# Patient Record
Sex: Female | Born: 1955
Health system: Southern US, Community
[De-identification: ages and names within clinical notes are randomized; demographics above are authoritative.]

## PROBLEM LIST (undated history)

## (undated) ENCOUNTER — Ambulatory Visit: Payer: PPO

## (undated) DIAGNOSIS — F329 Major depressive disorder, single episode, unspecified: Secondary | ICD-10-CM

## (undated) DIAGNOSIS — F419 Anxiety disorder, unspecified: Secondary | ICD-10-CM

## (undated) DIAGNOSIS — C4491 Basal cell carcinoma of skin, unspecified: Secondary | ICD-10-CM

## (undated) DIAGNOSIS — C801 Malignant (primary) neoplasm, unspecified: Secondary | ICD-10-CM

## (undated) DIAGNOSIS — I10 Essential (primary) hypertension: Secondary | ICD-10-CM

## (undated) DIAGNOSIS — K219 Gastro-esophageal reflux disease without esophagitis: Secondary | ICD-10-CM

## (undated) DIAGNOSIS — F32A Depression, unspecified: Secondary | ICD-10-CM

## (undated) DIAGNOSIS — J45909 Unspecified asthma, uncomplicated: Secondary | ICD-10-CM

## (undated) DIAGNOSIS — M549 Dorsalgia, unspecified: Secondary | ICD-10-CM

## (undated) HISTORY — DX: Major depressive disorder, single episode, unspecified: F32.9

## (undated) HISTORY — PX: SHOULDER OPEN ROTATOR CUFF REPAIR: SHX2407

## (undated) HISTORY — PX: ABDOMINAL HYSTERECTOMY: SHX81

## (undated) HISTORY — DX: Depression, unspecified: F32.A

## (undated) HISTORY — PX: TUBAL LIGATION: SHX77

## (undated) HISTORY — DX: Unspecified asthma, uncomplicated: J45.909

## (undated) HISTORY — PX: ARTHROSCOPY WITH ANTERIOR CRUCIATE LIGAMENT (ACL) REPAIR WITH ANTERIOR TIBILIAS GRAFT: SHX6503

## (undated) HISTORY — PX: SHOULDER ARTHROSCOPY W/ ROTATOR CUFF REPAIR: SHX2400

## (undated) HISTORY — PX: FOOT SURGERY: SHX648

## (undated) HISTORY — DX: Basal cell carcinoma of skin, unspecified: C44.91

## (undated) HISTORY — DX: Essential (primary) hypertension: I10

## (undated) HISTORY — PX: BACK SURGERY: SHX140

## (undated) SURGERY — Surgical Case
Anesthesia: *Unknown

---

## 1999-05-24 ENCOUNTER — Encounter: Payer: Self-pay | Admitting: Internal Medicine

## 1999-05-24 ENCOUNTER — Ambulatory Visit (HOSPITAL_COMMUNITY): Admission: RE | Admit: 1999-05-24 | Discharge: 1999-05-24 | Payer: Self-pay | Admitting: Internal Medicine

## 2000-08-02 ENCOUNTER — Other Ambulatory Visit: Admission: RE | Admit: 2000-08-02 | Discharge: 2000-08-02 | Payer: Self-pay | Admitting: Internal Medicine

## 2000-12-03 ENCOUNTER — Emergency Department (HOSPITAL_COMMUNITY): Admission: EM | Admit: 2000-12-03 | Discharge: 2000-12-03 | Payer: Self-pay | Admitting: Emergency Medicine

## 2000-12-20 ENCOUNTER — Other Ambulatory Visit: Admission: RE | Admit: 2000-12-20 | Discharge: 2000-12-20 | Payer: Self-pay | Admitting: Internal Medicine

## 2005-09-05 ENCOUNTER — Ambulatory Visit: Payer: Self-pay | Admitting: Family Medicine

## 2005-10-17 ENCOUNTER — Ambulatory Visit: Payer: Self-pay | Admitting: Family Medicine

## 2005-12-19 ENCOUNTER — Ambulatory Visit: Payer: Self-pay | Admitting: Gastroenterology

## 2005-12-20 ENCOUNTER — Ambulatory Visit: Payer: Self-pay | Admitting: *Deleted

## 2005-12-29 ENCOUNTER — Ambulatory Visit: Payer: Self-pay | Admitting: Gastroenterology

## 2006-01-12 ENCOUNTER — Encounter (INDEPENDENT_AMBULATORY_CARE_PROVIDER_SITE_OTHER): Payer: Self-pay | Admitting: Specialist

## 2006-01-12 ENCOUNTER — Ambulatory Visit: Payer: Self-pay | Admitting: Gastroenterology

## 2006-02-01 ENCOUNTER — Emergency Department (HOSPITAL_COMMUNITY): Admission: EM | Admit: 2006-02-01 | Discharge: 2006-02-01 | Payer: Self-pay | Admitting: Emergency Medicine

## 2006-02-28 ENCOUNTER — Ambulatory Visit: Payer: Self-pay | Admitting: Gastroenterology

## 2006-04-14 ENCOUNTER — Emergency Department (HOSPITAL_COMMUNITY): Admission: EM | Admit: 2006-04-14 | Discharge: 2006-04-14 | Payer: Self-pay | Admitting: Emergency Medicine

## 2007-04-15 ENCOUNTER — Ambulatory Visit: Payer: Self-pay | Admitting: Physical Medicine & Rehabilitation

## 2007-04-17 ENCOUNTER — Encounter (INDEPENDENT_AMBULATORY_CARE_PROVIDER_SITE_OTHER): Payer: Self-pay | Admitting: Orthopedic Surgery

## 2007-04-18 ENCOUNTER — Inpatient Hospital Stay (HOSPITAL_COMMUNITY): Admission: RE | Admit: 2007-04-18 | Discharge: 2007-04-20 | Payer: Self-pay | Admitting: Orthopedic Surgery

## 2007-06-03 ENCOUNTER — Encounter: Admission: RE | Admit: 2007-06-03 | Discharge: 2007-06-21 | Payer: Self-pay | Admitting: Orthopedic Surgery

## 2010-10-04 NOTE — Op Note (Signed)
[name], [name]              ACCOUNT NO.:  [id number]   MEDICAL RECORD NO.:  [id number]          PATIENT TYPE:  AMB   LOCATION:  DAY                          FACILITY:  [facility]   PHYSICIAN:  Alvy Beal, MD    DATE OF BIRTH:  08-Jul-1955   DATE OF PROCEDURE:  04/17/2007  DATE OF DISCHARGE:                               OPERATIVE REPORT   PREOPERATIVE DIAGNOSIS:  Lumbar spinal stenosis L4-5, L5-S1 with L5  synovial cyst with mass effect causing radicular leg pain.   POSTOPERATIVE DIAGNOSIS:  Lumbar spinal stenosis L4-5, L5-S1 with L5  synovial cyst with mass effect causing radicular leg pain.   OPERATIVE PROCEDURE:  L4-5, L5-S1 lumbar decompression with L5-S1  instrumented fusion with spine Frontier facet screws.   SURGEON:  Alvy Beal, MD   FIRST ASSISTANT:  Jene Every, M.D.   COMPLICATIONS:  None.   CONDITION:  Stable.   HISTORY:  She is a very pleasant, 55 year old morbidly obese woman who  is having significant back and bilateral leg pain with the left side  being worse than right.  Attempts at preoperative pain management  consisting of physical therapy and injection therapy have failed, but  she did get relief with the stent injections.  Her MRI demonstrated a  large synovial cyst at L5-S1 and stenosis and moderate stenosis at L4-5.  After discussing treatment options, we elected to proceed with lumbar  decompression and fusion.  All appropriate risks, benefits and  alternatives of surgery were discussed with the patient.  Consent was  obtained.   OPERATIVE NOTE:  The patient was brought to the operating room, placed  supine on the operating table.  After successful induction of general  anesthesia with endotracheal intubation, SCDs and Foley were applied.  The patient was turned prone onto a Wilson frame.  Arms were placed  overhead and the back was prepped and draped in standard fashion.  A  generous midline incision was then made.  Sharp dissection  was carried  out through a very substantial adipose subcutaneous tissue down to the  deep fascia.  The fascia was sharply incised the paraspinal muscles were  stripped using a Cobb elevator to expose the L4-5, L5-S1 laminar spaces.  The deep retractor blades were positioned and the L5 lamina was  identified with the use of fluoroscopy.  Once we confirmed we had the  appropriate level, I then used a double-action Leksell rongeur to resect  the entire L5 spinous process and perform a generous laminectomy  bilaterally.  I also resected some of the L4 spinous process and  performed the laminotomy.  Using a fine curette, I developed a plane  underneath the  ligamentum flavum, dissecting the dura using a  combination of 3 and 4 mm Kerrison.  I resected a thickened ligamentum  flavum and then completed the laminectomy of L5.  I then proceeded  cranially and performed a laminotomy of L4.  Once I had the central  decompression complete, I then went down on the right-hand side to the  lateral recess.  The thecal sac was protected using a 3-mm  Kerrison  rongeur.  I resected the thickened ligamentum flavum in the L4-5, L5-S1  lateral recess and was able to palpate out to the medial or medial wall  of the pedicle.  On the left side, I started L4 and proceeded  inferiorly. I noted to be a very large synovial cyst causing significant  mass effect on the thecal sac as well as the traversing S1 nerve root.  I then developed a plane between the cyst and the facet and resected the  inferior L5 facet.  This allowed me to free up the synovial cyst and I  resected it after carefully dissecting off the thecal sac and nerve  root.  This was sent to pathology.   Once the cyst was decompressed, I then went out onto the neural foramen  at S1,  and freed that up as well.  With a generous bilateral  foraminotomy at L5 completed I was able to take a dural elevator and  pass it along the lateral recess out the neural  foramen and without any  significant compression.  Because the amount of decompression and facet  trauma that was cause while resecting the facet, I elected to instrument  fuse the level.  Using fluoroscopy, I drilled across the sac using a 3.5  drilled and into the S1 pedicle and I decorticated the facet complex and  packed it with regional bone that I harvested from the decompression.   The wound was then copiously irrigated with normal saline.  I then  closed the deep fascia with interrupted #1 Vicryl sutures and a running  0 Vicryl suture and 2-0 Vicryl sutures and staples.  A dry dressing was  applied.  The patient was extubated, transferred to the PACU without  incident.  At the end of the case, all needle, sponge counts were  correct.      Alvy Beal, MD  Electronically Signed     DDB/MEDQ  D:  04/17/2007  T:  04/18/2007  Job:  [id number]

## 2010-10-04 NOTE — Discharge Summary (Signed)
[name], [name]              ACCOUNT NO.:  [id number]   MEDICAL RECORD NO.:  [id number]          PATIENT TYPE:  INP   LOCATION:  1523                         FACILITY:  [facility]   PHYSICIAN:  Alvy Beal, MD    DATE OF BIRTH:  [birth year]   DATE OF ADMISSION:  04/18/2007  DATE OF DISCHARGE:  04/20/2007                               DISCHARGE SUMMARY   ADMISSION DIAGNOSIS:  Lumbar spinal stenosis L4-L5, L5-S1 with L5  synovial cyst with mass effect causing radicular leg pain.   DISCHARGE DIAGNOSIS:  Lumbar spinal stenosis L4-L5, L5-S1 with L5  synovial cyst with mass effect causing radicular leg pain.   SERVICE:  Alvy Beal, MD.   PROCEDURE:  L4-L5, L5-S1 lumbar decompression with L5-S1 instrument  effusion with spine frontier facet screws.   HISTORY:  Ms. [name] is a pleasant 55 year old morbidly obese woman who  was seen in our office and had a significant history of significant back  pain with bilateral leg pain with the left side being worse than the  right.  Attempts at preoperative pain management consisting of physical  therapy and injection therapy had failed.  Her MRI preoperatively  demonstrated a large synovial cyst at the L5-S1 level and stenosis at  the L4-L5 level.  After discussing treatment options, the patient  elected to proceed with lumbar decompression and fusion.  All  appropriate risks, benefits, and alternatives to surgery were discussed  with the patient and consent was obtained.   HOSPITAL COURSE:  The patient tolerated the operative procedure well.  She was transferred from the operating room to the PACU in stable  condition.  She was transferred from the PACU to the floor in stable  condition.  Postoperatively day 1, the patient was ambulating with a  brace.  The patient was discharged home in stable condition on April 20, 2007.   DISCHARGE INSTRUCTIONS:  1. The patient was instructed to follow up in the office in 2 weeks.  2. The  patient was given preprinted discharge instructions.   DISCHARGE MEDICATIONS:  1. Percocet 5/325.  2. Robaxin 500 mg.  3. She continued her current medications of Detrol LA, __________ ,      lisinopril, hydrochlorothiazide, Effexor, __________ .  4. She was informed to stop taking her Celebrex and the Vicodin      10/650s.      Crissie Reese, PA      Alvy Beal, MD  Electronically Signed    AC/MEDQ  D:  05/25/2007  T:  05/25/2007  Job:  [id number]

## 2010-10-07 NOTE — Assessment & Plan Note (Signed)
[name]                           GASTROENTEROLOGY OFFICE NOTE   NAME:[name], [name]                     MRN:          [id number]  DATE:12/19/2005                            DOB:          February 13, 1956    REFERRING PHYSICIAN:  Dr. Gordan Payment at [facility].   PROBLEM:  Lower abdominal pain.   HISTORY:  [name] is a pleasant 55 year old white female who had been seen by  Dr. Jarold Edwards remotely and was diagnosed with irritable bowel syndrome.  She  had undergone colonoscopy in 1997, which was a normal exam.  She had also  had a CT scan during that workup because of complaints of left-sided  abdominal pain and was noted to have some diverticulosis but no evidence of  diverticulitis.   Patient relates that over the past couple of months, she has had fairly  constant left lower quadrant discomfort.  This seems to be increased with  need for bowel movement and also increased somewhat post prandially.  She  complains of bloating and misery after meals.  She has also had long-term  mild constipation but says that this has been somewhat worse over the past  couple of months as well.  She has been using Cascara tablets three daily,  and usually with this will have a bowel movement every other day, although  not always a good bowel movement.  She has not noted any melena or  hematochezia, has had no fevers or chills.  Her weight is up over the past  couple of months, although she reports a decrease in appetite.   Patient also reports intermittent dysphagia with choking.  She says that  sometimes she will choke just with liquids and other times will have  difficulty with solids, feeling as if they are hanging in her esophagus.  She is not having any regurgitation.  Denies any heartburn or indigestion.   CURRENT MEDICATIONS:  1.  Lisinopril 10 p.o. daily.  2.  Effexor XR 150 daily.  3.  Detrol LA 4 mg daily.  4.  Prempro 0.625  daily.   ALLERGIES:  No known drug allergies.   PAST MEDICAL HISTORY:  1.  She is status post hysterectomy and bilateral tubal ligation.  Ovaries      are intact.  2.  Hypertension.  3.  Depression.  4.  Stress urinary incontinence.   FAMILY HISTORY:  A strong family history for colon cancer in three maternal  cousins, one of whom is deceased secondary to colon cancer. All were  diagnosed in their late 70s, early 64s.  Father with heart disease and  diabetes.  One first cousin with ovarian cancer and one first cousin with  breast cancer, all on the maternal side.   SOCIAL HISTORY:  Patient is married.  Has two children.  She is employed in  clerical work.  She is a nonsmoker.  Does drink alcohol socially, perhaps  six beers per week.   REVIEW OF SYSTEMS:  Pertinent for stress urinary incontinence, fatigue, low  back pain, and cough.  GI:  As above.   PHYSICAL EXAMINATION:  VITAL SIGNS:  Height is 5 foot 3.  Weight is 249.  Blood pressure 128/82, pulse 78.  GENERAL:  A well-developed white female in no acute distress.  HEENT:  Atraumatic and normocephalic.  EOMI.  PERRLA.  Sclerae are  anicteric.  PULMONARY:  Clear experienced the onset A&P.  CARDIOVASCULAR:  Regular rate and rhythm with S1 and S2.  ABDOMEN:  Soft, large.  She is tender in the left mid quadrant and left  lower quadrant.  There is no rebound or guarding.  There is fullness in the  left lower quadrant.  Question mass effect.  Bowel sounds are active.  RECTAL:  Heme negative and without mass.   IMPRESSION:  47.  A 55 year old white female with a two month history of left lower      quadrant and constipation, rule out diverticulitis.  Rule out occult      colon lesion.  2.  Positive family history of colon cancer, maternal side.  3.  Dysphagia, intermittent, rule out stricture.   PLAN:  1.  Schedule CT scan of the abdomen and pelvis with contrast.  2.  Start Flagyl 500 b.i.d. and Cipro 500 mg b.i.d. x10 days to  treat for      possible diverticulitis.  3.  Schedule colonoscopy in 2-3 weeks so that she will have time to complete      a course of antibiotics and CT scan can be reviewed.  Will also schedule      simultaneous EGD and possible dilation.                                   Amy Esterwood, PA-C                                Karina Rea. Jarold Motto, MD, Clementeen Graham, FACP   AE/MedQ  DD:  12/19/2005  DT:  12/19/2005  Job #:  [id number]   cc:   Dr. Gordan Payment

## 2011-02-28 LAB — BASIC METABOLIC PANEL
BUN: 13
BUN: 8
CO2: 27
Chloride: 103
Chloride: 105
Creatinine, Ser: 0.76
GFR calc Af Amer: >60
GFR calc Af Amer: >60
GFR calc non Af Amer: >60
Glucose, Bld: 153 — ABNORMAL HIGH
Glucose, Bld: 92
Potassium: 4
Potassium: 4.2
Sodium: 136
Sodium: 143

## 2011-02-28 LAB — CBC
Hemoglobin: 10.6 — ABNORMAL LOW
MCV: 95.7
RDW: 14.5

## 2011-02-28 LAB — HEMOGLOBIN AND HEMATOCRIT, BLOOD: HCT: 41.1

## 2012-05-29 DIAGNOSIS — K760 Fatty (change of) liver, not elsewhere classified: Secondary | ICD-10-CM | POA: Insufficient documentation

## 2014-07-21 DIAGNOSIS — Z79890 Hormone replacement therapy (postmenopausal): Secondary | ICD-10-CM | POA: Insufficient documentation

## 2015-04-01 DIAGNOSIS — M5090 Cervical disc disorder, unspecified, unspecified cervical region: Secondary | ICD-10-CM | POA: Insufficient documentation

## 2015-04-01 DIAGNOSIS — E785 Hyperlipidemia, unspecified: Secondary | ICD-10-CM | POA: Insufficient documentation

## 2015-04-01 DIAGNOSIS — F329 Major depressive disorder, single episode, unspecified: Secondary | ICD-10-CM | POA: Insufficient documentation

## 2015-04-01 DIAGNOSIS — M519 Unspecified thoracic, thoracolumbar and lumbosacral intervertebral disc disorder: Secondary | ICD-10-CM | POA: Insufficient documentation

## 2015-04-01 DIAGNOSIS — I10 Essential (primary) hypertension: Secondary | ICD-10-CM | POA: Insufficient documentation

## 2015-04-01 DIAGNOSIS — F32A Depression, unspecified: Secondary | ICD-10-CM | POA: Insufficient documentation

## 2015-04-01 DIAGNOSIS — H269 Unspecified cataract: Secondary | ICD-10-CM | POA: Insufficient documentation

## 2017-03-19 DIAGNOSIS — M7522 Bicipital tendinitis, left shoulder: Secondary | ICD-10-CM | POA: Insufficient documentation

## 2017-03-19 DIAGNOSIS — M25512 Pain in left shoulder: Secondary | ICD-10-CM | POA: Insufficient documentation

## 2017-03-19 DIAGNOSIS — E663 Overweight: Secondary | ICD-10-CM | POA: Insufficient documentation

## 2017-08-15 ENCOUNTER — Emergency Department (INDEPENDENT_AMBULATORY_CARE_PROVIDER_SITE_OTHER): Payer: BLUE CROSS/BLUE SHIELD

## 2017-08-15 ENCOUNTER — Other Ambulatory Visit: Payer: Self-pay

## 2017-08-15 ENCOUNTER — Emergency Department
Admission: EM | Admit: 2017-08-15 | Discharge: 2017-08-15 | Disposition: A | Discharge status: Home / Self Care | Payer: BLUE CROSS/BLUE SHIELD | Source: Home / Self Care | Attending: Family Medicine | Admitting: Family Medicine

## 2017-08-15 ENCOUNTER — Encounter: Payer: Self-pay | Admitting: *Deleted

## 2017-08-15 DIAGNOSIS — R05 Cough: Secondary | ICD-10-CM

## 2017-08-15 DIAGNOSIS — R062 Wheezing: Secondary | ICD-10-CM | POA: Diagnosis not present

## 2017-08-15 DIAGNOSIS — J9801 Acute bronchospasm: Secondary | ICD-10-CM

## 2017-08-15 DIAGNOSIS — R0600 Dyspnea, unspecified: Secondary | ICD-10-CM

## 2017-08-15 DIAGNOSIS — B9789 Other viral agents as the cause of diseases classified elsewhere: Secondary | ICD-10-CM | POA: Diagnosis not present

## 2017-08-15 DIAGNOSIS — J069 Acute upper respiratory infection, unspecified: Secondary | ICD-10-CM | POA: Diagnosis not present

## 2017-08-15 HISTORY — DX: Dorsalgia, unspecified: M54.9

## 2017-08-15 HISTORY — DX: Anxiety disorder, unspecified: F41.9

## 2017-08-15 HISTORY — DX: Gastro-esophageal reflux disease without esophagitis: K21.9

## 2017-08-15 MED ORDER — IPRATROPIUM-ALBUTEROL 0.5-2.5 (3) MG/3ML IN SOLN
3.0000 mL | Freq: Once | RESPIRATORY_TRACT | Status: AC
  Administered 2017-08-15: 3 mL via RESPIRATORY_TRACT

## 2017-08-15 MED ORDER — PREDNISONE 20 MG PO TABS: ORAL_TABLET | ORAL | 0 refills | Status: DC

## 2017-08-15 MED ORDER — ALBUTEROL SULFATE HFA 108 (90 BASE) MCG/ACT IN AERS: 2.0000 | INHALATION_SPRAY | Freq: Four times a day (QID) | RESPIRATORY_TRACT | 1 refills | Status: DC | PRN

## 2017-08-15 MED ORDER — AZITHROMYCIN 250 MG PO TABS: ORAL_TABLET | ORAL | 0 refills | Status: DC

## 2017-08-15 MED ORDER — BENZONATATE 200 MG PO CAPS: ORAL_CAPSULE | ORAL | 0 refills | Status: DC

## 2017-08-15 NOTE — ED Provider Notes (Signed)
[name]    CSN: [id number] Arrival date & time: 08/15/17  1134     History   Chief Complaint Chief Complaint  Patient presents with  . Cough    HPI [name] is a 62 y.o. female.   Patient suddenly developed a productive cough two days ago with mild sore throat and minimal sinus congestion.  She has had night sweats during the past three days.  She also recalls that about two weeks ago she had night sweats that last a week.  She notes that she often coughs until she gags, and has had increased wheezing.  She does not recall her last Tdap.  She points out that she had a similar illness 5 months ago, and another two months ago, both lasting a week.  During her last episode she developed wheezing that improved with an albuterol inhaler.   She complains of approximately 6 month history of nocturnal cough and wheezing that awakens her at night.  She notes that she has good exercise tolerance and is able exercise on a treadmill without shortness of breath or wheezing.  She also recalls that when she lost some weight, she was able to sleep without difficulty.  No history of smoking.  She is adopted and notes that both her mother and father died of lung cancer (both smokers).  She recalls that among her birth relatives, there are numerous people with asthma.  The history is provided by the patient.    Past Medical History:  Diagnosis Date  . Anxiety   . Back pain   . GERD (gastroesophageal reflux disease)     There are no active problems to display for this patient.   Past Surgical History:  Procedure Laterality Date  . ABDOMINAL HYSTERECTOMY    . BACK SURGERY      OB History   None      Home Medications    Prior to Admission medications   Medication Sig Start Date End Date Taking? Authorizing Provider  buPROPion (WELLBUTRIN XL) 300 MG 24 hr tablet  08/19/14  Yes [provider]  celecoxib (CELEBREX) 200 MG capsule  06/26/14  Yes [provider]  pregabalin (LYRICA) 50 MG capsule  03/26/17  Yes [provider]  albuterol (PROVENTIL HFA;VENTOLIN HFA) 108 (90 Base) MCG/ACT inhaler Inhale 2 puffs into the lungs every 6 (six) hours as needed for wheezing or shortness of breath. 08/15/17   Kandra Nicolas, MD  azithromycin (ZITHROMAX Z-PAK) 250 MG tablet Take 2 tabs today; then begin one tab once daily for 4 more days. 08/15/17   Kandra Nicolas, MD  benzonatate (TESSALON) 200 MG capsule Take one cap by mouth at bedtime as needed for cough.  May repeat in 4 to 6 hours 08/15/17   Kandra Nicolas, MD  omeprazole (PRILOSEC) 40 MG capsule  08/04/17   [provider]  predniSONE (DELTASONE) 20 MG tablet Take one tab by mouth twice daily for 4 days, then one daily for 3 days. Take with food. 08/15/17   Kandra Nicolas, MD    Family History Family History  Problem Relation Age of Onset  . Cancer Mother        lung  . Cancer Father        lung  . Hypertension Father   . Diabetes Father   . Non-Hodgkin's lymphoma Son     Social History Social History   Tobacco Use  . Smoking status: Passive Smoke Exposure -  Never Smoker  . Smokeless tobacco: Never Used  Substance Use Topics  . Alcohol use: Yes  . Drug use: Never     Allergies   Patient has no known allergies.   Review of Systems Review of Systems + sore throat + cough No pleuritic pain + wheezing ? nasal congestion + post-nasal drainage No sinus pain/pressure No itchy/red eyes No earache No hemoptysis + SOB at night ? fever, + chills/sweats No nausea No vomiting No abdominal pain No diarrhea No urinary symptoms No skin rash + fatigue No myalgias No headache Used OTC meds without relief   Physical Exam Triage Vital Signs ED Triage Vitals [08/15/17 1207]  Enc Vitals Group     BP (!) 144/98     Pulse Rate 87     Resp 16     Temp 97.7 F (36.5 C)     Temp Source Oral     SpO2 97 %     Weight 241 lb (109.3 kg)     Height  5\' 4"  (1.626 m)     Head Circumference      Peak Flow      Pain Score 0     Pain Loc      Pain Edu?      Excl. in [place]?    No data found.  Updated Vital Signs BP (!) 144/98 (BP Location: Right Arm)   Pulse 87   Temp 97.7 F (36.5 C) (Oral)   Resp 16   Ht 5\' 4"  (1.626 m)   Wt 241 lb (109.3 kg)   SpO2 97%   BMI 41.37 kg/m   Visual Acuity Right Eye Distance:   Left Eye Distance:   Bilateral Distance:    Right Eye Near:   Left Eye Near:    Bilateral Near:     Physical Exam Nursing notes and Vital Signs reviewed. Appearance:  Patient appears stated age, and in no acute distress Eyes:  Pupils are equal, round, and reactive to light and accomodation.  Extraocular movement is intact.  Conjunctivae are not inflamed  Ears:  Canals normal.  Tympanic membranes normal.  Nose:  Congested turbinates.  No sinus tenderness.    Pharynx:  Normal Neck:  Supple.  Enlarged posterior/lateral nodes are palpated bilaterally, tender to palpation on the left.   Lungs:  Bilateral faint expiratory wheezes.  Breath sounds are equal.  Moving air well.  Post DuoNeb nebulizer treatment, patient reports symptomatic improvement. Heart:  Regular rate and rhythm without murmurs, rubs, or gallops.  Abdomen:  Nontender without masses or hepatosplenomegaly.  Bowel sounds are present.  No CVA or flank tenderness.  Extremities:  Trace lower leg edema.  Skin:  No rash present.    UC Treatments / Results  Labs (all labs ordered are listed, but only abnormal results are displayed) Labs Reviewed - No data to display  EKG None Radiology Dg Chest 2 View  Result Date: 08/15/2017 CLINICAL DATA:  Recurrent cough for the past 3 weeks.  Wheezing. EXAM: CHEST - 2 VIEW COMPARISON:  Chest x-ray dated April 16, 2007. FINDINGS: The heart size and mediastinal contours are within normal limits. Both lungs are clear. The visualized skeletal structures are unremarkable. IMPRESSION: No active cardiopulmonary disease.  Electronically Signed   By: Titus Dubin M.D.   On: 08/15/2017 12:51    Procedures Procedures (including critical care time)  Medications Ordered in UC Medications  ipratropium-albuterol (DUONEB) 0.5-2.5 (3) MG/3ML nebulizer solution 3 mL (3 mLs Nebulization Given 08/15/17 1315)  Initial Impression / Assessment and Plan / UC Course  I have reviewed the triage vital signs and the nursing notes.  Pertinent labs & imaging results that were available during my care of the patient were reviewed by me and considered in my medical decision making (see chart for details).    Negative chest X-ray reassuring.   Because patient is not current on her Tdap, will begin Z-pak for atypical coverage. Begin prednisone burst/taper.  Refill Rx for albuterol MDO. Prescription written for Benzonatate [facility]) to take at bedtime for night-time cough.  Take plain guaifenesin (1200mg  extended release tabs such as Mucinex) twice daily, with plenty of water, for cough and congestion.  May add Pseudoephedrine (30mg , one or two every 4 to 6 hours) if needed for sinus congestion.  Get adequate rest.   Try warm salt water gargles for sore throat.  Stop all antihistamines for now, and other non-prescription cough/cold preparations. May take Delsym Cough Suppressant with Tessalon at bedtime for nighttime cough.  Recommend a Tdap (tetanus shot) when well.   Patient does not have evidence of CHF causing her nocturnal dyspnea; she is able to exercise daytime without difficulty.  Suspect OSA:  She recalls that she slept better when she lost weight.  Recommend screening for OSA.    Final Clinical Impressions(s) / UC Diagnoses   Final diagnoses:  Viral URI with cough  Bronchospasm  Nocturnal dyspnea    ED Discharge Orders        Ordered    predniSONE (DELTASONE) 20 MG tablet     08/15/17 1332    azithromycin (ZITHROMAX Z-PAK) 250 MG tablet     08/15/17 1332    benzonatate (TESSALON) 200 MG capsule      08/15/17 1332    albuterol (PROVENTIL HFA;VENTOLIN HFA) 108 (90 Base) MCG/ACT inhaler  Every 6 hours PRN     08/15/17 1332           Kandra Nicolas, MD 08/15/17 1343

## 2017-08-15 NOTE — Discharge Instructions (Signed)
Take plain guaifenesin (1200mg  extended release tabs such as Mucinex) twice daily, with plenty of water, for cough and congestion.  May add Pseudoephedrine (30mg , one or two every 4 to 6 hours) if needed for sinus congestion.  Get adequate rest.   Try warm salt water gargles for sore throat.  Stop all antihistamines for now, and other non-prescription cough/cold preparations. May take Delsym Cough Suppressant with Tessalon at bedtime for nighttime cough.  Recommend a Tdap (tetanus shot) when well.

## 2017-08-15 NOTE — ED Triage Notes (Signed)
Pt c/o productive cough and SOB x 2 days. She had similar episodes in 9/18' and 1/19' was given inhaler and cough syrup for bronchitis from [facility]. Reports a family hx of COPD; she has never been a smoker, but reports a lot of second hand smoke as a child.

## 2017-11-20 DIAGNOSIS — R6889 Other general symptoms and signs: Secondary | ICD-10-CM | POA: Insufficient documentation

## 2018-02-01 ENCOUNTER — Ambulatory Visit: Payer: BLUE CROSS/BLUE SHIELD | Admitting: Osteopathic Medicine

## 2018-02-01 ENCOUNTER — Telehealth: Payer: Self-pay | Admitting: Family Medicine

## 2018-02-01 ENCOUNTER — Ambulatory Visit (INDEPENDENT_AMBULATORY_CARE_PROVIDER_SITE_OTHER): Payer: BLUE CROSS/BLUE SHIELD

## 2018-02-01 ENCOUNTER — Encounter (HOSPITAL_BASED_OUTPATIENT_CLINIC_OR_DEPARTMENT_OTHER): Payer: Self-pay

## 2018-02-01 ENCOUNTER — Ambulatory Visit (HOSPITAL_BASED_OUTPATIENT_CLINIC_OR_DEPARTMENT_OTHER)
Admission: RE | Admit: 2018-02-01 | Discharge: 2018-02-01 | Disposition: A | Discharge status: Home / Self Care | Payer: BLUE CROSS/BLUE SHIELD | Source: Ambulatory Visit | Attending: Osteopathic Medicine | Admitting: Osteopathic Medicine

## 2018-02-01 ENCOUNTER — Encounter: Payer: Self-pay | Admitting: Osteopathic Medicine

## 2018-02-01 VITALS — BP 135/86 | HR 90 | Temp 97.8°F | Ht 64.0 in | Wt 248.0 lb

## 2018-02-01 DIAGNOSIS — J9811 Atelectasis: Secondary | ICD-10-CM | POA: Diagnosis not present

## 2018-02-01 DIAGNOSIS — M5090 Cervical disc disorder, unspecified, unspecified cervical region: Secondary | ICD-10-CM | POA: Insufficient documentation

## 2018-02-01 DIAGNOSIS — R9389 Abnormal findings on diagnostic imaging of other specified body structures: Secondary | ICD-10-CM

## 2018-02-01 DIAGNOSIS — R0609 Other forms of dyspnea: Secondary | ICD-10-CM | POA: Diagnosis not present

## 2018-02-01 DIAGNOSIS — R0602 Shortness of breath: Secondary | ICD-10-CM

## 2018-02-01 DIAGNOSIS — Z13220 Encounter for screening for lipoid disorders: Secondary | ICD-10-CM

## 2018-02-01 DIAGNOSIS — R0600 Dyspnea, unspecified: Secondary | ICD-10-CM

## 2018-02-01 DIAGNOSIS — Z6838 Body mass index (BMI) 38.0-38.9, adult: Secondary | ICD-10-CM | POA: Insufficient documentation

## 2018-02-01 DIAGNOSIS — Z6841 Body Mass Index (BMI) 40.0 and over, adult: Secondary | ICD-10-CM

## 2018-02-01 DIAGNOSIS — F419 Anxiety disorder, unspecified: Secondary | ICD-10-CM | POA: Insufficient documentation

## 2018-02-01 LAB — COMPLETE METABOLIC PANEL WITH GFR
AG Ratio: 1.5 (calc) (ref 1.0–2.5)
ALBUMIN MSPROF: 4.1 g/dL (ref 3.6–5.1)
ALT: 18 U/L (ref 6–29)
AST: 25 U/L (ref 10–35)
Alkaline phosphatase (APISO): 63 U/L (ref 33–130)
BILIRUBIN TOTAL: 0.6 mg/dL (ref 0.2–1.2)
BUN / CREAT RATIO: 13 (calc) (ref 6–22)
BUN: 15 mg/dL (ref 7–25)
CHLORIDE: 105 mmol/L (ref 98–110)
CO2: 27 mmol/L (ref 20–32)
CREATININE: 1.16 mg/dL — AB (ref 0.50–0.99)
Calcium: 9.3 mg/dL (ref 8.6–10.4)
GFR, EST AFRICAN AMERICAN: 58 mL/min/{1.73_m2} — AB (ref >=60)
GFR, EST NON AFRICAN AMERICAN: 50 mL/min/{1.73_m2} — AB (ref >=60)
GLUCOSE: 95 mg/dL (ref 65–99)
Globulin: 2.8 g/dL (calc) (ref 1.9–3.7)
Potassium: 4.6 mmol/L (ref 3.5–5.3)
Sodium: 140 mmol/L (ref 135–146)
TOTAL PROTEIN: 6.9 g/dL (ref 6.1–8.1)

## 2018-02-01 LAB — CBC
HCT: 44 % (ref 35.0–45.0)
HEMOGLOBIN: 14.7 g/dL (ref 11.7–15.5)
MCH: 32.2 pg (ref 27.0–33.0)
MCHC: 33.4 g/dL (ref 32.0–36.0)
MCV: 96.3 fL (ref 80.0–100.0)
MPV: 9.9 fL (ref 7.5–12.5)
Platelets: 311 10*3/uL (ref 140–400)
RBC: 4.57 10*6/uL (ref 3.80–5.10)
RDW: 12 % (ref 11.0–15.0)
WBC: 5.2 10*3/uL (ref 3.8–10.8)

## 2018-02-01 LAB — BRAIN NATRIURETIC PEPTIDE: BRAIN NATRIURETIC PEPTIDE: 26 pg/mL (ref <100)

## 2018-02-01 LAB — LIPID PANEL
CHOL/HDL RATIO: 2.5 (calc) (ref <5.0)
Cholesterol: 215 mg/dL — ABNORMAL HIGH (ref <200)
HDL: 87 mg/dL (ref >50)
LDL CHOLESTEROL (CALC): 111 mg/dL — AB
NON-HDL CHOLESTEROL (CALC): 128 mg/dL (ref <130)
TRIGLYCERIDES: 78 mg/dL (ref <150)

## 2018-02-01 LAB — TSH: TSH: 2.21 mIU/L (ref 0.40–4.50)

## 2018-02-01 MED ORDER — IOPAMIDOL (ISOVUE-300) INJECTION 61%
100.0000 mL | Freq: Once | INTRAVENOUS | Status: AC | PRN
  Administered 2018-02-01: 80 mL via INTRAVENOUS

## 2018-02-01 NOTE — Progress Notes (Signed)
HPI: [name] is a 62 y.o. female who  has a past medical history of Anxiety, Back pain, and GERD (gastroesophageal reflux disease).  she presents to [facility] today, 02/01/18,  for chief complaint of: New to establish Breathing issues  Pleasant new patient here to establish care. Recently moved here form [place], [place].   Recently started back at the gym, feels like she can't get a good deep breath in, sometime in mid-sentence. Reports SOB. Reports wheezing at night, per husband. Son has also noticed this. Never smoker. Siblings have COPD, and her mom and dad had lung cancer but were smokers.   Shoulder: following with ortho.   Postmenopausal: on estradiol.    Past medical, surgical, social and family history reviewed:  Patient Active Problem List   Diagnosis Date Noted  . Anxiety 02/01/2018  . Cervical neck pain with evidence of disc disease 02/01/2018  . BMI 40.0-44.9, adult ([place]) 02/01/2018  . Cataract 04/01/2015  . Depression 04/01/2015  . Hyperlipidemia 04/01/2015  . Hypertension 04/01/2015  . Disorder of intervertebral disc of cervical spine 04/01/2015  . Lumbar disc disease 04/01/2015  . Hormone replacement therapy (postmenopausal) 07/21/2014  . Fatty (change of) liver, not elsewhere classified 05/29/2012   Past Surgical History:  Procedure Laterality Date  . ABDOMINAL HYSTERECTOMY    . BACK SURGERY    . SHOULDER ARTHROSCOPY W/ ROTATOR CUFF REPAIR     Social History   Tobacco Use  . Smoking status: Passive Smoke Exposure - Never Smoker  . Smokeless tobacco: Never Used  Substance Use Topics  . Alcohol use: Yes    Family History  Problem Relation Age of Onset  . Cancer Mother        lung  . Cancer Father        lung  . Hypertension Father   . Diabetes Father   . Non-Hodgkin's lymphoma Son      Current medication list and allergy/intolerance information reviewed:    Current Outpatient Medications  Medication  Sig Dispense Refill  . albuterol (PROVENTIL HFA;VENTOLIN HFA) 108 (90 Base) MCG/ACT inhaler Inhale 2 puffs into the lungs every 6 (six) hours as needed for wheezing or shortness of breath. 1 Inhaler 1  . buPROPion (WELLBUTRIN XL) 300 MG 24 hr tablet Take by mouth daily.     . celecoxib (CELEBREX) 200 MG capsule Take 200 mg by mouth 2 (two) times daily.     . DULoxetine (CYMBALTA) 60 MG capsule Take 60 mg by mouth daily.    Marland Kitchen estradiol (ESTRACE) 1 MG tablet Take 1 mg by mouth daily.    Marland Kitchen omeprazole (PRILOSEC) 40 MG capsule Take 40 mg by mouth daily.     . pregabalin (LYRICA) 50 MG capsule Take 50 mg by mouth 3 (three) times daily.      No current facility-administered medications for this visit.     No Known Allergies    Review of Systems:  Constitutional:  No  fever, no chills, No recent illness, No unintentional weight changes. No significant fatigue.   HEENT: No  headache, no vision change, no hearing change, No sore throat, No  sinus pressure  Cardiac: No  chest pain, No  pressure, No palpitations, No  Orthopnea  Respiratory:  +shortness of breath. No  Cough  Gastrointestinal: No  abdominal pain, No  nausea, No  vomiting,  No  blood in stool, No  diarrhea, No  constipation   Musculoskeletal: No new myalgia/arthralgia  Skin: No  Rash, No other wounds/concerning lesions  Genitourinary: No  incontinence, No  abnormal genital bleeding, No abnormal genital discharge  Hem/Onc: No  easy bruising/bleeding, No  abnormal lymph node  Endocrine: No cold intolerance,  No heat intolerance. No polyuria/polydipsia/polyphagia   Neurologic: No  weakness, No  dizziness, No  slurred speech/focal weakness/facial droop  Psychiatric: No  concerns with depression, No  concerns with anxiety, No sleep problems, No mood problems  Exam:  BP 135/86   Pulse 90   Temp 97.8 F (36.6 C) (Oral)   Ht 5\' 4"  (1.626 m)   Wt 248 lb (112.5 kg)   SpO2 93%   BMI 42.57 kg/m   Constitutional: VS see  above. General Appearance: alert, well-developed, well-nourished, NAD  Eyes: Normal lids and conjunctive, non-icteric sclera  Ears, Nose, Mouth, Throat: MMM, Normal external inspection ears/nares/mouth/lips/gums.   Neck: No masses, trachea midline. No thyroid enlargement. No tenderness/mass appreciated. No lymphadenopathy  Respiratory: Normal respiratory effort. no wheeze, no rhonchi, no rales  Cardiovascular: S1/S2 normal, no murmur, no rub/gallop auscultated. RRR. No lower extremity edema.  Musculoskeletal: Gait normal. No clubbing/cyanosis of digits.   Neurological: Normal balance/coordination. No tremor.   Skin: warm, dry, intact. No rash/ulcer.  Psychiatric: Normal judgment/insight. Normal mood and affect. Oriented x3.    Chest x-ray personally reviewed, some elevated hemidiaphragm on the right and certainly double right lower lobe consolidation/pneumonia or atelectasis or pulmonary edema, normal fluid levels of effusion or not really noted.  Await radiology over read, and will assess further with CT scan.   No results found for this or any previous visit (from the past 72 hour(s)).  No results found.      ASSESSMENT/PLAN: The primary encounter diagnosis was Shortness of breath. Diagnoses of BMI 40.0-44.9, adult ([place]), Abnormal chest x-ray, Dyspnea on exertion, and Lipid screening were also pertinent to this visit.  Orders Placed This Encounter  Procedures  . DG Chest 2 View  . CT CHEST W WO CONTRAST  . CBC  . COMPLETE METABOLIC PANEL WITH GFR  . TSH  . Lipid panel  . B Nat Peptide     Await radiology over read for chest x-ray/CT scan.  Not sure what to make of the lung findings on the right side, may end up needing thoracentesis versus diuretics versus antibiotics, if any mass would certainly consider bronchoscopy, may pursue a pulmonology referral either way.  Patient states the inhalers tend to help, there may be a certain asthma component.    Uncertain  respiratory inhalant exposure occupational history.  She really does not have any fever or significant cough or fatigue/systemic symptoms that would lead me to think of a pneumonia.  She has no personal smoking history, so lung cancer/COPD seems less likely.  She has no chest pain or lower extremity swelling or orthopnea to concern for CHF, though this is certainly in the differential.      Patient Instructions  Plan:  Given abnormal chest x-ray, I am not positive whether this is a consolidation within the lung tissue itself, fluid on the lung that can be drained, infection, or effects of mass or cancer.  Given that you do not have a history of smoking, cancer seems unlikely, but this is of course something that we are going to rule out  I think we need to get a CT scan to further evaluate. If fluid needs to be drained from the lung, we will analyze that fluid to determine what might be causing it.  If  the CT indicates more a mass or infection, we will pursue that accordingly with a specialist referral, antibiotics, whenever we need to do.  We will also get some blood work which may help determine what is going on.  If you develop severe shortness of breath, especially if there is also chest pain, fever, passing out, lightheadedness, or other concerns please seek emergency medical attention.  It's okay to continue to use your inhaler.  Depending on CT results, I may call in other prescriptions, so keep your phone on you today!     Visit summary with medication list and pertinent instructions was printed for patient to review. All questions at time of visit were answered - patient instructed to contact office with any additional concerns. ER/RTC precautions were reviewed with the patient.   Follow-up plan: Return for recheck next week, review results and next steps .    Please note: voice recognition software was used to produce this document, and typos may escape review. Please contact  Dr. Sheppard Coil for any needed clarifications.

## 2018-02-01 NOTE — Telephone Encounter (Signed)
Pt seen by Dr Sheppard Coil in office today.  OV read in full.  Not acutely SOB per note ( appears pt has had increasing with activity over time) and nothing noted about acutely decompensated state during OV.  CXR in office done today earlier- reviewed by Dr Sheppard Coil herself- showed R HD elevated on R with consolidation.  This is corroborated on an apparent stat CT scan as well which showed "right lung demonstrates some mild right middle lobe and lower lobe atelectasis related to the significant elevation of the right hemidiaphragm".  "No focal ABN noted, no pleural effusion or cardiac enlargement or effusion noted".     Pt was told by Dr Sheppard Coil during [facility] "If you develop severe shortness of breath, especially if there is also chest pain, fever, passing out, lightheadedness, or other concerns please seek emergency medical attention".   Will send message to Dr A for her review and determination of further OP w/up in future.  Diego Cory Reginald Weida, DO 6:01pm 02/01/18

## 2018-02-01 NOTE — Telephone Encounter (Signed)
Thanks! I got called about this and spoke to the patient!

## 2018-02-01 NOTE — Patient Instructions (Addendum)
Plan:  Given abnormal chest x-ray, I am not positive whether this is a consolidation within the lung tissue itself, fluid on the lung that can be drained, infection, or effects of mass or cancer.  Given that you do not have a history of smoking, cancer seems unlikely, but this is of course something that we are going to rule out  I think we need to get a CT scan to further evaluate. If fluid needs to be drained from the lung, we will analyze that fluid to determine what might be causing it.  If the CT indicates more a mass or infection, we will pursue that accordingly with a specialist referral, antibiotics, whenever we need to do.  We will also get some blood work which may help determine what is going on.  If you develop severe shortness of breath, especially if there is also chest pain, fever, passing out, lightheadedness, or other concerns please seek emergency medical attention.  It's okay to continue to use your inhaler.  Depending on CT results, I may call in other prescriptions, so keep your phone on you today!

## 2018-02-05 ENCOUNTER — Ambulatory Visit (INDEPENDENT_AMBULATORY_CARE_PROVIDER_SITE_OTHER): Payer: BLUE CROSS/BLUE SHIELD

## 2018-02-05 ENCOUNTER — Ambulatory Visit: Payer: BLUE CROSS/BLUE SHIELD | Admitting: Osteopathic Medicine

## 2018-02-05 ENCOUNTER — Encounter: Payer: Self-pay | Admitting: Osteopathic Medicine

## 2018-02-05 VITALS — BP 142/98 | HR 89 | Temp 98.1°F | Wt 248.6 lb

## 2018-02-05 DIAGNOSIS — J9811 Atelectasis: Secondary | ICD-10-CM | POA: Insufficient documentation

## 2018-02-05 DIAGNOSIS — J986 Disorders of diaphragm: Secondary | ICD-10-CM

## 2018-02-05 NOTE — Progress Notes (Signed)
HPI: [name] is a 62 y.o. female who  has a past medical history of Anxiety, Back pain, Depression, GERD (gastroesophageal reflux disease), and Hypertension.  she presents to [facility] today, 02/05/18,  for chief complaint of:  Follow-up shortness of breath  Patient is here today to follow-up on shortness of breath after last week diagnosis with significant atelectasis and elevated hemidiaphragm on the right.  On further record review, looks like the diaphragm has been elevated going as far back as 2007.  Patient has been less active since retiring and has noticed some increasing shortness of breath worsening since her recent fall, at which point she presented to the clinic and we obtained the x-ray and the CT as noted below.  She has been doing incentive spirometry over the weekend, has not noticed a whole lot of difference as far as breathing is concerned.  She notices much more shortness of breath when lying supine.  BNP was normal, no other significant concerns on blood work.     Past medical history, surgical history, and family history reviewed.  Current medication list and allergy/intolerance information reviewed.   (See remainder of HPI, ROS, Phys Exam below)  Dg Chest 2 View  Result Date: 02/01/2018 CLINICAL DATA:  Shortness of breath. EXAM: CHEST - 2 VIEW COMPARISON:  Radiographs of August 15, 2017. FINDINGS: The heart size and mediastinal contours are within normal limits. No pneumothorax or pleural effusion is noted. Left lung is clear. Stable elevated right hemidiaphragm is noted with mild right basilar subsegmental atelectasis. The visualized skeletal structures are unremarkable. IMPRESSION: Stable elevated right hemidiaphragm with mild right basilar subsegmental atelectasis. Electronically Signed   By: Marijo Conception, M.D.   On: 02/01/2018 15:08   Ct Chest W Contrast  Result Date: 02/01/2018 CLINICAL DATA:  Shortness of breath, abnormal  chest x-ray EXAM: CT CHEST WITH CONTRAST TECHNIQUE: Multidetector CT imaging of the chest was performed during intravenous contrast administration. CONTRAST:  73mL ISOVUE-300 IOPAMIDOL (ISOVUE-300) INJECTION 61% COMPARISON:  Chest x-ray from earlier in the same day. FINDINGS: Cardiovascular: Thoracic aorta is within normal limits. No cardiac enlargement is seen. Pulmonary artery shows no large central embolus although timing was not performed for embolus evaluation. Mediastinum/Nodes: Thoracic inlet is within normal limits. No hilar or mediastinal adenopathy is noted. The esophagus is within normal limits. Lungs/Pleura: The left lung is well aerated without evidence of focal infiltrate or sizable effusion. No parenchymal nodules are noted. The right lung demonstrates some mild right middle lobe and lower lobe atelectasis related to the significant elevation of the right hemidiaphragm. These changes are new from prior chest x-rays from 08/15/2017 but correspond with the findings of today's x-ray. No other focal abnormality is noted. No sizable effusion is seen. Upper Abdomen: Significant elevation of the right hemidiaphragm is identified. Both liver and colon are noted beneath the diaphragm. Musculoskeletal: Degenerative changes of the thoracic spine are noted. No acute rib abnormality is seen. IMPRESSION: Right middle and lower lobe atelectasis likely related to significant right hemidiaphragm elevation. These changes are new from prior plain film dated 08/15/2017 but correspond with those seen on recent chest x-ray. No effusion is noted. No other focal abnormality is seen. These results will be called to the ordering clinician or representative by the Radiologist Assistant, and communication documented in the PACS or zVision Dashboard. Electronically Signed   By: Inez Catalina M.D.   On: 02/01/2018 16:32    Recent Results (from the past 2160  hour(s))  CBC     Status: None   Collection Time: 02/01/18 12:05 PM   Result Value Ref Range   WBC 5.2 3.8 - 10.8 Thousand/uL   RBC 4.57 3.80 - 5.10 Million/uL   Hemoglobin 14.7 11.7 - 15.5 g/dL   HCT 44.0 35.0 - 45.0 %   MCV 96.3 80.0 - 100.0 fL   MCH 32.2 27.0 - 33.0 pg   MCHC 33.4 32.0 - 36.0 g/dL   RDW 12.0 11.0 - 15.0 %   Platelets 311 140 - 400 Thousand/uL   MPV 9.9 7.5 - 12.5 fL  COMPLETE METABOLIC PANEL WITH GFR     Status: Abnormal   Collection Time: 02/01/18 12:05 PM  Result Value Ref Range   Glucose, Bld 95 65 - 99 mg/dL    Comment: .            Fasting reference interval .    BUN 15 7 - 25 mg/dL   Creat 1.16 (H) 0.50 - 0.99 mg/dL    Comment: For patients >69 years of age, the reference limit for Creatinine is approximately 13% higher for people identified as African-American. .    GFR, Est Non African American 50 (L) > OR = 60 mL/min/1.53m2   GFR, Est African American 58 (L) > OR = 60 mL/min/1.69m2   BUN/Creatinine Ratio 13 6 - 22 (calc)   Sodium 140 135 - 146 mmol/L   Potassium 4.6 3.5 - 5.3 mmol/L   Chloride 105 98 - 110 mmol/L   CO2 27 20 - 32 mmol/L   Calcium 9.3 8.6 - 10.4 mg/dL   Total Protein 6.9 6.1 - 8.1 g/dL   Albumin 4.1 3.6 - 5.1 g/dL   Globulin 2.8 1.9 - 3.7 g/dL (calc)   AG Ratio 1.5 1.0 - 2.5 (calc)   Total Bilirubin 0.6 0.2 - 1.2 mg/dL   Alkaline phosphatase (APISO) 63 33 - 130 U/L   AST 25 10 - 35 U/L   ALT 18 6 - 29 U/L  TSH     Status: None   Collection Time: 02/01/18 12:05 PM  Result Value Ref Range   TSH 2.21 0.40 - 4.50 mIU/L  Lipid panel     Status: Abnormal   Collection Time: 02/01/18 12:05 PM  Result Value Ref Range   Cholesterol 215 (H) <200 mg/dL   HDL 87 >50 mg/dL   Triglycerides 78 <150 mg/dL   LDL Cholesterol (Calc) 111 (H) mg/dL (calc)    Comment: Reference range: <100 . Desirable range <100 mg/dL for primary prevention;   <70 mg/dL for patients with CHD or diabetic patients  with > or = 2 CHD risk factors. Marland Kitchen LDL-C is now calculated using the Martin-Hopkins  calculation, which is a  validated novel method providing  better accuracy than the Friedewald equation in the  estimation of LDL-C.  Cresenciano Genre et al. Annamaria Helling. 8250;539(76): 2061-2068  (http://education.[employer].com/faq/FAQ164)    Total CHOL/HDL Ratio 2.5 <5.0 (calc)   Non-HDL Cholesterol (Calc) 128 <130 mg/dL (calc)    Comment: For patients with diabetes plus 1 major ASCVD risk  factor, treating to a non-HDL-C goal of <100 mg/dL  (LDL-C of <70 mg/dL) is considered a therapeutic  option.   B Nat Peptide     Status: None   Collection Time: 02/01/18 12:05 PM  Result Value Ref Range   Brain Natriuretic Peptide 26 <100 pg/mL    Comment: . BNP levels increase with age in the general population with the highest values seen  in individuals greater than 40 years of age. Reference: J. Am. [place] Ar. Cardiol. 2002; 09:983-382. .            ASSESSMENT/PLAN: The primary encounter diagnosis was Elevated hemidiaphragm. A diagnosis of Atelectasis was also pertinent to this visit.   Patient is concerned about the elevated hemidiaphragm, I do not have a good reason for why she may have this issue, but appears to be chronic since at least 2007, based on my review of the XR's.  Likely recent activity decrease and injury have resulted in decreased lung capacity and development of atelectasis, but I would like her to see a pulmonologist to make sure we are not missing anything, to see if there may be any concern for diaphragmatic paralysis, other.  Thankfully, CT is not showing any obstructing effect of tumor or lymphadenopathy   Patient advised to continue incentive spirometry and await consultation with pulmonology, ER precautions were reviewed in detail    Follow-up plan: Return for recheck depending on pulmonology recommendation  .                                   ############################################ ############################################ ############################################ ############################################    Outpatient Encounter Medications as of 02/05/2018  Medication Sig  . albuterol (PROVENTIL HFA;VENTOLIN HFA) 108 (90 Base) MCG/ACT inhaler Inhale 2 puffs into the lungs every 6 (six) hours as needed for wheezing or shortness of breath.  Marland Kitchen buPROPion (WELLBUTRIN XL) 300 MG 24 hr tablet Take by mouth daily.   . celecoxib (CELEBREX) 200 MG capsule Take 200 mg by mouth 2 (two) times daily.   . DULoxetine (CYMBALTA) 60 MG capsule Take 60 mg by mouth daily.  Marland Kitchen estradiol (ESTRACE) 1 MG tablet Take 1 mg by mouth daily.  Marland Kitchen omeprazole (PRILOSEC) 40 MG capsule Take 40 mg by mouth daily.   . pregabalin (LYRICA) 50 MG capsule Take 50 mg by mouth 3 (three) times daily.    No facility-administered encounter medications on file as of 02/05/2018.    No Known Allergies    Review of Systems:  Constitutional: No recent illness  HEENT: No  headache, no vision change  Cardiac: No  chest pain, No  pressure, No palpitations  Respiratory:  +shortness of breath. No  Cough  Musculoskeletal: No new myalgia/arthralgia  Skin: No  Rash  Neurologic: No  weakness, No  Dizziness  Psychiatric: No  concerns with depression, No  concerns with anxiety  Exam:  BP (!) 142/98 (BP Location: Left Arm, Patient Position: Sitting, Cuff Size: Normal)   Pulse 89   Temp 98.1 F (36.7 C) (Oral)   Wt 248 lb 9.6 oz (112.8 kg)   BMI 42.67 kg/m   Constitutional: VS see above. General Appearance: alert, well-developed, well-nourished, NAD  Eyes: Normal lids and conjunctive, non-icteric sclera  Ears, Nose, Mouth, Throat: MMM, Normal external inspection ears/nares/mouth/lips/gums.  Neck: No masses, trachea midline.   Respiratory: Normal respiratory effort. no wheeze, no  rhonchi, very faint rales at the extreme base of the right lung  Cardiovascular: S1/S2 normal, no murmur, no rub/gallop auscultated. RRR.   Musculoskeletal: Gait normal. Symmetric and independent movement of all extremities  Abdominal: Mild tenderness to palpation right upper quadrant my bowel sounds within normal limits x4  Neurological: Normal balance/coordination. No tremor.  Skin: warm, dry, intact.   Psychiatric: Normal judgment/insight. Normal mood and affect. Oriented x3.   Visit summary with medication list and pertinent instructions  was printed for patient to review, advised to alert [place] if any changes needed. All questions at time of visit were answered - patient instructed to contact office with any additional concerns. ER/RTC precautions were reviewed with the patient and understanding verbalized.   Follow-up plan: Return for recheck depending on pulmonology recommendation .  Note: Total time spent 25 minutes, greater than 50% of the visit was spent face-to-face counseling and coordinating care for the following: The primary encounter diagnosis was Elevated hemidiaphragm. A diagnosis of Atelectasis was also pertinent to this visit.Marland Kitchen  Please note: voice recognition software was used to produce this document, and typos may escape review. Please contact Dr. Sheppard Coil for any needed clarifications.

## 2018-02-11 ENCOUNTER — Encounter: Payer: Self-pay | Admitting: Pulmonary Disease

## 2018-02-11 ENCOUNTER — Ambulatory Visit (INDEPENDENT_AMBULATORY_CARE_PROVIDER_SITE_OTHER): Payer: BLUE CROSS/BLUE SHIELD | Admitting: Pulmonary Disease

## 2018-02-11 VITALS — BP 124/84 | HR 109 | Ht 64.0 in | Wt 247.0 lb

## 2018-02-11 DIAGNOSIS — M5090 Cervical disc disorder, unspecified, unspecified cervical region: Secondary | ICD-10-CM | POA: Diagnosis not present

## 2018-02-11 DIAGNOSIS — J9811 Atelectasis: Secondary | ICD-10-CM

## 2018-02-11 DIAGNOSIS — J986 Disorders of diaphragm: Secondary | ICD-10-CM

## 2018-02-11 DIAGNOSIS — R0602 Shortness of breath: Secondary | ICD-10-CM | POA: Diagnosis not present

## 2018-02-11 NOTE — Patient Instructions (Signed)
Fatigue: I am concerned he may be retaining carbon dioxide so we will check a blood test called a blood gas  Elevated right hemidiaphragm on chest imaging: We are going to order a test called a diaphragm fluoroscopy to see if the diaphragm is moving  Shortness of breath: We will check lung function test to see if there is evidence of something other than right hemidiaphragm elevation  If it turns out that your diaphragm is not moving then we will likely need to obtain an MRI of your neck and refer you to neurology for further evaluation  Weight loss is very important here.   The following behaviors have been associated with weight loss: Weigh yourself daily Write down everything you eat Drink a glass of water prior to eating a meal Only eat when you are hungry Buy food from the periphery of the grocery store, not the middle  We will see you back in 1 to 2 weeks with a nurse practitioner to go over the results of the blood gas and the lung function test and consider referral to neurology

## 2018-02-11 NOTE — Progress Notes (Signed)
Synopsis: Referred in September 2019 for elevated hemidiaphragm  Subjective:   PATIENT ID: [name] GENDER: female DOB: 1955-07-17, MRN: [id number]   HPI  Chief Complaint  Patient presents with  . pulm consult    Pt referred by Dr. Sheppard Coil MD. Pt had CXR showed nodule, then had blood work, and CR Scan. Pt has increase SOB with exertion, wheezing, wakes up choking in middle of night, gasps for air, and has productive cough sometimes throw up.     This is a pleasant 62 year old female who comes to my clinic today for evaluation of an elevated right hemidiaphragm.  She says that she had a normal childhood without respiratory illnesses though she knows that she was born around 5 pounds.  She does not believe that she was born prematurely.  She never had any respiratory problems as a child though both of her parents suffered from lung cancer.  She says that approximately 1 year ago she retired and around that time she developed a case of bronchitis.  She went to go see primary care and she was told that this is likely a viral problem.  She says that this recurred 2 more times afterwards.  Most recently it occurred again in March 2019.  At that point she had a chest x-ray and was told that it was normal.  Later in 2019 she had shoulder surgery on the right lung.  She says that prior to starting surgery the anesthesiologist asked her if she had COPD or asthma because her O2 saturation was low and apparently it took some time for the O2 saturation to recover after surgery.  Since surgery she says that her breathing has been worsening.  She describes shortness of breath with nearly any activity including talking.  She is also much more short of breath when she lay flat.  She has a cough from time to time.  She says that since retiring she has gained about 30 pounds because she stopped exercising.  She notes a feeling of early satiety for the last several months.  She denies any numbness or  tingling in her extremities.  She tells me that she has been told in the past that she has problems with her cervical spine and she did undergo injection several years ago.  She was told at one point that she needed to have surgery but she declined.  Past Medical History:  Diagnosis Date  . Anxiety   . Back pain   . Depression   . GERD (gastroesophageal reflux disease)   . Hypertension      Family History  Problem Relation Age of Onset  . Cancer Mother        lung  . Cancer Father        lung  . Hypertension Father   . Diabetes Father   . Non-Hodgkin's lymphoma Son      Social History   Socioeconomic History  . Marital status: Married    Spouse name: Not on file  . Number of children: 2  . Years of education: Not on file  . Highest education level: Not on file  Occupational History  . Occupation: retired  Scientific laboratory technician  . Financial resource strain: Not on file  . Food insecurity:    Worry: Not on file    Inability: Not on file  . Transportation needs:    Medical: Not on file    Non-medical: Not on file  Tobacco Use  . Smoking status: Passive  Smoke Exposure - Never Smoker  . Smokeless tobacco: Never Used  Substance and Sexual Activity  . Alcohol use: Yes  . Drug use: Never  . Sexual activity: Yes    Birth control/protection: Post-menopausal  Lifestyle  . Physical activity:    Days per week: Not on file    Minutes per session: Not on file  . Stress: Not on file  Relationships  . Social connections:    Talks on phone: Not on file    Gets together: Not on file    Attends religious service: Not on file    Active member of club or organization: Not on file    Attends meetings of clubs or organizations: Not on file    Relationship status: Not on file  . Intimate partner violence:    Fear of current or ex partner: Not on file    Emotionally abused: Not on file    Physically abused: Not on file    Forced sexual activity: Not on file  Other Topics Concern  .  Not on file  Social History Narrative  . Not on file     No Known Allergies   Outpatient Medications Prior to Visit  Medication Sig Dispense Refill  . albuterol (PROVENTIL HFA;VENTOLIN HFA) 108 (90 Base) MCG/ACT inhaler Inhale 2 puffs into the lungs every 6 (six) hours as needed for wheezing or shortness of breath. 1 Inhaler 1  . buPROPion (WELLBUTRIN XL) 300 MG 24 hr tablet Take by mouth daily.     . celecoxib (CELEBREX) 200 MG capsule Take 200 mg by mouth 2 (two) times daily.     . DULoxetine (CYMBALTA) 60 MG capsule Take 60 mg by mouth daily.    Marland Kitchen estradiol (ESTRACE) 1 MG tablet Take 1 mg by mouth daily.    Marland Kitchen omeprazole (PRILOSEC) 40 MG capsule Take 40 mg by mouth daily.     . pregabalin (LYRICA) 50 MG capsule Take 50 mg by mouth 3 (three) times daily.      No facility-administered medications prior to visit.     Review of Systems  Constitutional: Negative for chills, fever, malaise/fatigue and weight loss.  HENT: Negative for congestion, nosebleeds, sinus pain and sore throat.   Eyes: Negative for photophobia, pain and discharge.  Respiratory: Positive for cough, shortness of breath and wheezing. Negative for hemoptysis and sputum production.   Cardiovascular: Positive for leg swelling. Negative for chest pain, palpitations and orthopnea.  Gastrointestinal: Negative for abdominal pain, constipation, diarrhea, nausea and vomiting.  Genitourinary: Negative for dysuria, frequency, hematuria and urgency.  Musculoskeletal: Negative for back pain, joint pain, myalgias and neck pain.  Skin: Negative for itching and rash.  Neurological: Positive for headaches. Negative for tingling, tremors, sensory change, speech change, focal weakness, seizures and weakness.  Endo/Heme/Allergies: Bruises/bleeds easily.  Psychiatric/Behavioral: Negative for memory loss, substance abuse and suicidal ideas. The patient is nervous/anxious.       Objective:  Physical Exam   Vitals:   02/11/18 1610    BP: 124/84  Pulse: (!) 109  SpO2: 95%  Weight: 247 lb (112 kg)  Height: 5\' 4"  (1.626 m)    RA  Gen: well appearing, no acute distress HENT: NCAT, OP clear, neck supple without masses Eyes: PERRL, EOMi Lymph: no palpable lymphadenopathy Lungs: diminished R base, otherwise CTA B CV: RRR, no mgr, no JVD GI: BS+, soft, nontender, no hsm Derm: no rash or skin breakdown MSK: normal bulk and tone Neuro: A&Ox4, CN II-XII intact, strength 5/5 in  all 4 extremities Psyche: normal mood and affect   CBC    Component Value Date/Time   WBC 5.2 02/01/2018 1205   RBC 4.57 02/01/2018 1205   HGB 14.7 02/01/2018 1205   HCT 44.0 02/01/2018 1205   PLT 311 02/01/2018 1205   MCV 96.3 02/01/2018 1205   MCH 32.2 02/01/2018 1205   MCHC 33.4 02/01/2018 1205   RDW 12.0 02/01/2018 1205     Chest imaging: 07/2017 CXR images reviewed: normal pulmonary parenchyma, no clear hemidiaphragm elevation 02/01/2018 CT chest images independently reviewed: elevated R hemidiaphragm, normal pulmonary parenchyma  PFT:  Labs:  Path:  Echo:  Heart Catheterization:  Notes from her visit with her PCP last week reviewed, she was referred to [place] for an elevated hemidiaphragm     Assessment & Plan:   Elevated hemidiaphragm - Plan: Pulmonary function test, DG Sniff Test, Blood Gas, Capillary  SOB (shortness of breath) - Plan: Pulmonary function test, DG Sniff Test, Blood Gas, Capillary  Atelectasis  Cervical neck pain with evidence of disc disease  Discussion: [name] presents with shortness of breath in the setting of what appears to be right lower lobe atelectasis and right hemidiaphragm paralysis.  She has always had an abnormal appearing right hemidiaphragm per my review of several chest x-rays over the last several years, I question whether or not she could have a eventration of the diaphragm.  However, the chest imaging from this month is strikingly different in the past and it does appear that she  now has much more clear evidence of right hemidiaphragm elevation and possible paralysis.  Possible etiologies could be viral, or perhaps some sort of primary neurologic problem or cervical nerve compression.  I favor the latter given her known history of cervical spine disease.  I doubt the shoulder surgery has anything to do with this as she was noted to be mildly hypoxemic prior to surgery and the phrenic nerve does not course anywhere near her surgical scars.  I believe that the 30 pound weight gain has contributed significantly to her dyspnea.  I explained to her today that she is at increased risk for pneumonia and worsening respiratory failure.  She may actually benefit from noninvasive mechanical ventilation at night if she turns out to retain carbon dioxide.  Plan: Fatigue: I am concerned he may be retaining carbon dioxide so we will check a blood test called a blood gas  Elevated right hemidiaphragm on chest imaging: We are going to order a test called a diaphragm fluoroscopy to see if the diaphragm is moving  Shortness of breath: We will check lung function test to see if there is evidence of something other than right hemidiaphragm elevation  If it turns out that your diaphragm is not moving then we will likely need to obtain an MRI of your neck and refer you to neurology for further evaluation  Weight loss is very important here.   The following behaviors have been associated with weight loss: Weigh yourself daily Write down everything you eat Drink a glass of water prior to eating a meal Only eat when you are hungry Buy food from the periphery of the grocery store, not the middle  We will see you back in 1 to 2 weeks with a nurse practitioner to go over the results of the blood gas and the lung function test and consider referral to neurology  > 50% of this 60 minute visit spent face to face    Current Outpatient Medications:  .  albuterol (PROVENTIL HFA;VENTOLIN HFA)  108 (90 Base) MCG/ACT inhaler, Inhale 2 puffs into the lungs every 6 (six) hours as needed for wheezing or shortness of breath., Disp: 1 Inhaler, Rfl: 1 .  buPROPion (WELLBUTRIN XL) 300 MG 24 hr tablet, Take by mouth daily. , Disp: , Rfl:  .  celecoxib (CELEBREX) 200 MG capsule, Take 200 mg by mouth 2 (two) times daily. , Disp: , Rfl:  .  DULoxetine (CYMBALTA) 60 MG capsule, Take 60 mg by mouth daily., Disp: , Rfl:  .  estradiol (ESTRACE) 1 MG tablet, Take 1 mg by mouth daily., Disp: , Rfl:  .  omeprazole (PRILOSEC) 40 MG capsule, Take 40 mg by mouth daily. , Disp: , Rfl:  .  pregabalin (LYRICA) 50 MG capsule, Take 50 mg by mouth 3 (three) times daily. , Disp: , Rfl:

## 2018-02-12 ENCOUNTER — Other Ambulatory Visit: Payer: Self-pay | Admitting: Pulmonary Disease

## 2018-02-12 DIAGNOSIS — R0602 Shortness of breath: Secondary | ICD-10-CM

## 2018-02-12 DIAGNOSIS — J986 Disorders of diaphragm: Secondary | ICD-10-CM

## 2018-02-13 ENCOUNTER — Other Ambulatory Visit: Payer: Self-pay | Admitting: Pulmonary Disease

## 2018-02-13 ENCOUNTER — Telehealth: Payer: Self-pay | Admitting: Pulmonary Disease

## 2018-02-13 ENCOUNTER — Other Ambulatory Visit: Payer: BLUE CROSS/BLUE SHIELD

## 2018-02-13 ENCOUNTER — Ambulatory Visit (INDEPENDENT_AMBULATORY_CARE_PROVIDER_SITE_OTHER): Payer: BLUE CROSS/BLUE SHIELD | Admitting: Pulmonary Disease

## 2018-02-13 DIAGNOSIS — J9811 Atelectasis: Secondary | ICD-10-CM

## 2018-02-13 DIAGNOSIS — R0602 Shortness of breath: Secondary | ICD-10-CM

## 2018-02-13 DIAGNOSIS — J986 Disorders of diaphragm: Secondary | ICD-10-CM

## 2018-02-13 LAB — PULMONARY FUNCTION TEST
DL/VA % PRED: 133 %
DL/VA: 6.25 ml/min/mmHg/L
DLCO COR: 19.65 ml/min/mmHg
DLCO UNC % PRED: 88 %
DLCO UNC: 20.39 ml/min/mmHg
DLCO cor % pred: 85 %
FEF 25-75 PRE: 1.67 L/s
FEF 25-75 Post: 4.02 L/sec
FEF2575-%Change-Post: 140 %
FEF2575-%Pred-Post: 185 %
FEF2575-%Pred-Pre: 76 %
FEV1-%CHANGE-POST: 13 %
FEV1-%PRED-POST: 74 %
FEV1-%PRED-PRE: 65 %
FEV1-POST: 1.76 L
FEV1-PRE: 1.56 L
FEV1FVC-%Change-Post: 5 %
FEV1FVC-%Pred-Pre: 107 %
FEV6-%CHANGE-POST: 7 %
FEV6-%PRED-POST: 67 %
FEV6-%Pred-Pre: 63 %
FEV6-POST: 2.03 L
FEV6-PRE: 1.89 L
FEV6FVC-%PRED-POST: 104 %
FEV6FVC-%PRED-PRE: 104 %
FVC-%Change-Post: 7 %
FVC-%Pred-Post: 65 %
FVC-%Pred-Pre: 60 %
FVC-Post: 2.03 L
FVC-Pre: 1.89 L
POST FEV6/FVC RATIO: 100 %
PRE FEV6/FVC RATIO: 100 %
Post FEV1/FVC ratio: 87 %
Pre FEV1/FVC ratio: 82 %
RV % PRED: 75 %
RV: 1.5 L
TLC % PRED: 72 %
TLC: 3.54 L

## 2018-02-13 NOTE — Progress Notes (Signed)
PFT done today. 

## 2018-02-13 NOTE — Telephone Encounter (Signed)
Reviewed pt's AVS instructions and OV note. There is no mention of any other lab work that needs to be done other than an ABG. I spoke with Lattie Haw in the lab and made her aware of this. Nothing further was needed.

## 2018-02-15 ENCOUNTER — Ambulatory Visit (HOSPITAL_COMMUNITY)
Admission: RE | Admit: 2018-02-15 | Discharge: 2018-02-15 | Disposition: A | Discharge status: Home / Self Care | Payer: BLUE CROSS/BLUE SHIELD | Source: Ambulatory Visit | Attending: Pulmonary Disease | Admitting: Pulmonary Disease

## 2018-02-15 DIAGNOSIS — R0602 Shortness of breath: Secondary | ICD-10-CM | POA: Diagnosis not present

## 2018-02-15 DIAGNOSIS — J986 Disorders of diaphragm: Secondary | ICD-10-CM | POA: Diagnosis not present

## 2018-02-18 ENCOUNTER — Ambulatory Visit (HOSPITAL_COMMUNITY)
Admission: RE | Admit: 2018-02-18 | Discharge: 2018-02-18 | Disposition: A | Discharge status: Home / Self Care | Payer: BLUE CROSS/BLUE SHIELD | Source: Ambulatory Visit | Attending: Pulmonary Disease | Admitting: Pulmonary Disease

## 2018-02-18 DIAGNOSIS — J9811 Atelectasis: Secondary | ICD-10-CM | POA: Insufficient documentation

## 2018-02-18 LAB — BLOOD GAS, ARTERIAL
ACID-BASE EXCESS: 0.3 mmol/L (ref 0.0–2.0)
Acid-base deficit: 0.4 mmol/L (ref 0.0–2.0)
Bicarbonate: 23.1 mmol/L (ref 20.0–28.0)
DRAWN BY: 270211
FIO2: 0.21
O2 Saturation: 97.1 %
PATIENT TEMPERATURE: 98.6
PCO2 ART: 33.7 mmHg (ref 32.0–48.0)
PH ART: 7.451 — AB (ref 7.350–7.450)
pO2, Arterial: 87.9 mmHg (ref 83.0–108.0)

## 2018-02-18 NOTE — Progress Notes (Signed)
RT Note: ABG results not crossing over into epic. IT notified. Results will be in epic when problem resolved. ABG Results on RA PH 7.451 CO2 33.7 PO2 87.9 cthb 15.4 so2 97.1 fco2hb 95.6 FCOHb 0.8 Fmethb  0.7 HCO3 23.1 ABE  0.3 Base -0.4 Tco2(b) 19.7 Tco2(p) 24.1 SO2 97.1

## 2018-02-21 ENCOUNTER — Encounter: Payer: Self-pay | Admitting: Adult Health

## 2018-02-21 ENCOUNTER — Ambulatory Visit (INDEPENDENT_AMBULATORY_CARE_PROVIDER_SITE_OTHER): Payer: BLUE CROSS/BLUE SHIELD | Admitting: Adult Health

## 2018-02-21 DIAGNOSIS — J4520 Mild intermittent asthma, uncomplicated: Secondary | ICD-10-CM

## 2018-02-21 DIAGNOSIS — J986 Disorders of diaphragm: Secondary | ICD-10-CM | POA: Diagnosis not present

## 2018-02-21 DIAGNOSIS — M5090 Cervical disc disorder, unspecified, unspecified cervical region: Secondary | ICD-10-CM | POA: Diagnosis not present

## 2018-02-21 DIAGNOSIS — J45909 Unspecified asthma, uncomplicated: Secondary | ICD-10-CM

## 2018-02-21 DIAGNOSIS — Z6841 Body Mass Index (BMI) 40.0 and over, adult: Secondary | ICD-10-CM

## 2018-02-21 HISTORY — DX: Unspecified asthma, uncomplicated: J45.909

## 2018-02-21 MED ORDER — BUDESONIDE-FORMOTEROL FUMARATE 80-4.5 MCG/ACT IN AERO: 2.0000 | INHALATION_SPRAY | Freq: Two times a day (BID) | RESPIRATORY_TRACT | 0 refills | Status: DC

## 2018-02-21 NOTE — Progress Notes (Signed)
Reviewed, agree 

## 2018-02-21 NOTE — Assessment & Plan Note (Signed)
Sniff test positive on right

## 2018-02-21 NOTE — Addendum Note (Signed)
Addended by: Valerie Salts on: 02/21/2018 01:45 PM   Modules accepted: Orders

## 2018-02-21 NOTE — Assessment & Plan Note (Signed)
Cervical disc and joint disease.  Check MRI C-spine.  Patient had a recent traumatic fall.  Pending results consider referral to neurosurgery.[name]

## 2018-02-21 NOTE — Progress Notes (Signed)
@Patient  ID: [name], female    DOB: Feb 05, 1956, 62 y.o.   MRN: [id number]  Chief Complaint  Patient presents with  . Follow-up    sob     Referring provider: Emeterio Reeve, DO  HPI: 62 year old female never smoker seen for pulmonary consult February 11, 2018 for evaluation of elevated right hemidiaphragm and shortness of breath.  Medical history significant for premature birth  .02/21/2018 Follow up : Elevated hemidiaphragm and shortness of breath Patient presents for a one-week follow-up.  Patient was seen last visit for a pulmonary consult.  Over the last year patient had had reoccurring bronchitis around 3 times.  Last began in March 2019.  She was seen by her primary care physician and told chest x-ray was normal.  She had right shoulder surgery in June 2019.  Since this surgery she has been having increased shortness of breath with activity and especially if she tries to lie flat.  She does have some intermittent cough.  She also has gained about 30 pounds since she stopped exercising., Had a fall backwards in late August and since then she got really sob with activity and At bedtime  .  Unfortunately injuried her shoulder and will need to redo shoulder surgery .   Chest x-ray February 01, 2018 showed an elevated right hemidiaphragm and right basilar atelectasis.  Subsequent CT chest showed right middle and lower lobe atelectasis likely related to significant right hemidiaphragm elevation.  This was a new change compared to March 2019 comparison chest x-ray.  Patient was sent for a sniff test that showed paralysis of the right hemidiaphragm.  ABG was normal with pH of 7.4, PCO2 33 PO2 87. PFTs showed moderate restriction with reversibility.  FEV1 74%, ratio 87, FVC 65%, positive bronchodilator response DLCO 88%. She she does have dry cough on occasion. Lately notices some wheezing .   She has chronic neck pain , dx w/ DJD/DDD . Has seen ortho in past with injections.    No snoring , daytime sleepiness.   No Known Allergies   There is no immunization history on file for this patient.  Past Medical History:  Diagnosis Date  . Anxiety   . Asthma 02/21/2018  . Back pain   . Depression   . GERD (gastroesophageal reflux disease)   . Hypertension     Tobacco History: Social History   Tobacco Use  Smoking Status Passive Smoke Exposure - Never Smoker  Smokeless Tobacco Never Used   Counseling given: Not Answered   Outpatient Medications Prior to Visit  Medication Sig Dispense Refill  . albuterol (PROVENTIL HFA;VENTOLIN HFA) 108 (90 Base) MCG/ACT inhaler Inhale 2 puffs into the lungs every 6 (six) hours as needed for wheezing or shortness of breath. 1 Inhaler 1  . buPROPion (WELLBUTRIN XL) 300 MG 24 hr tablet Take by mouth daily.     . celecoxib (CELEBREX) 200 MG capsule Take 200 mg by mouth 2 (two) times daily.     . DULoxetine (CYMBALTA) 60 MG capsule Take 60 mg by mouth daily.    Marland Kitchen estradiol (ESTRACE) 1 MG tablet Take 1 mg by mouth daily.    Marland Kitchen omeprazole (PRILOSEC) 40 MG capsule Take 40 mg by mouth daily.     . pregabalin (LYRICA) 50 MG capsule Take 50 mg by mouth 3 (three) times daily.      No facility-administered medications prior to visit.      Review of Systems  Constitutional:   No  weight loss,  night sweats,  Fevers, chills, fatigue, or  lassitude.  HEENT:   No headaches,  Difficulty swallowing,  Tooth/dental problems, or  Sore throat,                No sneezing, itching, ear ache, nasal congestion, post nasal drip,   CV:  No chest pain,  Orthopnea, PND, swelling in lower extremities, anasarca, dizziness, palpitations, syncope.   GI  No heartburn, indigestion, abdominal pain, nausea, vomiting, diarrhea, change in bowel habits, loss of appetite, bloody stools.   Resp: No shortness of breath with exertion or at rest.  No excess mucus, no productive cough,  No non-productive cough,  No coughing up of blood.  No change in color of  mucus.  No wheezing.  No chest wall deformity  Skin: no rash or lesions.  GU: no dysuria, change in color of urine, no urgency or frequency.  No flank pain, no hematuria   MS:  No joint pain or swelling.  No decreased range of motion.  No back pain.    Physical Exam  BP (!) 149/79 (BP Location: Right Arm, Patient Position: Sitting, Cuff Size: Normal)   Pulse 90   Ht 5\' 4"  (1.626 m)   Wt 249 lb (112.9 kg)   SpO2 96%   BMI 42.74 kg/m   GEN: A/Ox3; pleasant , NAD, obese    HEENT:  [place]/AT,  EACs-clear, TMs-wnl, NOSE-clear, THROAT-clear, no lesions, no postnasal drip or exudate noted. Class 1 MP airway   NECK:  Supple w/ fair ROM; no JVD; normal carotid impulses w/o bruits; no thyromegaly or nodules palpated; no lymphadenopathy.    RESP  Clear  P & A; w/o, wheezes/ rales/ or rhonchi. no accessory muscle use, no dullness to percussion  CARD:  RRR, no m/r/g, no peripheral edema, pulses intact, no cyanosis or clubbing.  GI:   Soft & nt; nml bowel sounds; no organomegaly or masses detected.   Musco: Warm bil, no deformities or joint swelling noted.   Neuro: alert, no focal deficits noted.    Skin: Warm, no lesions or rashes    Lab Results:  CBC    Component Value Date/Time   WBC 5.2 02/01/2018 1205   RBC 4.57 02/01/2018 1205   HGB 14.7 02/01/2018 1205   HCT 44.0 02/01/2018 1205   PLT 311 02/01/2018 1205   MCV 96.3 02/01/2018 1205   MCH 32.2 02/01/2018 1205   MCHC 33.4 02/01/2018 1205   RDW 12.0 02/01/2018 1205    BMET    Component Value Date/Time   NA 140 02/01/2018 1205   K 4.6 02/01/2018 1205   CL 105 02/01/2018 1205   CO2 27 02/01/2018 1205   GLUCOSE 95 02/01/2018 1205   BUN 15 02/01/2018 1205   CREATININE 1.16 (H) 02/01/2018 1205   CALCIUM 9.3 02/01/2018 1205   GFRNONAA 50 (L) 02/01/2018 1205   GFRAA 58 (L) 02/01/2018 1205    BNP    Component Value Date/Time   BNP 26 02/01/2018 1205    ProBNP No results found for: PROBNP  Imaging: Dg Chest 2  View  Result Date: 02/05/2018 CLINICAL DATA:  Atelectasis. EXAM: CHEST - 2 VIEW COMPARISON:  02/01/2018 FINDINGS: Elevation of the right hemidiaphragm with right base atelectasis, stable. Left lung clear. Heart is normal size. No effusions. No acute bony abnormality. IMPRESSION: Elevated right hemidiaphragm with right base atelectasis. No change. Electronically Signed   By: Rolm Baptise M.D.   On: 02/05/2018 10:13   Dg Chest 2 View  Result Date: 02/01/2018 CLINICAL DATA:  Shortness of breath. EXAM: CHEST - 2 VIEW COMPARISON:  Radiographs of August 15, 2017. FINDINGS: The heart size and mediastinal contours are within normal limits. No pneumothorax or pleural effusion is noted. Left lung is clear. Stable elevated right hemidiaphragm is noted with mild right basilar subsegmental atelectasis. The visualized skeletal structures are unremarkable. IMPRESSION: Stable elevated right hemidiaphragm with mild right basilar subsegmental atelectasis. Electronically Signed   By: Marijo Conception, M.D.   On: 02/01/2018 15:08   Ct Chest W Contrast  Result Date: 02/01/2018 CLINICAL DATA:  Shortness of breath, abnormal chest x-ray EXAM: CT CHEST WITH CONTRAST TECHNIQUE: Multidetector CT imaging of the chest was performed during intravenous contrast administration. CONTRAST:  83mL ISOVUE-300 IOPAMIDOL (ISOVUE-300) INJECTION 61% COMPARISON:  Chest x-ray from earlier in the same day. FINDINGS: Cardiovascular: Thoracic aorta is within normal limits. No cardiac enlargement is seen. Pulmonary artery shows no large central embolus although timing was not performed for embolus evaluation. Mediastinum/Nodes: Thoracic inlet is within normal limits. No hilar or mediastinal adenopathy is noted. The esophagus is within normal limits. Lungs/Pleura: The left lung is well aerated without evidence of focal infiltrate or sizable effusion. No parenchymal nodules are noted. The right lung demonstrates some mild right middle lobe and lower lobe  atelectasis related to the significant elevation of the right hemidiaphragm. These changes are new from prior chest x-rays from 08/15/2017 but correspond with the findings of today's x-ray. No other focal abnormality is noted. No sizable effusion is seen. Upper Abdomen: Significant elevation of the right hemidiaphragm is identified. Both liver and colon are noted beneath the diaphragm. Musculoskeletal: Degenerative changes of the thoracic spine are noted. No acute rib abnormality is seen. IMPRESSION: Right middle and lower lobe atelectasis likely related to significant right hemidiaphragm elevation. These changes are new from prior plain film dated 08/15/2017 but correspond with those seen on recent chest x-ray. No effusion is noted. No other focal abnormality is seen. These results will be called to the ordering clinician or representative by the Radiologist Assistant, and communication documented in the PACS or zVision Dashboard. Electronically Signed   By: Inez Catalina M.D.   On: 02/01/2018 16:32   Dg Sniff Test  Result Date: 02/15/2018 CLINICAL DATA:  Elevated right hemidiaphragm EXAM: CHEST FLUOROSCOPY TECHNIQUE: Real-time fluoroscopic evaluation of the chest was performed. FLUOROSCOPY TIME:  Fluoroscopy Time:  42 seconds Radiation Exposure Index (if provided by the fluoroscopic device): Number of Acquired Spot Images: 0 COMPARISON:  Chest CT 02/01/2018.  Chest x-ray 02/05/2018. FINDINGS: There is elevation of the right hemidiaphragm. The right hemidiaphragm does not move with normal breathing. Normal movement of the left hemidiaphragm. Paradoxical movement of the right hemidiaphragm with sniffing. IMPRESSION: Paralysis of the elevated right hemidiaphragm. Electronically Signed   By: Rolm Baptise M.D.   On: 02/15/2018 10:13     Assessment & Plan:   Elevated hemidiaphragm Sniff test confirms paralyzed right hemidiaphragm.  Questionable etiology.  Over the last year she has had viral illness traumatic  fall and surgery unclear if any of these were contributing factors. PFT shows restrictive changes as expected. Patient does have underlying cervical disc and joint disease.  Will check MRI of the cervical spine.  Refer to neurology may need referral to neurosurgeon pending MRI results. Patient is advised to advance activity as tolerated work on healthy weight.  Cervical neck pain with evidence of disc disease Cervical disc and joint disease.  Check MRI C-spine.  Patient had a  recent traumatic fall.  Pending results consider referral to neurosurgery.Marland Kitchen  BMI 40.0-44.9, adult (HCC) Weight loss discussed.  Asthma Possible mild intermittent asthma.  PFTs do show reversibility.  Patient has some intermittent cough and wheezing.  There is no significant airflow obstruction.  We will try low-dose ICS/laba .  If no significant improvement on return would just use albuterol inhaler as needed.  Control for triggers such as sinus drainage and GERD.  Plan  Trial of Symbicort 80 2 puffs twice daily.  Rinse after use. Ventolin inhaler as needed   Paralyzed hemidiaphragm Sniff test positive on right      Rexene Edison, NP 02/21/2018

## 2018-02-21 NOTE — Assessment & Plan Note (Signed)
Possible mild intermittent asthma.  PFTs do show reversibility.  Patient has some intermittent cough and wheezing.  There is no significant airflow obstruction.  We will try low-dose ICS/laba .  If no significant improvement on return would just use albuterol inhaler as needed.  Control for triggers such as sinus drainage and GERD.  Plan  Trial of Symbicort 80 2 puffs twice daily.  Rinse after use. Ventolin inhaler as needed

## 2018-02-21 NOTE — Assessment & Plan Note (Signed)
Weight loss discussed  

## 2018-02-21 NOTE — Patient Instructions (Addendum)
Begin Symbicort 80 2 puffs Twice daily  , rinse after use .  Ventolin 2 puffs every 4hr as needed wheezing /shortness of breath .  MIRI C spine .  Refer to Neurology .  Activity as tolerated.  Follow up with Dr. Lake Bells or Makyna Niehoff NP in 4 weeks and As needed   Please contact office for sooner follow up if symptoms do not improve or worsen or seek emergency care

## 2018-02-21 NOTE — Assessment & Plan Note (Signed)
Sniff test confirms paralyzed right hemidiaphragm.  Questionable etiology.  Over the last year she has had viral illness traumatic fall and surgery unclear if any of these were contributing factors. PFT shows restrictive changes as expected. Patient does have underlying cervical disc and joint disease.  Will check MRI of the cervical spine.  Refer to neurology may need referral to neurosurgeon pending MRI results. Patient is advised to advance activity as tolerated work on healthy weight.

## 2018-02-22 ENCOUNTER — Other Ambulatory Visit: Payer: Self-pay

## 2018-02-22 DIAGNOSIS — M542 Cervicalgia: Secondary | ICD-10-CM

## 2018-02-26 ENCOUNTER — Ambulatory Visit (HOSPITAL_COMMUNITY): Payer: BLUE CROSS/BLUE SHIELD

## 2018-02-28 ENCOUNTER — Telehealth: Payer: Self-pay | Admitting: Pulmonary Disease

## 2018-02-28 NOTE — Telephone Encounter (Addendum)
Spoke with [name] at [facility]. She is needing the order for the MRI of the cervical spine. Order has been faxed. Nothing further was needed.

## 2018-03-01 ENCOUNTER — Other Ambulatory Visit: Payer: Self-pay | Admitting: Pulmonary Disease

## 2018-03-01 DIAGNOSIS — M542 Cervicalgia: Secondary | ICD-10-CM

## 2018-03-01 NOTE — Progress Notes (Signed)
[name], [facility], called and stated that the MRI of the cervical spine needed to be without contrast per protocol for MRI.  Patient is scheduled today for MRI. New order placed for MRI cervical spine for neck pain,chronic xray bone, disc destruction, and cervicalgia.  Katie left fax number for any info that needed to be faxed as [phone]. Nothing further at this time.

## 2018-03-04 ENCOUNTER — Telehealth: Payer: Self-pay | Admitting: Pulmonary Disease

## 2018-03-04 NOTE — Telephone Encounter (Signed)
MRI has been received and placed in BQ's look out folder per Burman Nieves.  Will route to Dr. Lake Bells.

## 2018-03-04 NOTE — Telephone Encounter (Signed)
Spoke to pt, who is requesting MRI results.  Per our records, MRI was performed at [place] [facility].  It does not appear that we have received results, as I have checked BQ's look at and folder up front.  I have contacted [facility] at [phone], and left message requesting that results be faxed to our office.  Will leave message in triage until records are received.

## 2018-03-05 NOTE — Telephone Encounter (Signed)
Pt is calling back [phone]

## 2018-03-06 ENCOUNTER — Other Ambulatory Visit: Payer: Self-pay | Admitting: Pulmonary Disease

## 2018-03-06 DIAGNOSIS — M5090 Cervical disc disorder, unspecified, unspecified cervical region: Secondary | ICD-10-CM

## 2018-03-06 NOTE — Telephone Encounter (Signed)
Neurology referral, this is already in progress

## 2018-03-06 NOTE — Telephone Encounter (Signed)
Noted.   Called and spoke with pt letting her know the information per BQ and stated to her that it is already in progress. Pt expressed understanding. Nothing further needed.

## 2018-03-06 NOTE — Telephone Encounter (Signed)
Called and spoke with pt letting her know the results from the MRI report.  Pt expressed understanding but stated she wanted to know if anything needed to be done in regards to the results.  Dr. Lake Bells, please advise. Thanks!

## 2018-03-06 NOTE — Telephone Encounter (Signed)
There was mild narrowing in her cervical spine, but nothing that would explain the diaphragm paralysis

## 2018-03-28 ENCOUNTER — Ambulatory Visit (INDEPENDENT_AMBULATORY_CARE_PROVIDER_SITE_OTHER): Payer: BLUE CROSS/BLUE SHIELD | Admitting: Adult Health

## 2018-03-28 ENCOUNTER — Encounter: Payer: Self-pay | Admitting: Adult Health

## 2018-03-28 DIAGNOSIS — M5090 Cervical disc disorder, unspecified, unspecified cervical region: Secondary | ICD-10-CM | POA: Diagnosis not present

## 2018-03-28 DIAGNOSIS — I10 Essential (primary) hypertension: Secondary | ICD-10-CM | POA: Diagnosis not present

## 2018-03-28 DIAGNOSIS — J4520 Mild intermittent asthma, uncomplicated: Secondary | ICD-10-CM

## 2018-03-28 DIAGNOSIS — J986 Disorders of diaphragm: Secondary | ICD-10-CM

## 2018-03-28 NOTE — Patient Instructions (Signed)
Ventolin 2 puffs every 4hr as needed wheezing /shortness of breath .  Activity as tolerated.  Follow up with Dr. Lake Bells or Balbina Depace NP in 6 months with chest xray  and As needed  -[place] office .  Please contact office for sooner follow up if symptoms do not improve or worsen or seek emergency care

## 2018-03-28 NOTE — Assessment & Plan Note (Signed)
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 MRI C-spine does not show any high-grade /severe cervical disorder.  Patient did have a traumatic fall and had recurrent respiratory illness. Clinically is slightly improved with improved exercise and activity tolerance.  Long discussion with patient and husband regarding expectations.  Advised patient to advance activity as tolerated.  We will follow-up in 6 months with a follow-up chest x-ray. Declines flu shot.

## 2018-03-28 NOTE — Assessment & Plan Note (Addendum)
Cervical spondylosis with neck pain.   Cont on current regimen  May need referral to Neurosurgery if pain persists.

## 2018-03-28 NOTE — Assessment & Plan Note (Signed)
Blood pressure rechecked and remains elevated at today's visit.  Patient is asymptomatic.  Advised patient to follow-up with primary care physician for ongoing blood pressure monitoring.

## 2018-03-28 NOTE — Progress Notes (Signed)
Reviewed, agree 

## 2018-03-28 NOTE — Progress Notes (Signed)
@Patient  ID: [name], female    DOB: [birth year], 62 y.o.   MRN: [id number]  Chief Complaint  Patient presents with  . Follow-up    dyspnea     Referring provider: Emeterio Reeve, DO  HPI: 62 year old female never smoker seen for pulmonary consult February 11, 2018 for evaluation of elevated right hemidiaphragm and shortness of breath.  Medical history significant for premature birth  TEST /Events  Chest x-ray February 01, 2018 showed an elevated right hemidiaphragm and right basilar atelectasis.    Subsequent CT chest showed right middle and lower lobe atelectasis likely related to significant right hemidiaphragm elevation.  This was a new change compared to March 2019 comparison chest x-ray.    Patient was sent for a sniff test that showed paralysis of the right hemidiaphragm.  ABG was normal with pH of 7.4, PCO2 33 PO2 87.  PFTs showed moderate restriction with reversibility.  FEV1 74%, ratio 87, FVC 65%, positive bronchodilator response DLCO 88%.  03/28/2018 Follow up : Dyspnea Patient returns for a one-month follow-up.  Was seen last visit after pulmonary consult in September for abnormal chest x-ray with elevated right hemidiaphragm and progressive dyspnea and recurrent bronchitis . Sniff test confirmed a paralyzed right hemidiaphragm.   Pulmonary function testing showed moderate restriction with reversibility.   ABG showed no hypercarbia .  Patient  had recent shoulder surgery in June 2016 and a traumatic fall shortly after surgery with unfortunate reinjury that will require repeat shoulder surgery . She had symptoms of dyspnea previously but after the fall seemed to get worse. Had associated  intermittent cough and wheezing . History of chronic neck pain but has been getting worse.  Patient was sent for an MRI C-spine that showed degenerative spondylosis, no high-grade spinal canal stenosis and multilevel degenerative disc disease and facet arthropathy.  Since last  visit patient is feeling some better.  At last visit she was given Symbicort for possible mild asthma component.  Says that she did not feel that this made any difference.  And only took it for a brief time.  She says she has occasional wheeze and dry cough.  She says her breathing has gotten some better since 2 months ago but continues to have shortness of breath with heavy activity.  Does have voice fatigue after prolonged talking at times.     No Known Allergies   There is no immunization history on file for this patient.  Past Medical History:  Diagnosis Date  . Anxiety   . Asthma 02/21/2018  . Back pain   . Depression   . GERD (gastroesophageal reflux disease)   . Hypertension     Tobacco History: Social History   Tobacco Use  Smoking Status Passive Smoke Exposure - Never Smoker  Smokeless Tobacco Never Used   Counseling given: Not Answered   Outpatient Medications Prior to Visit  Medication Sig Dispense Refill  . albuterol (PROVENTIL HFA;VENTOLIN HFA) 108 (90 Base) MCG/ACT inhaler Inhale 2 puffs into the lungs every 6 (six) hours as needed for wheezing or shortness of breath. 1 Inhaler 1  . budesonide-formoterol (SYMBICORT) 80-4.5 MCG/ACT inhaler Inhale 2 puffs into the lungs 2 (two) times daily. 1 Inhaler 0  . buPROPion (WELLBUTRIN XL) 300 MG 24 hr tablet Take by mouth daily.     . celecoxib (CELEBREX) 200 MG capsule Take 200 mg by mouth 2 (two) times daily.     . DULoxetine (CYMBALTA) 60 MG capsule Take 60 mg by mouth daily.    Marland Kitchen  estradiol (ESTRACE) 1 MG tablet Take 1 mg by mouth daily.    Marland Kitchen omeprazole (PRILOSEC) 40 MG capsule Take 40 mg by mouth daily.     . pregabalin (LYRICA) 50 MG capsule Take 50 mg by mouth 3 (three) times daily.      No facility-administered medications prior to visit.      Review of Systems  Constitutional:   No  weight loss, night sweats,  Fevers, chills,  +fatigue, or  lassitude.  HEENT:   No headaches,  Difficulty swallowing,   Tooth/dental problems, or  Sore throat,                No sneezing, itching, ear ache, nasal congestion, post nasal drip,   CV:  No chest pain,  Orthopnea, PND, swelling in lower extremities, anasarca, dizziness, palpitations, syncope.   GI  No heartburn, indigestion, abdominal pain, nausea, vomiting, diarrhea, change in bowel habits, loss of appetite, bloody stools.   Resp:  No excess mucus, no productive cough,  No non-productive cough,  No coughing up of blood.  No change in color of mucus.  No wheezing.  No chest wall deformity  Skin: no rash or lesions.  GU: no dysuria, change in color of urine, no urgency or frequency.  No flank pain, no hematuria   MS:  No joint pain or swelling.  No decreased range of motion.  +neck/back pain     Physical Exam  BP (!) 139/100   Pulse 75   Temp (!) 97.4 F (36.3 C)   SpO2 94%   GEN: A/Ox3; pleasant , NAD, obese   HEENT:  [place]/AT, THROAT-clear, no lesions, no postnasal drip or exudate noted.   NECK:   no JVD; normal carotid impulses w/o bruits; no thyromegaly or nodules palpated; no lymphadenopathy.    RESP  Clear  P & A; w/o, wheezes/ rales/ or rhonchi. no accessory muscle use, no dullness to percussion  CARD:  RRR, no m/r/g, no peripheral edema, pulses intact, no cyanosis or clubbing.  GI:   Soft & nt; nml bowel sounds;  Musco: Warm bil, no deformities or joint swelling noted.   Neuro: alert, no focal deficits noted.    Skin: Warm, no lesions or rashes    Lab Results:  CBC    Component Value Date/Time   WBC 5.2 02/01/2018 1205   RBC 4.57 02/01/2018 1205   HGB 14.7 02/01/2018 1205   HCT 44.0 02/01/2018 1205   PLT 311 02/01/2018 1205   MCV 96.3 02/01/2018 1205   MCH 32.2 02/01/2018 1205   MCHC 33.4 02/01/2018 1205   RDW 12.0 02/01/2018 1205    BMET    Component Value Date/Time   NA 140 02/01/2018 1205   K 4.6 02/01/2018 1205   CL 105 02/01/2018 1205   CO2 27 02/01/2018 1205   GLUCOSE 95 02/01/2018 1205   BUN 15  02/01/2018 1205   CREATININE 1.16 (H) 02/01/2018 1205   CALCIUM 9.3 02/01/2018 1205   GFRNONAA 50 (L) 02/01/2018 1205   GFRAA 58 (L) 02/01/2018 1205    BNP    Component Value Date/Time   BNP 26 02/01/2018 1205    ProBNP No results found for: PROBNP  Imaging: No results found.    PFT Results Latest Ref Rng & Units 02/13/2018  FVC-Pre L 1.89  FVC-Predicted Pre % 60  FVC-Post L 2.03  FVC-Predicted Post % 65  Pre FEV1/FVC % % 82  Post FEV1/FCV % % 87  FEV1-Pre L 1.56  FEV1-Predicted Pre % 65  DLCO UNC% % 88  DLCO COR %Predicted % 133  TLC L 3.54  TLC % Predicted % 72  RV % Predicted % 75    No results found for: NITRICOXIDE      Assessment & Plan:   Paralyzed hemidiaphragm Paralyzed right hemidiaphragm questionable etiology. MRI C-spine does not show any high-grade /severe cervical disorder.  Patient did have a traumatic fall and had recurrent respiratory illness. Clinically is slightly improved with improved exercise and activity tolerance.  Long discussion with patient and husband regarding expectations.  Advised patient to advance activity as tolerated.  We will follow-up in 6 months with a follow-up chest x-ray. Declines flu shot.  Asthma Intermittent cough and wheezing patient may have a component of reactive airways when she gets bronchitis.  It seems to be resolved at present time.  No perceived clinical benefit with Symbicort.  She may use Ventolin as needed.  Hypertension Blood pressure rechecked and remains elevated at today's visit.  Patient is asymptomatic.  Advised patient to follow-up with primary care physician for ongoing blood pressure monitoring.  Disorder of intervertebral disc of cervical spine Cervical spondylosis with neck pain.   Cont on current regimen  May need referral to Neurosurgery if pain persists.      Rexene Edison, NP 03/28/2018

## 2018-03-28 NOTE — Assessment & Plan Note (Signed)
Intermittent cough and wheezing patient may have a component of reactive airways when she gets bronchitis.  It seems to be resolved at present time.  No perceived clinical benefit with Symbicort.  She may use Ventolin as needed.

## 2018-07-01 ENCOUNTER — Ambulatory Visit (INDEPENDENT_AMBULATORY_CARE_PROVIDER_SITE_OTHER): Payer: BLUE CROSS/BLUE SHIELD | Admitting: Osteopathic Medicine

## 2018-07-01 ENCOUNTER — Encounter: Payer: Self-pay | Admitting: Osteopathic Medicine

## 2018-07-01 ENCOUNTER — Ambulatory Visit (INDEPENDENT_AMBULATORY_CARE_PROVIDER_SITE_OTHER): Payer: BLUE CROSS/BLUE SHIELD

## 2018-07-01 VITALS — BP 126/82 | HR 103 | Temp 98.0°F | Wt 240.9 lb

## 2018-07-01 DIAGNOSIS — R0602 Shortness of breath: Secondary | ICD-10-CM | POA: Diagnosis not present

## 2018-07-01 DIAGNOSIS — R05 Cough: Secondary | ICD-10-CM | POA: Diagnosis not present

## 2018-07-01 DIAGNOSIS — R059 Cough, unspecified: Secondary | ICD-10-CM

## 2018-07-01 MED ORDER — GUAIFENESIN-CODEINE 100-10 MG/5ML PO SYRP: 5.0000 mL | ORAL_SOLUTION | Freq: Three times a day (TID) | ORAL | 0 refills | Status: DC | PRN

## 2018-07-01 MED ORDER — PREDNISONE 20 MG PO TABS: 20.0000 mg | ORAL_TABLET | Freq: Two times a day (BID) | ORAL | 0 refills | Status: DC

## 2018-07-01 MED ORDER — FAMOTIDINE 40 MG PO TABS: 40.0000 mg | ORAL_TABLET | Freq: Every day | ORAL | 0 refills | Status: DC

## 2018-07-01 MED ORDER — BENZONATATE 200 MG PO CAPS: 200.0000 mg | ORAL_CAPSULE | Freq: Three times a day (TID) | ORAL | 0 refills | Status: DC | PRN

## 2018-07-01 NOTE — Patient Instructions (Signed)
Will treat as viral respiratory infection: cough medicine, steroid burst.  May be a component of acid reflux triggering cough reflex w/ airway irritation: add printed Famotidine Rx if needed.   If worse, please let me or your pulmonologist know right away!

## 2018-07-01 NOTE — Progress Notes (Signed)
HPI: [name] is a 63 y.o. female who  has a past medical history of Anxiety, Asthma (02/21/2018), Back pain, Depression, GERD (gastroesophageal reflux disease), and Hypertension.  she presents to [facility] today, 07/01/18,  for chief complaint of: Cough   . Context: R hemidiaphragm paralysis last seen by Pulm 03/28/2018 and had stopped Symbicort by that time. Declined flu vaccine. Adgvised f/u 6 mos. Advised Ventolin prn.  . Location/Quality: cough and chest congestion, feels "thick"  . Timing: worse at night and w/ food . Modifying factors: Mucinex and Robitussin are not helping. Stopped albuterol and Symbicort - these weren't helping. She reports she has informed her pulmonogist about this change.  Marland Kitchen Hx GERD, on PPI, no known hiatal hernia or hx gastritis     Social History   Tobacco Use  Smoking Status Passive Smoke Exposure - Never Smoker  Smokeless Tobacco Never Used      Past medical, surgical, social and family history reviewed and updated as necessary.   Current medication list and allergy/intolerance information reviewed:    Current Meds  Medication Sig  . buPROPion (WELLBUTRIN XL) 300 MG 24 hr tablet Take by mouth daily.   . celecoxib (CELEBREX) 200 MG capsule Take 200 mg by mouth 2 (two) times daily.   . DULoxetine (CYMBALTA) 60 MG capsule Take 60 mg by mouth daily.  Marland Kitchen estradiol (ESTRACE) 1 MG tablet Take 1 mg by mouth daily.  Marland Kitchen omeprazole (PRILOSEC) 40 MG capsule Take 40 mg by mouth daily.   . pregabalin (LYRICA) 50 MG capsule Take 50 mg by mouth 3 (three) times daily.      No Known Allergies    Review of Systems:  Constitutional:  No  fever, no chills, No recent illness, No unintentional weight changes. +fatigue.   HEENT: No  headache, no vision change, no hearing change, No sore throat, No  sinus pressure  Cardiac: No  chest pain, No  pressure, No palpitations  Respiratory:  No  shortness of breath.  +Cough  Gastrointestinal: No  abdominal pain, No  nausea, No  vomiting,  No  blood in stool, No  diarrhea  Musculoskeletal: No new myalgia/arthralgia  Skin: No  Rash  Neurologic: No  weakness, No  dizziness  Exam:  BP 126/82 (BP Location: Left Arm, Patient Position: Sitting, Cuff Size: Large)   Pulse (!) 103   Temp 98 F (36.7 C) (Oral)   Wt 240 lb 14.4 oz (109.3 kg)   SpO2 97%   BMI 41.35 kg/m   Constitutional: VS see above. General Appearance: alert, well-developed, well-nourished, NAD  Eyes: Normal lids and conjunctive, non-icteric sclera  Ears, Nose, Mouth, Throat: MMM, Normal external inspection ears/nares/mouth/lips/gums. TM normal bilaterally. Pharynx/tonsils no erythema, no exudate. Nasal mucosa normal.   Neck: No masses, trachea midline. No tenderness/mass appreciated. No lymphadenopathy  Respiratory: Normal respiratory effort. no wheeze, no rhonchi. Diminished/absent breath sounds lower R  Cardiovascular: S1/S2 normal, no murmur, no rub/gallop auscultated. RRR. No lower extremity edema.   Gastrointestinal: Nontender, no masses. Bowel sounds normal.  Musculoskeletal: Gait normal.   Neurological: Normal balance/coordination. No tremor.   Skin: warm, dry, intact.   Psychiatric: Normal judgment/insight. Normal mood and affect.   No results found for this or any previous visit (from the past 72 hour(s)).  Dg Chest 2 View  Result Date: 07/01/2018 CLINICAL DATA:  One week of cough and shortness of breath EXAM: CHEST - 2 VIEW COMPARISON:  February 05, 2018 FINDINGS: The elevated  right hemidiaphragm remains. Colon and bowel is interposed between the liver and the right diaphragmatic dome. The lungs are clear. The cardiomediastinal silhouette is stable. IMPRESSION: No acute interval change. Electronically Signed   By: Dorise Bullion III M.D   On: 07/01/2018 15:51     ASSESSMENT/PLAN: The encounter diagnosis was Cough.   Meds ordered this encounter  Medications   . famotidine (PEPCID) 40 MG tablet    Sig: Take 1 tablet (40 mg total) by mouth at bedtime.    Dispense:  90 tablet    Refill:  0  . guaiFENesin-codeine (ROBITUSSIN AC) 100-10 MG/5ML syrup    Sig: Take 5-10 mLs by mouth 3 (three) times daily as needed for cough.    Dispense:  180 mL    Refill:  0  . predniSONE (DELTASONE) 20 MG tablet    Sig: Take 1 tablet (20 mg total) by mouth 2 (two) times daily with a meal.    Dispense:  10 tablet    Refill:  0  . benzonatate (TESSALON) 200 MG capsule    Sig: Take 1 capsule (200 mg total) by mouth 3 (three) times daily as needed for cough.    Dispense:  30 capsule    Refill:  0    Orders Placed This Encounter  Procedures  . DG Chest 2 View    Patient Instructions  Will treat as viral respiratory infection: cough medicine, steroid burst.  May be a component of acid reflux triggering cough reflex w/ airway irritation: add printed Famotidine Rx if needed.   If worse, please let me or your pulmonologist know right away!         Visit summary with medication list and pertinent instructions was printed for patient to review. All questions at time of visit were answered - patient instructed to contact office with any additional concerns or updates. ER/RTC precautions were reviewed with the patient.   Follow-up plan: Return if symptoms worsen or fail to improve.    Please note: voice recognition software was used to produce this document, and typos may escape review. Please contact Dr. Sheppard Coil for any needed clarifications.

## 2018-08-29 ENCOUNTER — Telehealth: Payer: BLUE CROSS/BLUE SHIELD | Admitting: Physician Assistant

## 2018-08-29 DIAGNOSIS — R0602 Shortness of breath: Secondary | ICD-10-CM

## 2018-08-29 NOTE — Progress Notes (Signed)
Based on what you shared with me, I feel your condition warrants further evaluation and I recommend that you be seen for a face to face office visit.     NOTE: If you entered your credit card information for this eVisit, you will not be charged. You may see a "hold" on your card for the $35 but that hold will drop off and you will not have a charge processed.  If you are having a true medical emergency please call 911.  If you need an urgent face to face visit, [facility] has four urgent care centers for your convenience.    PLEASE NOTE: THE [facility] LOCATIONS AND URGENT CARE CLINICS DO NOT HAVE THE TESTING FOR CORONAVIRUS COVID19 AVAILABLE.  IF YOU FEEL YOU NEED THIS TEST YOU MUST GO TO A TRIAGE LOCATION AT ONE OF THE HOSPITAL EMERGENCY DEPARTMENTS   [email or web address] to reserve your spot online an avoid wait times  [facility] [address], [address] [place], [place] [address] Modified hours of operation: Monday-Friday, 10 AM to 6 PM  Saturday & Sunday 10 AM to 4 PM *Across the street from [employer]  [facility] (New Address!) [address], [address] , [place] [address] *Just off [address], across the road from [employer]* Modified hours of operation: Monday-Friday, 10 AM to 5 PM  Closed Saturday & Sunday   The following sites will take your insurance:  . [facility]  [phone] Get Driving Directions Find a Provider at this Location  [address] [place], [place] [address] . 10 am to 8 pm Monday-Friday . 12 pm to 8 pm Saturday-Sunday   . [facility] [place]  [phone] Get Driving Directions Find a Provider at this Location  [address], [address] [place], [place] [address] . 8 am to 8 pm Monday-Friday . 9 am to 6 pm Saturday . 11 am to 6 pm Sunday   . [facility] [place]  [phone] Get Driving Directions  [address] [place], [place] [address] . 8 am to 8 pm Monday-Friday . 8 am to 4 pm Saturday-Sunday   Your e-visit answers were reviewed by a board certified advanced clinical practitioner to complete your personal care plan.  Thank you for using e-Visits. 

## 2018-10-15 DIAGNOSIS — G8929 Other chronic pain: Secondary | ICD-10-CM | POA: Insufficient documentation

## 2018-10-29 ENCOUNTER — Telehealth: Payer: Self-pay | Admitting: Osteopathic Medicine

## 2018-10-29 NOTE — Telephone Encounter (Signed)
Patient scheduled.

## 2018-10-29 NOTE — Telephone Encounter (Signed)
Can you please triage symptoms of breathing and cough? Call patient and let me know what type of appt she will need?

## 2018-10-30 ENCOUNTER — Ambulatory Visit (INDEPENDENT_AMBULATORY_CARE_PROVIDER_SITE_OTHER): Payer: BC Managed Care – PPO | Admitting: Osteopathic Medicine

## 2018-10-30 ENCOUNTER — Other Ambulatory Visit: Payer: BC Managed Care – PPO

## 2018-10-30 ENCOUNTER — Telehealth: Payer: Self-pay | Admitting: Osteopathic Medicine

## 2018-10-30 ENCOUNTER — Encounter: Payer: Self-pay | Admitting: Osteopathic Medicine

## 2018-10-30 VITALS — BP 137/89 | Temp 97.9°F | Wt 234.0 lb

## 2018-10-30 DIAGNOSIS — R1310 Dysphagia, unspecified: Secondary | ICD-10-CM | POA: Insufficient documentation

## 2018-10-30 DIAGNOSIS — R05 Cough: Secondary | ICD-10-CM

## 2018-10-30 DIAGNOSIS — Z20822 Contact with and (suspected) exposure to covid-19: Secondary | ICD-10-CM

## 2018-10-30 DIAGNOSIS — Z9889 Other specified postprocedural states: Secondary | ICD-10-CM | POA: Diagnosis not present

## 2018-10-30 DIAGNOSIS — R6889 Other general symptoms and signs: Secondary | ICD-10-CM

## 2018-10-30 DIAGNOSIS — R059 Cough, unspecified: Secondary | ICD-10-CM

## 2018-10-30 HISTORY — DX: Other specified postprocedural states: Z98.890

## 2018-10-30 NOTE — Telephone Encounter (Signed)
See progress note from virtual visit done today.  Concern for possible viral illness, abrupt onset of SOB/coughing/choking and fatigue/bodyaches/malaise. Request COVID test, thanks!

## 2018-10-30 NOTE — Telephone Encounter (Signed)
Contacted pt to schedule COVID testing per Dr Emeterio Reeve; pt offered and accepted appointment at [facility] site 10/30/2018 at 1415; pt given address, directions, and instructions that she and all occupants of her vehicle should wear a mask; she verbalized understanding; orders placed per protocol.

## 2018-10-30 NOTE — Progress Notes (Signed)
Virtual Visit via Video (App used: Doximity) Note  I connected with      [name] on 10/30/18 at 1:00 PM by a telemedicine application and verified that I am speaking with the correct person using two identifiers.  Patient is at home I am in office    I discussed the limitations of evaluation and management by telemedicine and the availability of in person appointments. The patient expressed understanding and agreed to proceed.  History of Present Illness: [name] is a 63 y.o. female who would like to discuss  Chief Complaint  Patient presents with  . Cough    x4 days, allergy med not helping w/symptoms  . Generalized Body Aches    Coughing feeling like she is choking. Feels like previous when she needed esophageal dilatation. She is having symptoms wprse w/ swallowing but also throughout the day and associated w/ severe fatigue, body aches, SOB.      Observations/Objective: BP 137/89   Temp 97.9 F (36.6 C) (Oral)   Wt 234 lb (106.1 kg)   BMI 40.17 kg/m  BP Readings from Last 3 Encounters:  10/30/18 137/89  07/01/18 126/82  03/28/18 (!) 139/100   Exam: Normal Speech.  NAD  Lab and Radiology Results No results found for this or any previous visit (from the past 72 hour(s)). No results found.     Assessment and Plan: 63 y.o. female with The primary encounter diagnosis was Dysphagia, unspecified type. Diagnoses of History of esophageal dilatation, Cough, and Suspected Covid-19 Virus Infection were also pertinent to this visit.  Difficult to discern without physical exam, patient that she feels like this is similar to when she needed an esophageal dilatation in the past however abrupt onset of symptoms given respiratory issues and fatigue is concerning for viral infection.  I think it would be reasonable to test for COVID prior to sending to GI for any kind of procedure.  In the meantime, the patient is advised that she can try increasing the omeprazole  40 mg to twice daily dosing.  ER precautions were reviewed with particular regard to SOB/fever.    PDMP not reviewed this encounter. Orders Placed This Encounter  Procedures  . Ambulatory referral to Gastroenterology    Referral Priority:   Routine    Referral Type:   Consultation    Referral Reason:   Specialty Services Required    Number of Visits Requested:   1   No orders of the defined types were placed in this encounter.  There are no Patient Instructions on file for this visit.  Instructions sent via MyChart. If MyChart not available, pt was given option for info via personal e-mail w/ no guarantee of protected health info over unsecured e-mail communication, and MyChart sign-up instructions were included.   Follow Up Instructions: Return if symptoms worsen or fail to improve.    I discussed the assessment and treatment plan with the patient. The patient was provided an opportunity to ask questions and all were answered. The patient agreed with the plan and demonstrated an understanding of the instructions.   The patient was advised to call back or seek an in-person evaluation if any new concerns, if symptoms worsen or if the condition fails to improve as anticipated.  25 minutes of non-face-to-face time was provided during this encounter.                      Historical information moved to improve visibility  of documentation.  Past Medical History:  Diagnosis Date  . Anxiety   . Asthma 02/21/2018  . Back pain   . Depression   . GERD (gastroesophageal reflux disease)   . Hypertension    Past Surgical History:  Procedure Laterality Date  . ABDOMINAL HYSTERECTOMY    . BACK SURGERY    . SHOULDER ARTHROSCOPY W/ ROTATOR CUFF REPAIR    . SHOULDER OPEN ROTATOR CUFF REPAIR    . TUBAL LIGATION     Social History   Tobacco Use  . Smoking status: Passive Smoke Exposure - Never Smoker  . Smokeless tobacco: Never Used  Substance Use Topics  . Alcohol  use: Yes   family history includes Cancer in her father and mother; Diabetes in her father; Hypertension in her father; Non-Hodgkin's lymphoma in her son.  Medications: Current Outpatient Medications  Medication Sig Dispense Refill  . albuterol (PROVENTIL HFA;VENTOLIN HFA) 108 (90 Base) MCG/ACT inhaler Inhale 2 puffs into the lungs every 6 (six) hours as needed for wheezing or shortness of breath. 1 Inhaler 1  . budesonide-formoterol (SYMBICORT) 80-4.5 MCG/ACT inhaler Inhale 2 puffs into the lungs 2 (two) times daily. 1 Inhaler 0  . buPROPion (WELLBUTRIN XL) 300 MG 24 hr tablet Take by mouth daily.     . celecoxib (CELEBREX) 200 MG capsule Take 200 mg by mouth 2 (two) times daily.     . DULoxetine (CYMBALTA) 60 MG capsule Take 60 mg by mouth daily.    Marland Kitchen estradiol (ESTRACE) 1 MG tablet Take 1 mg by mouth daily.    . famotidine (PEPCID) 40 MG tablet Take 1 tablet (40 mg total) by mouth at bedtime. 90 tablet 0  . loratadine (CLARITIN) 10 MG tablet TAKE 1 TABLET BY MOUTH EVERY DAY    . omeprazole (PRILOSEC) 40 MG capsule Take 40 mg by mouth 2 (two) times a day.    . pregabalin (LYRICA) 50 MG capsule Take 50 mg by mouth 3 (three) times daily.      No current facility-administered medications for this visit.    No Known Allergies  PDMP not reviewed this encounter. Orders Placed This Encounter  Procedures  . Ambulatory referral to Gastroenterology    Referral Priority:   Routine    Referral Type:   Consultation    Referral Reason:   Specialty Services Required    Number of Visits Requested:   1   No orders of the defined types were placed in this encounter.

## 2018-11-01 LAB — NOVEL CORONAVIRUS, NAA: SARS-CoV-2, NAA: NOT DETECTED

## 2018-11-12 ENCOUNTER — Encounter: Payer: Self-pay | Admitting: Gastroenterology

## 2018-11-12 ENCOUNTER — Ambulatory Visit (INDEPENDENT_AMBULATORY_CARE_PROVIDER_SITE_OTHER): Payer: BC Managed Care – PPO | Admitting: Gastroenterology

## 2018-11-12 ENCOUNTER — Telehealth: Payer: Self-pay | Admitting: Emergency Medicine

## 2018-11-12 VITALS — Ht 64.0 in | Wt 234.0 lb

## 2018-11-12 DIAGNOSIS — R09A2 Foreign body sensation, throat: Secondary | ICD-10-CM

## 2018-11-12 DIAGNOSIS — R0989 Other specified symptoms and signs involving the circulatory and respiratory systems: Secondary | ICD-10-CM | POA: Diagnosis not present

## 2018-11-12 DIAGNOSIS — R198 Other specified symptoms and signs involving the digestive system and abdomen: Secondary | ICD-10-CM

## 2018-11-12 DIAGNOSIS — R1310 Dysphagia, unspecified: Secondary | ICD-10-CM | POA: Diagnosis not present

## 2018-11-12 NOTE — Telephone Encounter (Signed)
Covid-19 screening questions   Do you now or have you had a fever in the last 14 days? no  Do you have any respiratory symptoms of shortness of breath or cough now or in the last 14 days? no  Do you have any family members or close contacts with diagnosed or suspected Covid-19 in the past 14 days? no  Have you been tested for Covid-19 and found to be positive? No        

## 2018-11-12 NOTE — Progress Notes (Addendum)
TELEHEALTH VISIT  Referring Provider: Emeterio Reeve, DO Primary Care Physician:  Emeterio Reeve, DO   Tele-visit due to COVID-19 pandemic Patient requested visit virtually, consented to the virtual encounter via video enabled telemedicine application (Zoom) Contact made at:  15:27 11/12/18 Patient verified by name and date of birth Location of patient: Home Location provider: [facility] Names of persons participating: Me, patient, [name] CMA Time spent on telehealth visit: 26 minutes I discussed the limitations of evaluation and management by telemedicine. The patient expressed understanding and agreed to proceed.  Reason for Consultation: Dysphasia   IMPRESSION:  Globus EGD for dysphasia in 2017 [name])    -Empirically dilated with a 56 French Maloney dilator.      -No esophageal stricture seen. Chronic constipation previously treated with Amitiza Colonoscopy with [facility] in [place] 2013 Paralysis of the elevated right hemidiaphragm  Globus without associated pain, lateralization of the symptoms, dysphagia, odynophagia, weight loss, a change in voice, presence of a neck mass.  EGD recommended to evaluate for gastroesophageal reflux, gastric inlet pouch in the proximal esophagus, and eosinophilic esophagitis as the cause of symptoms.  If upper endoscopy is negative would consider esophageal manometry, esophageal impedance, and pH testing to exclude esophageal motility disorders.  Continue PPI BID. Consider amitriptyline 25 mg daily if no response to PPI.  PLAN: Obtain the colonoscopy report from gastroenterologist at [facility] from 2013 Continue omeprazole 40 mg BID EGD +/- biopsies versus empiric dilation for further evaluation   HPI: [name] is a 63 y.o. female referred by Dr. Sheppard Coil for further evaluation of dysphagia. The history is obtained through the patient and review of her electronic health record.    Previously seen by Dr. Sharlett Edwards for abdominal pain and dysphasia.  Also seen by a gastroenterologist associated with [facility] in [place] 2013. She had a colonoscopy at that time.   Ongoing trouble with swallowing since she was diagnosed with paralysis of the right hemidiaphragm last fall. Persistent, nonpainful sensation of a lump or foreign body in the throat that occurs between meals. There is no heartburn or regurgitation. There is no pain, lateralization of the symptoms, dysphagia, odynophagia, dysphonia, or weight loss. There is no history of radiation to the head neck, smoking, or alcohol consumption. Unsuccessfully drinks water throughout the day to try to pass the bolus. Feels like she is choking, similar to symptoms in 2007 when Dr. Sharlett Edwards performed empiric dilation. No identified triggers.   Dr. Sheppard Coil recommended increasing the medications to BID. Despite a week of higher dose of PPI treatment her symptoms have not changed. No other associated symptoms. No identified exacerbating or relieving features.   Two maternal aunts with pancreatic cancer in their 69s. Both of her parents have died from lung cancer. No known family history of colon cancer or polyps. No family history of uterine/endometrial cancer, pancreatic cancer, esophageal or gastric/stomach cancer.  Prior abdominal imaging includes: CT abdomen and pelvis with contrast 8/1/7 for left lower quadrant abdominal pain showed a small left ovarian cyst but was otherwise normal Abdominal ultrasound 03/22/2012 for abdominal pain: Fatty liver otherwise normal HIDA scan with [facility] 03/28/2012: Normal.  Gallbladder EF 93%.  Prior endoscopic evaluation includes: - Colonoscopy 06/04/1995 for left lower quadrant pain was normal.  She was diagnosed with IBS. - Colonoscopy with Dr. Sharlett Edwards for constipation, abdominal pain and bloating 8/24/7 showed melanosis coli.  He gave her diagnosis of chronic functional constipation.  She was  prescribed Amitiza 24 mcg twice daily. -  EGD for dysphasia 8/24/7 with Dr. Sharlett Edwards showed a prolapsing 2 cm hiatal hernia, no esophageal stricture, gastritis and duodenal bulb erosions.  Duodenal biopsies were normal.  She was dilated with a 56 [place] Maloney dilator.  She was prescribed lansoprazole 30 mg every morning.   Past Medical History:  Diagnosis Date  . Anxiety   . Asthma 02/21/2018  . Back pain   . Depression   . GERD (gastroesophageal reflux disease)   . Hypertension     Past Surgical History:  Procedure Laterality Date  . ABDOMINAL HYSTERECTOMY    . BACK SURGERY    . SHOULDER ARTHROSCOPY W/ ROTATOR CUFF REPAIR    . SHOULDER OPEN ROTATOR CUFF REPAIR    . TUBAL LIGATION      Current Outpatient Medications  Medication Sig Dispense Refill  . albuterol (PROVENTIL HFA;VENTOLIN HFA) 108 (90 Base) MCG/ACT inhaler Inhale 2 puffs into the lungs every 6 (six) hours as needed for wheezing or shortness of breath. 1 Inhaler 1  . budesonide-formoterol (SYMBICORT) 80-4.5 MCG/ACT inhaler Inhale 2 puffs into the lungs 2 (two) times daily. 1 Inhaler 0  . buPROPion (WELLBUTRIN XL) 300 MG 24 hr tablet Take by mouth daily.     . celecoxib (CELEBREX) 200 MG capsule Take 200 mg by mouth 2 (two) times daily.     . DULoxetine (CYMBALTA) 60 MG capsule Take 60 mg by mouth daily.    Marland Kitchen estradiol (ESTRACE) 1 MG tablet Take 1 mg by mouth daily.    . famotidine (PEPCID) 40 MG tablet Take 1 tablet (40 mg total) by mouth at bedtime. 90 tablet 0  . loratadine (CLARITIN) 10 MG tablet TAKE 1 TABLET BY MOUTH EVERY DAY    . omeprazole (PRILOSEC) 40 MG capsule Take 40 mg by mouth 2 (two) times a day.    . pregabalin (LYRICA) 50 MG capsule Take 50 mg by mouth 3 (three) times daily.      No current facility-administered medications for this visit.     Allergies as of 11/12/2018  . (No Known Allergies)    Family History  Problem Relation Age of Onset  . Cancer Mother        lung  . Cancer Father         lung  . Hypertension Father   . Diabetes Father   . Non-Hodgkin's lymphoma Son     Social History   Socioeconomic History  . Marital status: Married    Spouse name: Not on file  . Number of children: 2  . Years of education: Not on file  . Highest education level: Not on file  Occupational History  . Occupation: retired  Scientific laboratory technician  . Financial resource strain: Not on file  . Food insecurity    Worry: Not on file    Inability: Not on file  . Transportation needs    Medical: Not on file    Non-medical: Not on file  Tobacco Use  . Smoking status: Passive Smoke Exposure - Never Smoker  . Smokeless tobacco: Never Used  Substance and Sexual Activity  . Alcohol use: Yes  . Drug use: Never  . Sexual activity: Yes    Birth control/protection: Post-menopausal  Lifestyle  . Physical activity    Days per week: Not on file    Minutes per session: Not on file  . Stress: Not on file  Relationships  . Social Herbalist on phone: Not on file    Gets together: Not  on file    Attends religious service: Not on file    Active member of club or organization: Not on file    Attends meetings of clubs or organizations: Not on file    Relationship status: Not on file  . Intimate partner violence    Fear of current or ex partner: Not on file    Emotionally abused: Not on file    Physically abused: Not on file    Forced sexual activity: Not on file  Other Topics Concern  . Not on file  Social History Narrative  . Not on file    Review of Systems: ALL ROS discussed and all others negative except listed in HPI.  Physical Exam: Complete physical exam not performed due to the limits inherent in a telehealth encounter.  General: Awake, alert, and oriented, and well communicative. In no acute distress.  HEENT: EOMI, non-icteric sclera, NCAT, MMM  Neck: Normal movement of head and neck  Pulm: No labored breathing, speaking in full sentences without conversational dyspnea   Derm: No apparent lesions or bruising in visible field  MS: Moves all visible extremities without noticeable abnormality  Psych: Pleasant, cooperative, normal speech, normal affect and normal insight Neuro: Alert and appropriate   Luann Aspinwall L. Tarri Glenn, MD, MPH [facility] 11/12/2018, 1:14 PM

## 2018-11-13 ENCOUNTER — Other Ambulatory Visit: Payer: Self-pay

## 2018-11-13 ENCOUNTER — Encounter: Payer: Self-pay | Admitting: Gastroenterology

## 2018-11-13 ENCOUNTER — Ambulatory Visit (AMBULATORY_SURGERY_CENTER): Payer: BC Managed Care – PPO | Admitting: Gastroenterology

## 2018-11-13 VITALS — BP 122/76 | HR 85 | Temp 98.4°F | Resp 23 | Ht 64.0 in | Wt 234.0 lb

## 2018-11-13 DIAGNOSIS — K2970 Gastritis, unspecified, without bleeding: Secondary | ICD-10-CM

## 2018-11-13 DIAGNOSIS — K449 Diaphragmatic hernia without obstruction or gangrene: Secondary | ICD-10-CM

## 2018-11-13 DIAGNOSIS — K219 Gastro-esophageal reflux disease without esophagitis: Secondary | ICD-10-CM | POA: Diagnosis not present

## 2018-11-13 DIAGNOSIS — R1310 Dysphagia, unspecified: Secondary | ICD-10-CM | POA: Diagnosis not present

## 2018-11-13 MED ORDER — SODIUM CHLORIDE 0.9 % IV SOLN: 500.0000 mL | INTRAVENOUS | Status: DC

## 2018-11-13 NOTE — Progress Notes (Signed)
Report given to PACU, vss 

## 2018-11-13 NOTE — Progress Notes (Signed)
[facility] - [name]

## 2018-11-13 NOTE — Op Note (Signed)
[facility] Patient Name: [name] Procedure Date: 11/13/2018 7:16 AM MRN: [id number] Endoscopist: Thornton Park MD, MD Age: 63 Referring MD:  Date of Birth: [birth year] Gender: Female Account #: [id number] Procedure:                Upper GI endoscopy Indications:              Dysphagia                           Globus                           EGD for dysphasia in 2017 [name]) for similar                            symptoms that improved with dilation                           -Empirically dilated with a 56 French Maloney                            dilator                           -No esophageal stricture seen.                           Paralysis of the elevated right hemidiaphragm Medicines:                See the Anesthesia note for documentation of the                            administered medications Procedure:                Pre-Anesthesia Assessment:                           - Prior to the procedure, a History and Physical                            was performed, and patient medications and                            allergies were reviewed. The patient's tolerance of                            previous anesthesia was also reviewed. The risks                            and benefits of the procedure and the sedation                            options and risks were discussed with the patient.                            All questions were answered, and informed consent  was obtained. Prior Anticoagulants: The patient has                            taken no previous anticoagulant or antiplatelet                            agents. ASA Grade Assessment: III - A patient with                            severe systemic disease. After reviewing the risks                            and benefits, the patient was deemed in                            satisfactory condition to undergo the procedure.                           After  obtaining informed consent, the endoscope was                            passed under direct vision. Throughout the                            procedure, the patient's blood pressure, pulse, and                            oxygen saturations were monitored continuously. The                            Endoscope was introduced through the mouth, and                            advanced to the third part of duodenum. The upper                            GI endoscopy was accomplished without difficulty.                            The patient tolerated the procedure well. Scope In: Scope Out: Findings:                 The esophagus was mildly tortuous throughout. The                            z-line was slightly irregular. No endoscopic                            abnormality was evident in the esophagus to explain                            the patient's complaint of dysphagia. It was  decided, however, to proceed with dilation in the                            distal esophagus given her prior response to                            dilation. A TTS dilator was passed through the                            scope. Dilation with a 15-16.5-18 mm balloon                            dilator was performed to 18 mm. The dilation site                            was examined and showed mild mucosal disruption.                            Biopsies were obtained from the proximal and distal                            esophagus with cold forceps after dilation for                            histology of suspected eosinophilic esophagitis.                           Diffuse mildly erythematous mucosa without bleeding                            was found in the gastric antrum. Biopsies were                            taken with a cold forceps for histology.                           A medium-sized hiatal hernia was present.                           The examined duodenum was normal.                            The exam was otherwise without abnormality. Complications:            No immediate complications. Estimated blood loss:                            Minimal. Estimated Blood Loss:     Estimated blood loss was minimal. Impression:               - No endoscopic esophageal abnormality to explain                            patient's dysphagia. Esophagus dilated. Dilated.  Biopsied.                           - Mildly irregular z-line. Biopsied.                           - Erythematous mucosa in the antrum. Biopsied.                           - Medium-sized hiatal hernia.                           - Normal examined duodenum.                           - The examination was otherwise normal. Recommendation:           - Await pathology results.                           - Resume regular diet today.                           - Continue present medications including omeprazole                            40 mg twice daily.                           - Follow-up encounter in 2-4 weeks to monitor the                            response to dilation.                           - Patient has a contact number available for                            emergencies. The signs and symptoms of potential                            delayed complications were discussed with the                            patient. Return to normal activities tomorrow.                            Written discharge instructions were provided to the                            patient.                           - Written discharge instructions were provided to                            the patient. Thornton Park MD, MD 11/13/2018 8:09:13 AM This report has been  signed electronically.

## 2018-11-13 NOTE — Progress Notes (Signed)
Pt's states no medical or surgical changes since previsit or office visit. 

## 2018-11-13 NOTE — Patient Instructions (Addendum)
YOU HAD AN ENDOSCOPIC PROCEDURE TODAY AT [facility]:   Refer to the procedure report that was given to you for any specific questions about what was found during the examination.  If the procedure report does not answer your questions, please call your gastroenterologist to clarify.  If you requested that your care partner not be given the details of your procedure findings, then the procedure report has been included in a sealed envelope for you to review at your convenience later.  YOU SHOULD EXPECT: Some feelings of bloating in the abdomen. Passage of more gas than usual.  Walking can help get rid of the air that was put into your GI tract during the procedure and reduce the bloating. If you had a lower endoscopy (such as a colonoscopy or flexible sigmoidoscopy) you may notice spotting of blood in your stool or on the toilet paper. If you underwent a bowel prep for your procedure, you may not have a normal bowel movement for a few days.  Please Note:  You might notice some irritation and congestion in your nose or some drainage.  This is from the oxygen used during your procedure.  There is no need for concern and it should clear up in a day or so.  SYMPTOMS TO REPORT IMMEDIATELY:   Following upper endoscopy (EGD)  Vomiting of blood or coffee ground material  New chest pain or pain under the shoulder blades  Painful or persistently difficult swallowing  New shortness of breath  Fever of 100F or higher  Black, tarry-looking stools  For urgent or emergent issues, a gastroenterologist can be reached at any hour by calling [phone].   DIET:  We do recommend a small meal at first, but then you may proceed to your regular diet.  Drink plenty of fluids but you should avoid alcoholic beverages for 24 hours.  MEDICATIONS: Continue present medications including Omeprazole 40 mg by mouth twice daily.  Follow up with Dr. Tarri Glenn in her office in 2-4 weeks to monitor the  response to dilation.  Please see handouts given to you by your recovery nurse.  ACTIVITY:  You should plan to take it easy for the rest of today and you should NOT DRIVE or use heavy machinery until tomorrow (because of the sedation medicines used during the test).    Thank you for allowing [place] to provide for your healthcare needs today.  FOLLOW UP: Our staff will call the number listed on your records 48-72 hours following your procedure to check on you and address any questions or concerns that you may have regarding the information given to you following your procedure. If we do not reach you, we will leave a message.  We will attempt to reach you two times.  During this call, we will ask if you have developed any symptoms of COVID 19. If you develop any symptoms (ie: fever, flu-like symptoms, shortness of breath, cough etc.) before then, please call [phone].  If you test positive for Covid 19 in the 2 weeks post procedure, please call and report this information to [place].    If any biopsies were taken you will be contacted by phone or by letter within the next 1-3 weeks.  Please call us at [phone] if you have not heard about the biopsies in 3 weeks.    SIGNATURES/CONFIDENTIALITY: You and/or your care partner have signed paperwork which will be entered into your electronic medical record.  These signatures attest to the fact  that that the information above on your After Visit Summary has been reviewed and is understood.  Full responsibility of the confidentiality of this discharge information lies with you and/or your care-partner.

## 2018-11-13 NOTE — Progress Notes (Signed)
Called to room to assist during endoscopic procedure.  Patient ID and intended procedure confirmed with present staff. Received instructions for my participation in the procedure from the performing physician.  

## 2018-11-14 ENCOUNTER — Encounter: Payer: Self-pay | Admitting: *Deleted

## 2018-11-15 ENCOUNTER — Telehealth: Payer: Self-pay

## 2018-11-15 NOTE — Telephone Encounter (Signed)
  Follow up Call-  Call back number 11/13/2018  Post procedure Call Back phone  # [phone]  Permission to leave phone message Yes  Some recent data might be hidden     Patient questions:  Do you have a fever, pain , or abdominal swelling? No. Pain Score  0 *  Have you tolerated food without any problems? Yes.    Have you been able to return to your normal activities? Yes.    Do you have any questions about your discharge instructions: Diet   No. Medications  No. Follow up visit  No.  Do you have questions or concerns about your Care? No.  Actions: * If pain score is 4 or above: No action needed, pain <4. 1. (Have you developed a fever since your procedure? no  2.   Have you had an respiratory symptoms (SOB or cough) since your procedure? no  3.   Have you tested positive for COVID 19 since your procedure no  4.   Have you had any family members/close contacts diagnosed with the COVID 19 since your procedure?  no   If yes to any of these questions please route to Joylene John, RN and Alphonsa Gin, Therapist, sports.

## 2018-11-17 ENCOUNTER — Encounter: Payer: Self-pay | Admitting: Gastroenterology

## 2018-12-11 NOTE — Progress Notes (Signed)
TELEHEALTH VISIT  Referring Provider: Emeterio Reeve, DO Primary Care Physician:  Emeterio Reeve, DO   Tele-visit due to COVID-19 pandemic Patient requested visit virtually, consented to the virtual encounter via video enabled telemedicine application (Zoom) Contact made at: 09:00 12/12/18 Patient verified by name and date of birth Location of patient: Home Location provider: [facility] Names of persons participating: Me, patient, [name] CMA Time spent on telehealth visit: 14 minutes I discussed the limitations of evaluation and management by telemedicine. The patient expressed understanding and agreed to proceed.  Chief complaint: Dysphasia   IMPRESSION:  Reflux esophagitis presenting with dysphagia and globus    - biopsies confirmed reflux and were negative for eosinophilic esophagitis on EGD 11/13/18    - symptoms improved on PPI BID H pylori negative gastritis EGD for dysphasia in 2017 [name])    -Empirically dilated with a 56 French Maloney dilator by Dr. [name] in 2017.         - no esophageal stricture seen.    - Empirically dilated with 4F TTS balloon on EGD 11/13/18        - no esophageal stricture seen       - symptoms improved Chronic constipation previously treated with Amitiza Colonoscopy with [facility] in [place] 2013    - no polyps of colonoscopy 1997 or 2007 [name]) Melanosis coli Paralysis of the elevated right hemidiaphragm No known family history of colon cancer or polyps Family history of pancreatic cancer (two maternal aunts in their 75s)  Globus and dysphagia have improved with empiric dilation and increased PPI dosing.  No alarm features.   Continue PPI BID x 2 months. Then reduce dose to QD.  Consider amitriptyline 25 mg daily if symptoms recur.  We will obtain her last colonoscopy report to determine an appropriate surveillance interval.  PLAN: Continue pantoprazole 40 mg BID for two additional months  (refill today) Reviewed GERD lifestyle modications Obtain the colonoscopy report from gastroenterologist at [facility] from 2013  She declined formal follow-up appointment, will call with questions or concerns in the future as needed  HPI: [name] is a 63 y.o. female referred by Dr. Sheppard Coil under evaluation for dysphagia and globus. The patient had a virtual consultation 11/12/18. The interval  history is obtained through the patient and review of her electronic health record.   EGD 11/13/18 showed a mildly tortuous esophagus with an irregular z-line, biopsy-proven reflux esophagitis, medium sized hiatal hernia, and H. Pylori negative gastritis. She was empirically dilated with a 15-61mm TTS balloon with a small recent. Biopsies were negative for eosinophilic esophagitis.   She has had significantly less choking and dysphagia since her empiric dilation.  Nocturnal symptoms have resolved. Continues pantoprazole 40 mg BID. Appetite is good. Weight is stable. She is satisfied with the results.   She has no new complaints or concerns. She does not feel that further work-up or treatment is needed.  Prior abdominal imaging includes: CT abdomen and pelvis with contrast 8/1/7 for left lower quadrant abdominal pain showed a small left ovarian cyst but was otherwise normal Abdominal ultrasound 03/22/2012 for abdominal pain: Fatty liver otherwise normal HIDA scan with CCK 03/28/2012: Normal.  Gallbladder EF 93%.  Prior endoscopic evaluation includes: - Colonoscopy 06/04/1995 for left lower quadrant pain was normal.  She was diagnosed with IBS. - Colonoscopy with Dr. [name] for constipation, abdominal pain and bloating 8/24/7 showed melanosis coli.  He gave her diagnosis of chronic functional constipation.  She was prescribed Amitiza  24 mcg twice daily. - EGD for dysphasia 8/24/7 with Dr. [name] showed a prolapsing 2 cm hiatal hernia, no esophageal stricture, gastritis and duodenal bulb  erosions.  Duodenal biopsies were normal.  She was dilated with a 56 [place] Maloney dilator.  She was prescribed lansoprazole 30 mg every morning.   Past Medical History:  Diagnosis Date  . Anxiety   . Asthma 02/21/2018  . Back pain   . Depression   . GERD (gastroesophageal reflux disease)   . Hypertension     Past Surgical History:  Procedure Laterality Date  . ABDOMINAL HYSTERECTOMY    . BACK SURGERY    . SHOULDER ARTHROSCOPY W/ ROTATOR CUFF REPAIR    . SHOULDER OPEN ROTATOR CUFF REPAIR    . TUBAL LIGATION      Current Outpatient Medications  Medication Sig Dispense Refill  . albuterol (PROVENTIL HFA;VENTOLIN HFA) 108 (90 Base) MCG/ACT inhaler Inhale 2 puffs into the lungs every 6 (six) hours as needed for wheezing or shortness of breath. 1 Inhaler 1  . budesonide-formoterol (SYMBICORT) 80-4.5 MCG/ACT inhaler Inhale 2 puffs into the lungs 2 (two) times daily. 1 Inhaler 0  . buPROPion (WELLBUTRIN XL) 300 MG 24 hr tablet Take by mouth daily.     . CASCARA SAGRADA PO Take by mouth.    . celecoxib (CELEBREX) 200 MG capsule Take 200 mg by mouth 2 (two) times daily.     . DULoxetine (CYMBALTA) 60 MG capsule Take 60 mg by mouth daily.    Marland Kitchen estradiol (ESTRACE) 1 MG tablet Take 1 mg by mouth daily.    Marland Kitchen loratadine (CLARITIN) 10 MG tablet TAKE 1 TABLET BY MOUTH EVERY DAY    . omeprazole (PRILOSEC) 40 MG capsule Take 40 mg by mouth 2 (two) times a day.    . ondansetron (ZOFRAN) 4 MG tablet Take by mouth.    . pregabalin (LYRICA) 50 MG capsule Take 50 mg by mouth 3 (three) times daily.     . ST JOHNS WORT PO Take by mouth.    . vitamin E 1000 UNIT capsule Take by mouth.     Current Facility-Administered Medications  Medication Dose Route Frequency Provider Last Rate Last Dose  . 0.9 %  sodium chloride infusion  500 mL Intravenous Continuous Thornton Park, MD        Allergies as of 12/12/2018  . (No Known Allergies)    Family History  Problem Relation Age of Onset  . Cancer  Mother        lung  . Cancer Father        lung  . Hypertension Father   . Diabetes Father   . Non-Hodgkin's lymphoma Son   . Colon cancer Maternal Aunt   . Esophageal cancer Maternal Uncle   . Rectal cancer Neg Hx   . Stomach cancer Neg Hx     Social History   Socioeconomic History  . Marital status: Married    Spouse name: Not on file  . Number of children: 2  . Years of education: Not on file  . Highest education level: Not on file  Occupational History  . Occupation: retired  Scientific laboratory technician  . Financial resource strain: Not on file  . Food insecurity    Worry: Not on file    Inability: Not on file  . Transportation needs    Medical: Not on file    Non-medical: Not on file  Tobacco Use  . Smoking status: Passive Smoke Exposure - Never  Smoker  . Smokeless tobacco: Never Used  Substance and Sexual Activity  . Alcohol use: Yes    Comment: occasionally  . Drug use: Never  . Sexual activity: Yes    Birth control/protection: Post-menopausal, Surgical    Comment: hysterectomy  Lifestyle  . Physical activity    Days per week: Not on file    Minutes per session: Not on file  . Stress: Not on file  Relationships  . Social Herbalist on phone: Not on file    Gets together: Not on file    Attends religious service: Not on file    Active member of club or organization: Not on file    Attends meetings of clubs or organizations: Not on file    Relationship status: Not on file  . Intimate partner violence    Fear of current or ex partner: Not on file    Emotionally abused: Not on file    Physically abused: Not on file    Forced sexual activity: Not on file  Other Topics Concern  . Not on file  Social History Narrative  . Not on file    Physical Exam: Complete physical exam not performed due to the limits inherent in a telehealth encounter.  General: Awake, alert, and oriented, and well communicative. In no acute distress.  HEENT: EOMI, non-icteric  sclera, NCAT, MMM  Neck: Normal movement of head and neck  Pulm: No labored breathing, speaking in full sentences without conversational dyspnea  Derm: No apparent lesions or bruising in visible field  MS: Moves all visible extremities without noticeable abnormality  Psych: Pleasant, cooperative, normal speech, normal affect and normal insight Neuro: Alert and appropriate   Dura Mccormack L. Tarri Glenn, MD, MPH [facility] 12/11/2018, 11:24 PM

## 2018-12-12 ENCOUNTER — Ambulatory Visit (INDEPENDENT_AMBULATORY_CARE_PROVIDER_SITE_OTHER): Payer: BC Managed Care – PPO | Admitting: Gastroenterology

## 2018-12-12 ENCOUNTER — Encounter: Payer: Self-pay | Admitting: Gastroenterology

## 2018-12-12 VITALS — Ht 64.0 in | Wt 234.0 lb

## 2018-12-12 DIAGNOSIS — R0989 Other specified symptoms and signs involving the circulatory and respiratory systems: Secondary | ICD-10-CM | POA: Diagnosis not present

## 2018-12-12 DIAGNOSIS — R198 Other specified symptoms and signs involving the digestive system and abdomen: Secondary | ICD-10-CM

## 2018-12-12 DIAGNOSIS — R1310 Dysphagia, unspecified: Secondary | ICD-10-CM

## 2018-12-12 MED ORDER — PANTOPRAZOLE SODIUM 40 MG PO TBEC: 40.0000 mg | DELAYED_RELEASE_TABLET | Freq: Two times a day (BID) | ORAL | 2 refills | Status: DC

## 2018-12-12 NOTE — Patient Instructions (Signed)
I am delighted that you are feeling better!  Continue to take your pantoprazole (Protonix) twice daily for 2 months.  Then reduce the dose to 40 mg daily.  I will work to obtain your colonoscopy report from [place] in 2013 to make sure that you have your next colonoscopy at the right time.  We have not arranged any schedule follow-up today.  However, please call with any questions or concerns in the meantime.

## 2019-02-07 DIAGNOSIS — C441121 Basal cell carcinoma of skin of right upper eyelid, including canthus: Secondary | ICD-10-CM | POA: Insufficient documentation

## 2019-02-21 DIAGNOSIS — C44311 Basal cell carcinoma of skin of nose: Secondary | ICD-10-CM | POA: Insufficient documentation

## 2019-03-08 ENCOUNTER — Other Ambulatory Visit: Payer: Self-pay | Admitting: Gastroenterology

## 2019-06-05 ENCOUNTER — Other Ambulatory Visit: Payer: Self-pay | Admitting: Gastroenterology

## 2019-08-01 DIAGNOSIS — M25562 Pain in left knee: Secondary | ICD-10-CM | POA: Insufficient documentation

## 2019-11-12 ENCOUNTER — Emergency Department
Admission: RE | Admit: 2019-11-12 | Discharge: 2019-11-12 | Disposition: A | Discharge status: Home / Self Care | Payer: BC Managed Care – PPO | Source: Ambulatory Visit | Attending: Family Medicine | Admitting: Family Medicine

## 2019-11-12 ENCOUNTER — Other Ambulatory Visit: Payer: Self-pay

## 2019-11-12 VITALS — BP 146/84 | HR 84 | Temp 98.9°F | Resp 17

## 2019-11-12 DIAGNOSIS — S51819A Laceration without foreign body of unspecified forearm, initial encounter: Secondary | ICD-10-CM

## 2019-11-12 DIAGNOSIS — Z23 Encounter for immunization: Secondary | ICD-10-CM | POA: Diagnosis not present

## 2019-11-12 MED ORDER — MUPIROCIN 2 % EX OINT: 1.0000 "application " | TOPICAL_OINTMENT | Freq: Two times a day (BID) | CUTANEOUS | 0 refills | Status: DC

## 2019-11-12 MED ORDER — TETANUS-DIPHTH-ACELL PERTUSSIS 5-2.5-18.5 LF-MCG/0.5 IM SUSP
0.5000 mL | Freq: Once | INTRAMUSCULAR | Status: AC
  Administered 2019-11-12: 0.5 mL via INTRAMUSCULAR

## 2019-11-12 NOTE — ED Provider Notes (Signed)
[name]    CSN: [id number] Arrival date & time: 11/12/19  0930      History   Chief Complaint Chief Complaint  Patient presents with  . Appointment  . Laceration    HPI [name] is a 64 y.o. female.   Patient scraped her right forearm six days ago resulting in a skin tear.  She denies pain or drainage from the wound but complain of a piece of bandage that remains adhered to the center of the wound.  She does not remember her last Tdap.   The history is provided by the patient.    Past Medical History:  Diagnosis Date  . Anxiety   . Asthma 02/21/2018  . Back pain   . Depression   . GERD (gastroesophageal reflux disease)   . Hypertension     Patient Active Problem List   Diagnosis Date Noted  . Dysphagia 10/30/2018  . History of esophageal dilatation 10/30/2018  . Asthma 02/21/2018  . Paralyzed hemidiaphragm 02/21/2018  . Elevated hemidiaphragm 02/05/2018  . Atelectasis 02/05/2018  . Anxiety 02/01/2018  . Cervical neck pain with evidence of disc disease 02/01/2018  . BMI 40.0-44.9, adult ([place]) 02/01/2018  . Cataract 04/01/2015  . Depression 04/01/2015  . Hyperlipidemia 04/01/2015  . Hypertension 04/01/2015  . Disorder of intervertebral disc of cervical spine 04/01/2015  . Lumbar disc disease 04/01/2015  . Hormone replacement therapy (postmenopausal) 07/21/2014  . Fatty (change of) liver, not elsewhere classified 05/29/2012    Past Surgical History:  Procedure Laterality Date  . ABDOMINAL HYSTERECTOMY    . BACK SURGERY    . SHOULDER ARTHROSCOPY W/ ROTATOR CUFF REPAIR    . SHOULDER OPEN ROTATOR CUFF REPAIR    . TUBAL LIGATION      OB History   No obstetric history on file.      Home Medications    Prior to Admission medications   Medication Sig Start Date End Date Taking? Authorizing Provider  albuterol (PROVENTIL HFA;VENTOLIN HFA) 108 (90 Base) MCG/ACT inhaler Inhale 2 puffs into the lungs every 6 (six) hours as needed for  wheezing or shortness of breath. 08/15/17  Yes Hutch Rhett, Ishmael Holter, MD  budesonide-formoterol (SYMBICORT) 80-4.5 MCG/ACT inhaler Inhale 2 puffs into the lungs 2 (two) times daily. 02/21/18  Yes Parrett, Tammy S, NP  buPROPion (WELLBUTRIN XL) 300 MG 24 hr tablet Take by mouth daily.  08/19/14  Yes [provider]  CASCARA SAGRADA PO Take by mouth.   Yes [provider]  celecoxib (CELEBREX) 200 MG capsule Take 200 mg by mouth 2 (two) times daily.  06/26/14  Yes [provider]  DULoxetine (CYMBALTA) 60 MG capsule Take 60 mg by mouth daily.   Yes [provider]  estradiol (ESTRACE) 1 MG tablet Take 1 mg by mouth daily.   Yes [provider]  loratadine (CLARITIN) 10 MG tablet TAKE 1 TABLET BY MOUTH EVERY DAY 02/20/15  Yes [provider]  omeprazole (PRILOSEC) 40 MG capsule Take 40 mg by mouth 2 (two) times a day. 08/04/17  Yes [provider]  pantoprazole (PROTONIX) 40 MG tablet TAKE 1 TABLET (40 MG TOTAL) BY MOUTH 2 (TWO) TIMES DAILY BEFORE A MEAL. 06/05/19  Yes Thornton Park, MD  pregabalin (LYRICA) 50 MG capsule Take 50 mg by mouth 3 (three) times daily.  03/26/17  Yes [provider]  ST JOHNS WORT PO Take by mouth.   Yes [provider]  vitamin E 1000 UNIT capsule Take  by mouth.   Yes [provider]  mupirocin ointment (BACTROBAN) 2 % Apply 1 application topically 2 (two) times daily. 11/12/19   Kandra Nicolas, MD  ondansetron (ZOFRAN) 4 MG tablet Take by mouth. 05/05/14   [provider]    Family History Family History  Problem Relation Age of Onset  . Cancer Mother        lung  . Cancer Father        lung  . Hypertension Father   . Diabetes Father   . Non-Hodgkin's lymphoma Son   . Colon cancer Maternal Aunt   . Esophageal cancer Maternal Uncle   . Rectal cancer Neg Hx   . Stomach cancer Neg Hx     Social History Social History   Tobacco Use  . Smoking status: Passive Smoke  Exposure - Never Smoker  . Smokeless tobacco: Never Used  Vaping Use  . Vaping Use: Never used  Substance Use Topics  . Alcohol use: Yes    Comment: occasionally  . Drug use: Never     Allergies   Patient has no known allergies.   Review of Systems Review of Systems  Constitutional: Negative for chills, diaphoresis, fatigue and fever.  Skin: Positive for wound. Negative for color change and rash.  All other systems reviewed and are negative.    Physical Exam Triage Vital Signs ED Triage Vitals [11/12/19 1011]  Enc Vitals Group     BP (!) 146/84     Pulse Rate 84     Resp 17     Temp 98.9 F (37.2 C)     Temp Source Oral     SpO2 97 %     Weight      Height      Head Circumference      Peak Flow      Pain Score      Pain Loc      Pain Edu?      Excl. in [place]?    No data found.  Updated Vital Signs BP (!) 146/84 (BP Location: Left Arm)   Pulse 84   Temp 98.9 F (37.2 C) (Oral)   Resp 17   SpO2 97%   Visual Acuity Right Eye Distance:   Left Eye Distance:   Bilateral Distance:    Right Eye Near:   Left Eye Near:    Bilateral Near:     Physical Exam Vitals and nursing note reviewed.  Constitutional:      General: She is not in acute distress. HENT:     Head: Normocephalic.  Eyes:     Pupils: Pupils are equal, round, and reactive to light.  Cardiovascular:     Rate and Rhythm: Normal rate.  Pulmonary:     Effort: Pulmonary effort is normal.  Musculoskeletal:        General: No swelling.       Arms:     Comments: Healing skin tear wound right forearm.  Some devitalized epidermis remains but area appears to be healing well. There is also a small piece of gauze bandage firmly adhered to center of wound.  Mild tenderness to palpation but no erythema or drainage.  Skin:    General: Skin is warm and dry.  Neurological:     Mental Status: She is alert and oriented to person, place, and time.      UC Treatments / Results  Labs (all labs ordered  are listed, but only abnormal results are displayed)  Labs Reviewed - No data to display  EKG   Radiology No results found.  Procedures Procedures (including critical care time)  Medications Ordered in UC Medications  Tdap (BOOSTRIX) injection 0.5 mL (0.5 mLs Intramuscular Given 11/12/19 1042)    Initial Impression / Assessment and Plan / UC Course  I have reviewed the triage vital signs and the nursing notes.  Pertinent labs & imaging results that were available during my care of the patient were reviewed by me and considered in my medical decision making (see chart for details).    Administered Tdap  Wound appears to be healing well without definite evidence cellulitis.  Will leave remaining skin fragments and small piece of gauze in place. I expect these to slough off as wound heals.  Rx written for mupirocin ointment. Followup with Family Doctor if not improved in one week.    Final Clinical Impressions(s) / UC Diagnoses   Final diagnoses:  Skin tear of forearm without complication, initial encounter     Discharge Instructions     Change non-stick bandage daily and apply mupirocin ointment to wound.  Keep wound clean and dry.  Return for any signs of infection (or follow-up with family doctor):  Increasing redness, swelling, pain, heat, drainage, etc.      ED Prescriptions    Medication Sig Dispense Auth. Provider   mupirocin ointment (BACTROBAN) 2 % Apply 1 application topically 2 (two) times daily. 30 g Kandra Nicolas, MD        Kandra Nicolas, MD 11/16/19 [phone]

## 2019-11-12 NOTE — Discharge Instructions (Addendum)
Change non-stick bandage daily and apply mupirocin ointment to wound.  Keep wound clean and dry.  Return for any signs of infection (or follow-up with family doctor):  Increasing redness, swelling, pain, heat, drainage, etc.

## 2019-11-12 NOTE — ED Triage Notes (Signed)
Skin tear/laceration to RFA on 11/06/19 Pt cut arm on a shower faucet at [employer] in place  Can not recall last TDap Denies Fever Pt concerned for open areas & possible bandage in wound

## 2020-02-09 ENCOUNTER — Emergency Department
Admission: RE | Admit: 2020-02-09 | Discharge: 2020-02-09 | Disposition: A | Discharge status: Home / Self Care | Payer: BC Managed Care – PPO | Source: Ambulatory Visit

## 2020-02-09 ENCOUNTER — Emergency Department (INDEPENDENT_AMBULATORY_CARE_PROVIDER_SITE_OTHER): Payer: BC Managed Care – PPO

## 2020-02-09 ENCOUNTER — Other Ambulatory Visit: Payer: Self-pay

## 2020-02-09 VITALS — BP 133/85 | HR 94 | Temp 99.0°F | Resp 21 | Ht 63.0 in | Wt 228.0 lb

## 2020-02-09 DIAGNOSIS — R5383 Other fatigue: Secondary | ICD-10-CM

## 2020-02-09 DIAGNOSIS — I2699 Other pulmonary embolism without acute cor pulmonale: Secondary | ICD-10-CM | POA: Diagnosis not present

## 2020-02-09 DIAGNOSIS — R0602 Shortness of breath: Secondary | ICD-10-CM | POA: Diagnosis not present

## 2020-02-09 DIAGNOSIS — R05 Cough: Secondary | ICD-10-CM | POA: Diagnosis not present

## 2020-02-09 DIAGNOSIS — R0789 Other chest pain: Secondary | ICD-10-CM | POA: Diagnosis not present

## 2020-02-09 LAB — POCT CBC W AUTO DIFF (K'VILLE URGENT CARE)

## 2020-02-09 MED ORDER — IOPAMIDOL (ISOVUE-370) INJECTION 76%
100.0000 mL | Freq: Once | INTRAVENOUS | Status: AC | PRN
  Administered 2020-02-09: 100 mL via INTRAVENOUS

## 2020-02-09 MED ORDER — APIXABAN 5 MG PO TABS: ORAL_TABLET | ORAL | 0 refills | Status: DC

## 2020-02-09 NOTE — ED Provider Notes (Signed)
[name]    CSN: [id number] Arrival date & time: 02/09/20  1052      History   Chief Complaint Chief Complaint  Patient presents with  . Fatigue  . Shortness of Breath    HPI [name] is a 64 y.o. female.   HPI [name] is a 64 y.o. female presenting to UC with c/o shortness of breath, fatigue, pain between shoulder blades 5/10 in severity and dry cough for about 1 week.  Hx of Right side elevated hemidiaphragm and is concerned this is affecting her Right lung.  Denies fever, chills, n/v/d. Denies chest pain or palpitations. Family hx of MIs. Her son had a stroke and MI in his 41s.  Father had an MI in his 49s. Pt has not known CAD.  She had a negative COVID test at home and fully vaccinated for COVID with Moderna vaccine in July 2021. No sick contacts.   Past Medical History:  Diagnosis Date  . Anxiety   . Asthma 02/21/2018  . Back pain   . Depression   . GERD (gastroesophageal reflux disease)   . Hypertension     Patient Active Problem List   Diagnosis Date Noted  . Dysphagia 10/30/2018  . History of esophageal dilatation 10/30/2018  . Asthma 02/21/2018  . Paralyzed hemidiaphragm 02/21/2018  . Elevated hemidiaphragm 02/05/2018  . Atelectasis 02/05/2018  . Anxiety 02/01/2018  . Cervical neck pain with evidence of disc disease 02/01/2018  . BMI 40.0-44.9, adult ([place]) 02/01/2018  . Cataract 04/01/2015  . Depression 04/01/2015  . Hyperlipidemia 04/01/2015  . Hypertension 04/01/2015  . Disorder of intervertebral disc of cervical spine 04/01/2015  . Lumbar disc disease 04/01/2015  . Hormone replacement therapy (postmenopausal) 07/21/2014  . Fatty (change of) liver, not elsewhere classified 05/29/2012    Past Surgical History:  Procedure Laterality Date  . ABDOMINAL HYSTERECTOMY    . BACK SURGERY    . SHOULDER ARTHROSCOPY W/ ROTATOR CUFF REPAIR    . SHOULDER OPEN ROTATOR CUFF REPAIR    . TUBAL LIGATION      OB History   No obstetric  history on file.      Home Medications    Prior to Admission medications   Medication Sig Start Date End Date Taking? Authorizing Provider  albuterol (PROVENTIL HFA;VENTOLIN HFA) 108 (90 Base) MCG/ACT inhaler Inhale 2 puffs into the lungs every 6 (six) hours as needed for wheezing or shortness of breath. 08/15/17   Kandra Nicolas, MD  apixaban (ELIQUIS) 5 MG TABS tablet Take 2 tablets (10mg ) twice daily for 7 days, then 1 tablet (5mg ) twice daily 02/09/20   Noe Gens, PA-C  budesonide-formoterol (SYMBICORT) 80-4.5 MCG/ACT inhaler Inhale 2 puffs into the lungs 2 (two) times daily. 02/21/18   Parrett, Fonnie Mu, NP  buPROPion (WELLBUTRIN XL) 300 MG 24 hr tablet Take by mouth daily.  08/19/14   [provider]  CASCARA SAGRADA PO Take by mouth.    [provider]  DULoxetine (CYMBALTA) 60 MG capsule Take 60 mg by mouth daily.    [provider]  estradiol (ESTRACE) 1 MG tablet Take 1 mg by mouth daily.    [provider]  loratadine (CLARITIN) 10 MG tablet TAKE 1 TABLET BY MOUTH EVERY DAY 02/20/15   [provider]  mupirocin ointment (BACTROBAN) 2 % Apply 1 application topically 2 (two) times daily. 11/12/19   Kandra Nicolas, MD  omeprazole (PRILOSEC) 40 MG capsule Take 40 mg by  mouth 2 (two) times a day. 08/04/17   [provider]  ondansetron (ZOFRAN) 4 MG tablet Take by mouth. 05/05/14   [provider]  pantoprazole (PROTONIX) 40 MG tablet TAKE 1 TABLET (40 MG TOTAL) BY MOUTH 2 (TWO) TIMES DAILY BEFORE A MEAL. 06/05/19   Thornton Park, MD  pregabalin (LYRICA) 50 MG capsule Take 50 mg by mouth 3 (three) times daily.  03/26/17   [provider]  vitamin E 1000 UNIT capsule Take by mouth.    [provider]    Family History Family History  Problem Relation Age of Onset  . Cancer Mother        lung  . Cancer Father        lung  . Hypertension Father   . Diabetes Father   . Non-Hodgkin's lymphoma Son     . Colon cancer Maternal Aunt   . Esophageal cancer Maternal Uncle   . Rectal cancer Neg Hx   . Stomach cancer Neg Hx     Social History Social History   Tobacco Use  . Smoking status: Passive Smoke Exposure - Never Smoker  . Smokeless tobacco: Never Used  Vaping Use  . Vaping Use: Never used  Substance Use Topics  . Alcohol use: Yes    Comment: occasionally  . Drug use: Never     Allergies   Patient has no known allergies.   Review of Systems Review of Systems  Constitutional: Negative for chills and fever.  HENT: Negative for congestion, ear pain, sore throat, trouble swallowing and voice change.   Respiratory: Positive for cough and shortness of breath.   Cardiovascular: Negative for chest pain and palpitations.  Gastrointestinal: Negative for abdominal pain, diarrhea, nausea and vomiting.  Musculoskeletal: Positive for back pain. Negative for arthralgias and myalgias.  Skin: Negative for rash.  Neurological: Negative for dizziness, light-headedness and headaches.  All other systems reviewed and are negative.    Physical Exam Triage Vital Signs ED Triage Vitals  Enc Vitals Group     BP 02/09/20 1107 133/85     Pulse Rate 02/09/20 1107 94     Resp 02/09/20 1107 (!) 21     Temp 02/09/20 1107 99 F (37.2 C)     Temp Source 02/09/20 1107 Oral     SpO2 02/09/20 1107 97 %     Weight 02/09/20 1110 228 lb (103.4 kg)     Height 02/09/20 1110 5\' 3"  (1.6 m)     Head Circumference --      Peak Flow --      Pain Score 02/09/20 1109 0     Pain Loc --      Pain Edu? --      Excl. in [place]? --    No data found.  Updated Vital Signs BP 133/85 (BP Location: Left Arm)   Pulse 94   Temp 99 F (37.2 C) (Oral)   Resp (!) 21   Ht 5\' 3"  (1.6 m)   Wt 228 lb (103.4 kg)   SpO2 97%   BMI 40.39 kg/m   Visual Acuity Right Eye Distance:   Left Eye Distance:   Bilateral Distance:    Right Eye Near:   Left Eye Near:    Bilateral Near:     Physical Exam Vitals and  nursing note reviewed.  Constitutional:      General: She is not in acute distress.    Appearance: She is well-developed. She is not ill-appearing, toxic-appearing or diaphoretic.  HENT:     Head: Normocephalic and atraumatic.  Neck:     Thyroid: No thyromegaly.  Cardiovascular:     Rate and Rhythm: Normal rate and regular rhythm.  Pulmonary:     Effort: Pulmonary effort is normal.     Breath sounds: Normal breath sounds. No decreased breath sounds, wheezing, rhonchi or rales.  Musculoskeletal:        General: Normal range of motion.     Cervical back: Normal range of motion and neck supple.  Lymphadenopathy:     Cervical: No cervical adenopathy.  Skin:    General: Skin is warm and dry.  Neurological:     Mental Status: She is alert and oriented to person, place, and time.  Psychiatric:        Behavior: Behavior normal.      UC Treatments / Results  Labs (all labs ordered are listed, but only abnormal results are displayed) Labs Reviewed  CBC WITH DIFFERENTIAL/PLATELET  BASIC METABOLIC PANEL  POCT CBC W AUTO DIFF ([place])    EKG Date/Time: 02/09/2020   11:22:19 Ventricular Rate: 92 PR Interval: 138 QRS Duration: 82 QT Interval: 380 QTC Calculation: 469 P-R-T axes: 50   12   59 Text Interpretation: Normal sinus rhythm, low voltage QRS. Borderline ECG.   No prior to compare.  Radiology DG Chest 2 View  Result Date: 02/09/2020 CLINICAL DATA:  Shortness of breath, dyspnea and fatigue EXAM: CHEST - 2 VIEW COMPARISON:  Chest x-ray of July 01, 2018 and CT chest of February 01, 2018 FINDINGS: RIGHT hemidiaphragm remains elevated. Cardiomediastinal contours with cardiac enlargement. Subtle added density along the LEFT mainstem bronchus, air column. RIGHT lower lobe with juxta diaphragmatic atelectasis.  No effusion. No acute musculoskeletal process to the extent evaluated. IMPRESSION: 1. Subtle added density along the LEFT mainstem bronchus may  represent a small area of inspissated secretions but this areas not well evaluated. Chest CT may be helpful to assess for any bronchial abnormalities as clinically warranted. There is no sign of volume loss in the LEFT chest. 2. Chronic elevation of the RIGHT hemidiaphragm. 3. Cardiomegaly. Electronically Signed   By: Zetta Bills M.D.   On: 02/09/2020 11:50   CT Angio Chest PE W and/or Wo Contrast  Result Date: 02/09/2020 CLINICAL DATA:  Chest pain, shortness of breath, cough EXAM: CT ANGIOGRAPHY CHEST WITH CONTRAST TECHNIQUE: Multidetector CT imaging of the chest was performed using the standard protocol during bolus administration of intravenous contrast. Multiplanar CT image reconstructions and MIPs were obtained to evaluate the vascular anatomy. CONTRAST:  148mL ISOVUE-370 IOPAMIDOL (ISOVUE-370) INJECTION 76% COMPARISON:  02/01/2018 FINDINGS: Cardiovascular: Filling defects noted within a right upper lobe pulmonary arterial branch compatible with small pulmonary embolus. Heart is normal size. Aorta normal caliber. Mediastinum/Nodes: No mediastinal, hilar, or axillary adenopathy. Trachea and esophagus are unremarkable. Lungs/Pleura: Elevation of the right hemidiaphragm, stable since prior study. Right basilar scarring. No acute confluent airspace opacities or effusions. Upper Abdomen: Imaging into the upper abdomen demonstrates no acute findings. Musculoskeletal: Small nodule within the subcutaneous soft tissues of the left upper quadrant measuring 2.1 cm, stable since prior study, nonspecific. No acute bony abnormality. Review of the MIP images confirms the above findings. IMPRESSION: Small pulmonary embolus seen within a right upper lobe pulmonary artery. No evidence of right heart strain. Stable chronic elevation of the right hemidiaphragm. Right basilar scarring. Electronically Signed   By: Rolm Baptise M.D.   On: 02/09/2020 13:29    Procedures Procedures (including  critical care  time)  Medications Ordered in UC Medications - No data to display  Initial Impression / Assessment and Plan / UC Course  I have reviewed the triage vital signs and the nursing notes.  Pertinent labs & imaging results that were available during my care of the patient were reviewed by me and considered in my medical decision making (see chart for details).     Discussed CXR with pt's PCP, Dr. Sheppard Coil, agrees to continue with CT to r/o PE given SOB.  CT significant for small pulmonary embolus in Right upper lobe.  Discussed pt with Dr. Lanny Cramp, pt is stable, O2 Sat 97% on RA, does not need to go to hospital at this time. Will start her on Eliquis.    F/u with PCP later this week. Discussed symptoms that warrant emergent care in the ED. AVS given  Final Clinical Impressions(s) / UC Diagnoses   Final diagnoses:  Shortness of breath  Other fatigue  Other acute pulmonary embolism without acute cor pulmonale (HCC)     Discharge Instructions      Please start the Eliquis blood thinner today. Do not take other medications that can also cause thinning of the blood including NSAIDs- ibuprofen, naproxen, Advil, Aleve, aspirin.  Do not take over the counter supplements without discussing with your primary care provider or a pharmacist as some over the counter medication can cause increased bleeding while taking Eliquis.    Please call to schedule a follow up appointment with primary care later this week for recheck of symptoms.   Call 911 or have someone drive you to the hospital if symptoms significantly worsening- worsening shortness of breath, chest pain, passing out, or other new concerning symptoms develop.     ED Prescriptions    Medication Sig Dispense Auth. Provider   apixaban (ELIQUIS) 5 MG TABS tablet Take 2 tablets (10mg ) twice daily for 7 days, then 1 tablet (5mg ) twice daily 60 tablet Noe Gens, PA-C     PDMP not reviewed this encounter.   Noe Gens,  [place] 02/09/20 1733

## 2020-02-09 NOTE — ED Notes (Signed)
Critical radiology report called regarding pt's Chest CT. Tilden Fossa, PA notified.

## 2020-02-09 NOTE — Discharge Instructions (Signed)
  Please start the Eliquis blood thinner today. Do not take other medications that can also cause thinning of the blood including NSAIDs- ibuprofen, naproxen, Advil, Aleve, aspirin.  Do not take over the counter supplements without discussing with your primary care provider or a pharmacist as some over the counter medication can cause increased bleeding while taking Eliquis.    Please call to schedule a follow up appointment with primary care later this week for recheck of symptoms.   Call 911 or have someone drive you to the hospital if symptoms significantly worsening- worsening shortness of breath, chest pain, passing out, or other new concerning symptoms develop.

## 2020-02-09 NOTE — ED Triage Notes (Signed)
Pt reports having negative covid home test. Pt is fully vaccinated for covid 11/2019 moderna. C/o sob, fatigue and pain between shoulder blades 5/0-10. Dry cough as well. Pt reports medical issues w/ right side lung which could affect R lung perfusion and compliance. Pt has been following up with PCP. No otc meds. No other concerns at this time.

## 2020-02-10 ENCOUNTER — Telehealth: Payer: Self-pay | Admitting: Emergency Medicine

## 2020-02-10 NOTE — Telephone Encounter (Signed)
Pt's husband called regarding complaints of a HA - see notes from 02/09/20 - started on blood thinner yesterday. Dr Assunta Found updated. Pt's husband directed to take pt to ED now for further evaluation. Mr Kinzie verbalized an understanding

## 2020-02-12 LAB — BASIC METABOLIC PANEL
BUN: 10 mg/dL (ref 7–25)
CO2: 23 mmol/L (ref 20–32)
Calcium: 8.4 mg/dL — ABNORMAL LOW (ref 8.6–10.4)
Chloride: 103 mmol/L (ref 98–110)
Creat: 0.78 mg/dL (ref 0.50–0.99)
Glucose, Bld: 80 mg/dL (ref 65–99)
Potassium: 4.2 mmol/L (ref 3.5–5.3)
Sodium: 136 mmol/L (ref 135–146)

## 2020-02-12 LAB — CBC WITH DIFFERENTIAL/PLATELET

## 2020-02-20 ENCOUNTER — Ambulatory Visit: Payer: BC Managed Care – PPO | Admitting: Osteopathic Medicine

## 2020-02-20 ENCOUNTER — Ambulatory Visit (INDEPENDENT_AMBULATORY_CARE_PROVIDER_SITE_OTHER): Payer: BC Managed Care – PPO | Admitting: Medical-Surgical

## 2020-02-20 VITALS — BP 108/75 | HR 92 | Wt 229.0 lb

## 2020-02-20 DIAGNOSIS — I2699 Other pulmonary embolism without acute cor pulmonale: Secondary | ICD-10-CM

## 2020-02-20 DIAGNOSIS — Z7901 Long term (current) use of anticoagulants: Secondary | ICD-10-CM

## 2020-02-20 MED ORDER — APIXABAN 5 MG PO TABS
ORAL_TABLET | ORAL | 1 refills | Status: DC
Start: 2020-02-20 — End: 2020-06-10

## 2020-02-20 NOTE — Progress Notes (Signed)
Subjective:    CC: PE follow up, headaches  HPI: Pleasant 64 year old female presenting today for follow-up from urgent care.  She was seen in urgent care on 9/20 where she was diagnosed with a pulmonary embolism.  They did start her on Eliquis at that time and she was discharged home.  The next day, she developed a severe headache and was advised to go to the emergency room.  She did go to the emergency room as directed but signed out AMA due to the unpredictable wait time.  She reports going home and taking 2 Tylenol and then lying down for a nap.  She notes that this was helpful and her headache resolved shortly after.  She continues to have a dry cough, fatigue, and shortness of breath on exertion.  Also endorses mild right chest discomfort intermittently.  Currently taking Eliquis 5 mg twice daily, tolerating well without side effects.  Denies fever, chills, palpitations, lower extremity edema, further headaches, and dizziness.  I reviewed the past medical history, family history, social history, surgical history, and allergies today and no changes were needed.  Please see the problem list section below in epic for further details.  Past Medical History: Past Medical History:  Diagnosis Date  . Anxiety   . Asthma 02/21/2018  . Back pain   . Depression   . GERD (gastroesophageal reflux disease)   . Hypertension    Past Surgical History: Past Surgical History:  Procedure Laterality Date  . ABDOMINAL HYSTERECTOMY    . BACK SURGERY    . SHOULDER ARTHROSCOPY W/ ROTATOR CUFF REPAIR    . SHOULDER OPEN ROTATOR CUFF REPAIR    . TUBAL LIGATION     Social History: Social History   Socioeconomic History  . Marital status: Married    Spouse name: Not on file  . Number of children: 2  . Years of education: Not on file  . Highest education level: Not on file  Occupational History  . Occupation: retired  Tobacco Use  . Smoking status: Passive Smoke Exposure - Never Smoker  . Smokeless  tobacco: Never Used  Vaping Use  . Vaping Use: Never used  Substance and Sexual Activity  . Alcohol use: Yes    Comment: occasionally  . Drug use: Never  . Sexual activity: Yes    Birth control/protection: Post-menopausal, Surgical    Comment: hysterectomy  Other Topics Concern  . Not on file  Social History Narrative  . Not on file   Social Determinants of Health   Financial Resource Strain:   . Difficulty of Paying Living Expenses: Not on file  Food Insecurity:   . Worried About Charity fundraiser in the Last Year: Not on file  . Ran Out of Food in the Last Year: Not on file  Transportation Needs:   . Lack of Transportation (Medical): Not on file  . Lack of Transportation (Non-Medical): Not on file  Physical Activity:   . Days of Exercise per Week: Not on file  . Minutes of Exercise per Session: Not on file  Stress:   . Feeling of Stress : Not on file  Social Connections:   . Frequency of Communication with Friends and Family: Not on file  . Frequency of Social Gatherings with Friends and Family: Not on file  . Attends Religious Services: Not on file  . Active Member of Clubs or Organizations: Not on file  . Attends Archivist Meetings: Not on file  . Marital Status: Not  on file   Family History: Family History  Problem Relation Age of Onset  . Cancer Mother        lung  . Cancer Father        lung  . Hypertension Father   . Diabetes Father   . Non-Hodgkin's lymphoma Son   . Colon cancer Maternal Aunt   . Esophageal cancer Maternal Uncle   . Rectal cancer Neg Hx   . Stomach cancer Neg Hx    Allergies: No Known Allergies Medications: See med rec.  Review of Systems: See HPI for pertinent positives and negatives.   Objective:    General: Well Developed, well nourished, and in no acute distress.  Neuro: Alert and oriented x3.  HEENT: Normocephalic, atraumatic.  Skin: Warm and dry. Cardiac: Regular rate and rhythm, no murmurs rubs or gallops,  no lower extremity edema.  Respiratory: Clear to auscultation bilaterally. Not using accessory muscles, speaking in full sentences.  Impression and Recommendations:    1. Acute pulmonary embolism without acute cor pulmonale, unspecified pulmonary embolism type (HCC) Continue Eliquis 5 mg twice daily.  She will likely be on this for at least 3 months as her PE was unprovoked.  Discussed emergency symptoms to monitor for including worsening shortness of breath, worsening chest pain, severe headache, severe abdominal pain, swelling/pain/erythema of the lower extremities, and stroke symptoms.  Advised patient that her mild dyspnea, chest pain, and fatigue will likely persist for several weeks but this should gradually begin to improve.  2. On continuous oral anticoagulation Discussed adherence to the twice daily regimen and its importance in treating her PE.  Also discussed bleeding precautions.  Return in about 4 weeks (around 03/19/2020) for PE follow up. ___________________________________________ Clearnce Sorrel, DNP, APRN, FNP-BC [facility]

## 2020-02-20 NOTE — Progress Notes (Signed)
Patient reports that her grandson was diagnosed with COVID yesterday.  She has not been around him in 2 weeks.    She still reports feeling weak and tired.  She reports having a "bad lung".

## 2020-02-20 NOTE — Patient Instructions (Signed)
Pulmonary Embolism  A pulmonary embolism (PE) is a sudden blockage or decrease of blood flow in one or both lungs. Most blockages come from a blood clot that forms in the vein of a lower leg, thigh, or arm (deep vein thrombosis, DVT) and travels to the lungs. A clot is blood that has thickened into a gel or solid. PE is a dangerous and life-threatening condition that needs to be treated right away. What are the causes? This condition is usually caused by a blood clot that forms in a vein and moves to the lungs. In rare cases, it may be caused by air, fat, part of a tumor, or other tissue that moves through the veins and into the lungs. What increases the risk? The following factors may make you more likely to develop this condition:  Experiencing a traumatic injury, such as breaking a hip or leg.  Having: ? A spinal cord injury. ? Orthopedic surgery, especially hip or knee replacement. ? Any major surgery. ? A stroke. ? DVT. ? Blood clots or blood clotting disease. ? Long-term (chronic) lung or heart disease. ? Cancer treated with chemotherapy. ? A central venous catheter.  Taking medicines that contain estrogen. These include birth control pills and hormone replacement therapy.  Being: ? Pregnant. ? In the period of time after your baby is delivered (postpartum). ? Older than age 60. ? Overweight. ? A smoker, especially if you have other risks. What are the signs or symptoms? Symptoms of this condition usually start suddenly and include:  Shortness of breath during activity or at rest.  Coughing, coughing up blood, or coughing up blood-tinged mucus.  Chest pain that is often worse with deep breaths.  Rapid or irregular heartbeat.  Feeling light-headed or dizzy.  Fainting.  Feeling anxious.  Fever.  Sweating.  Pain and swelling in a leg. This is a symptom of DVT, which can lead to PE. How is this diagnosed? This condition may be diagnosed based on:  Your medical  history.  A physical exam.  Blood tests.  CT pulmonary angiogram. This test checks blood flow in and around your lungs.  Ventilation-perfusion scan, also called a lung VQ scan. This test measures air flow and blood flow to the lungs.  An ultrasound of the legs. How is this treated? Treatment for this condition depends on many factors, such as the cause of your PE, your risk for bleeding or developing more clots, and other medical conditions you have. Treatment aims to remove, dissolve, or stop blood clots from forming or growing larger. Treatment may include:  Medicines, such as: ? Blood thinning medicines (anticoagulants) to stop clots from forming. ? Medicines that dissolve clots (thrombolytics).  Procedures, such as: ? Using a flexible tube to remove a blood clot (embolectomy) or to deliver medicine to destroy it (catheter-directed thrombolysis). ? Inserting a filter into a large vein that carries blood to the heart (inferior vena cava). This filter (vena cava filter) catches blood clots before they reach the lungs. ? Surgery to remove the clot (surgical embolectomy). This is rare. You may need a combination of immediate, long-term (up to 3 months after diagnosis), and extended (more than 3 months after diagnosis) treatments. Your treatment may continue for several months (maintenance therapy). You and your health care provider will work together to choose the treatment program that is best for you. Follow these instructions at home: Medicines  Take over-the-counter and prescription medicines only as told by your health care provider.  If you   are taking an anticoagulant medicine: ? Take the medicine every day at the same time each day. ? Understand what foods and drugs interact with your medicine. ? Understand the side effects of this medicine, including excessive bruising or bleeding. Ask your health care provider or pharmacist about other side effects. General  instructions  Wear a medical alert bracelet or carry a medical alert card that says you have had a PE and lists what medicines you take.  Ask your health care provider when you may return to your normal activities. Avoid sitting or lying for a long time without moving.  Maintain a healthy weight. Ask your health care provider what weight is healthy for you.  Do not use any products that contain nicotine or tobacco, such as cigarettes, e-cigarettes, and chewing tobacco. If you need help quitting, ask your health care provider.  Talk with your health care provider about any travel plans. It is important to make sure that you are still able to take your medicine while on trips.  Keep all follow-up visits as told by your health care provider. This is important. Contact a health care provider if:  You missed a dose of your blood thinner medicine. Get help right away if:  You have: ? New or increased pain, swelling, warmth, or redness in an arm or leg. ? Numbness or tingling in an arm or leg. ? Shortness of breath during activity or at rest. ? A fever. ? Chest pain. ? A rapid or irregular heartbeat. ? A severe headache. ? Vision changes. ? A serious fall or accident, or you hit your head. ? Stomach (abdominal) pain. ? Blood in your vomit, stool, or urine. ? A cut that will not stop bleeding.  You cough up blood.  You feel light-headed or dizzy.  You cannot move your arms or legs.  You are confused or have memory loss. These symptoms may represent a serious problem that is an emergency. Do not wait to see if the symptoms will go away. Get medical help right away. Call your local emergency services (911 in the [place] Do not drive yourself to the hospital. Summary  A pulmonary embolism (PE) is a sudden blockage or decrease of blood flow in one or both lungs. PE is a dangerous and life-threatening condition that needs to be treated right away.  Treatments for this condition usually  include medicines to thin your blood (anticoagulants) or medicines to break apart blood clots (thrombolytics).  If you are given blood thinners, it is important to take the medicine every day at the same time each day.  Understand what foods and drugs interact with any medicines that you are taking.  If you have signs of PE or DVT, call your local emergency services (911 in the [place] This information is not intended to replace advice given to you by your health care provider. Make sure you discuss any questions you have with your health care provider. Document Revised: 02/13/2018 Document Reviewed: 02/13/2018 Elsevier Patient Education  2020 [place].  Apixaban oral tablets What is this medicine? APIXABAN (a PIX a ban) is an anticoagulant (blood thinner). It is used to lower the chance of stroke in people with a medical condition called atrial fibrillation. It is also used to treat or prevent blood clots in the lungs or in the veins. This medicine may be used for other purposes; ask your health care provider or pharmacist if you have questions. COMMON BRAND NAME(S): Eliquis What should I tell my  health care provider before I take this medicine? They need to know if you have any of these conditions:  antiphospholipid antibody syndrome  bleeding disorders  bleeding in the brain  blood in your stools (black or tarry stools) or if you have blood in your vomit  history of blood clots  history of stomach bleeding  kidney disease  liver disease  mechanical heart valve  an unusual or allergic reaction to apixaban, other medicines, foods, dyes, or preservatives  pregnant or trying to get pregnant  breast-feeding How should I use this medicine? Take this medicine by mouth with a glass of water. Follow the directions on the prescription label. You can take it with or without food. If it upsets your stomach, take it with food. Take your medicine at regular intervals. Do not take  it more often than directed. Do not stop taking except on your doctor's advice. Stopping this medicine may increase your risk of a blood clot. Be sure to refill your prescription before you run out of medicine. Talk to your pediatrician regarding the use of this medicine in children. Special care may be needed. Overdosage: If you think you have taken too much of this medicine contact a poison control center or emergency room at once. NOTE: This medicine is only for you. Do not share this medicine with others. What if I miss a dose? If you miss a dose, take it as soon as you can. If it is almost time for your next dose, take only that dose. Do not take double or extra doses. What may interact with this medicine? This medicine may interact with the following:  aspirin and aspirin-like medicines  certain medicines for fungal infections like ketoconazole and itraconazole  certain medicines for seizures like carbamazepine and phenytoin  certain medicines that treat or prevent blood clots like warfarin, enoxaparin, and dalteparin  clarithromycin  NSAIDs, medicines for pain and inflammation, like ibuprofen or naproxen  rifampin  ritonavir  St. John's wort This list may not describe all possible interactions. Give your health care provider a list of all the medicines, herbs, non-prescription drugs, or dietary supplements you use. Also tell them if you smoke, drink alcohol, or use illegal drugs. Some items may interact with your medicine. What should I watch for while using this medicine? Visit your healthcare professional for regular checks on your progress. You may need blood work done while you are taking this medicine. Your condition will be monitored carefully while you are receiving this medicine. It is important not to miss any appointments. Avoid sports and activities that might cause injury while you are using this medicine. Severe falls or injuries can cause unseen bleeding. Be careful  when using sharp tools or knives. Consider using an Copy. Take special care brushing or flossing your teeth. Report any injuries, bruising, or red spots on the skin to your healthcare professional. If you are going to need surgery or other procedure, tell your healthcare professional that you are taking this medicine. Wear a medical ID bracelet or chain. Carry a card that describes your disease and details of your medicine and dosage times. What side effects may I notice from receiving this medicine? Side effects that you should report to your doctor or health care professional as soon as possible:  allergic reactions like skin rash, itching or hives, swelling of the face, lips, or tongue  signs and symptoms of bleeding such as bloody or black, tarry stools; red or dark-brown urine; spitting up  blood or brown material that looks like coffee grounds; red spots on the skin; unusual bruising or bleeding from the eye, gums, or nose  signs and symptoms of a blood clot such as chest pain; shortness of breath; pain, swelling, or warmth in the leg  signs and symptoms of a stroke such as changes in vision; confusion; trouble speaking or understanding; severe headaches; sudden numbness or weakness of the face, arm or leg; trouble walking; dizziness; loss of coordination This list may not describe all possible side effects. Call your doctor for medical advice about side effects. You may report side effects to FDA at 1-800-FDA-1088. Where should I keep my medicine? Keep out of the reach of children. Store at room temperature between 20 and 25 degrees C (68 and 77 degrees F). Throw away any unused medicine after the expiration date. NOTE: This sheet is a summary. It may not cover all possible information. If you have questions about this medicine, talk to your doctor, pharmacist, or health care provider.  2020 Elsevier/Gold Standard (2018-01-16 17:39:34)

## 2020-03-18 ENCOUNTER — Other Ambulatory Visit: Payer: Self-pay

## 2020-03-18 ENCOUNTER — Encounter: Payer: Self-pay | Admitting: Osteopathic Medicine

## 2020-03-18 ENCOUNTER — Ambulatory Visit (INDEPENDENT_AMBULATORY_CARE_PROVIDER_SITE_OTHER): Payer: BC Managed Care – PPO | Admitting: Osteopathic Medicine

## 2020-03-18 VITALS — BP 122/81 | HR 88 | Temp 98.0°F | Wt 225.1 lb

## 2020-03-18 DIAGNOSIS — I2699 Other pulmonary embolism without acute cor pulmonale: Secondary | ICD-10-CM | POA: Diagnosis not present

## 2020-03-18 DIAGNOSIS — J986 Disorders of diaphragm: Secondary | ICD-10-CM | POA: Diagnosis not present

## 2020-03-18 MED ORDER — LORATADINE 10 MG PO TABS
10.0000 mg | ORAL_TABLET | Freq: Every day | ORAL | 3 refills | Status: DC
Start: 2020-03-18 — End: 2021-06-14

## 2020-03-18 MED ORDER — CELECOXIB 100 MG PO CAPS: 100.0000 mg | ORAL_CAPSULE | Freq: Two times a day (BID) | ORAL | 1 refills | Status: DC

## 2020-03-18 MED ORDER — OMEPRAZOLE 40 MG PO CPDR
40.0000 mg | DELAYED_RELEASE_CAPSULE | Freq: Two times a day (BID) | ORAL | 1 refills | Status: DC
Start: 2020-03-18 — End: 2020-09-13

## 2020-03-18 NOTE — Progress Notes (Signed)
[name] is a 64 y.o. female who presents to  [facility] [facility]  today, 03/18/20, seeking care for the following:  . Follow-up pulmonary embolus o Diagnosed 02/09/2020. Unprovoked. No prior hx VTE/PE/DVT. Was started Eliquis 5 mg bid  o Hx elevated R hemidiaphragm, reviewed imaging, this has been present since at least 2007 but appears to have advanced somewhat since then. PE was noted on R side.  o Pt reports SOB has resolved, no CP     ASSESSMENT & PLAN with other pertinent findings:  The primary encounter diagnosis was Elevated hemidiaphragm. A diagnosis of Acute pulmonary embolism, unspecified pulmonary embolism type, unspecified whether acute cor pulmonale present [facility]) was also pertinent to this visit.   No results found for this or any previous visit (from the past 24 hour(s)).     Patient Instructions  TO ER IF Severe abdominal pain w/ black/bloody stool, or throwing up blood or stuff that looks like coffee grounds  RX; Stay on the Eliquis Restart Celebrex WITH Omperazole Try 100 mg twice daily of Celebrex  If needed, can go up to 200 mg twice daily (Celebrex can predispose to GI bleed which is a problem on blood thinners - so we are trying to minimize risk of GI bleed or stomach ulcer).   Will get you back in to see Pulmonary - I'd like their opinion on whether we should do anything about this hemidiaphragm elevation as it relates to the blood clot       Orders Placed This Encounter  Procedures  . Ambulatory referral to Pulmonology    Meds ordered this encounter  Medications  . omeprazole (PRILOSEC) 40 MG capsule    Sig: Take 1 capsule (40 mg total) by mouth in the morning and at bedtime.    Dispense:  180 capsule    Refill:  1  . celecoxib (CELEBREX) 100 MG capsule    Sig: Take 1-2 capsules (100-200 mg total) by mouth 2 (two) times daily.    Dispense:  180 capsule    Refill:  1  . loratadine  (CLARITIN) 10 MG tablet    Sig: Take 1 tablet (10 mg total) by mouth daily.    Dispense:  90 tablet    Refill:  3       Follow-up instructions: Return if symptoms worsen or fail to improve, for MEDICARE WELLNESS W/ NURSE WHEN DUE .                                         BP 122/81 (BP Location: Left Arm, Patient Position: Sitting, Cuff Size: Large)   Pulse 88   Temp 98 F (36.7 C) (Oral)   Wt 225 lb 1.9 oz (102.1 kg)   BMI 39.88 kg/m   Current Meds  Medication Sig  . albuterol (PROVENTIL HFA;VENTOLIN HFA) 108 (90 Base) MCG/ACT inhaler Inhale 2 puffs into the lungs every 6 (six) hours as needed for wheezing or shortness of breath.  Marland Kitchen apixaban (ELIQUIS) 5 MG TABS tablet Take 2 tablets (10mg ) twice daily for 7 days, then 1 tablet (5mg ) twice daily  . buPROPion (WELLBUTRIN XL) 300 MG 24 hr tablet Take by mouth daily.   . CASCARA SAGRADA PO Take by mouth.  . celecoxib (CELEBREX) 100 MG capsule Take 1-2 capsules (100-200 mg total) by mouth 2 (two) times daily.  Marland Kitchen  DULoxetine (CYMBALTA) 60 MG capsule Take 60 mg by mouth daily.  Marland Kitchen estradiol (ESTRACE) 1 MG tablet Take 1 mg by mouth daily.  Marland Kitchen loratadine (CLARITIN) 10 MG tablet Take 1 tablet (10 mg total) by mouth daily.  Marland Kitchen omeprazole (PRILOSEC) 40 MG capsule Take 1 capsule (40 mg total) by mouth in the morning and at bedtime.  . pregabalin (LYRICA) 50 MG capsule Take 50 mg by mouth 3 (three) times daily.   . vitamin E 1000 UNIT capsule Take by mouth.  . [DISCONTINUED] celecoxib (CELEBREX) 200 MG capsule Take 1 capsule by mouth 2 (two) times daily.  . [DISCONTINUED] loratadine (CLARITIN) 10 MG tablet TAKE 1 TABLET BY MOUTH EVERY DAY  . [DISCONTINUED] omeprazole (PRILOSEC) 40 MG capsule Take 40 mg by mouth 2 (two) times a day.   Current Facility-Administered Medications for the 03/18/20 encounter (Office Visit) with Emeterio Reeve, DO  Medication  . 0.9 %  sodium chloride infusion    No results  found for this or any previous visit (from the past 72 hour(s)).  No results found.     All questions at time of visit were answered - patient instructed to contact office with any additional concerns or updates.  ER/RTC precautions were reviewed with the patient as applicable.   Please note: voice recognition software was used to produce this document, and typos may escape review. Please contact Dr. Sheppard Coil for any needed clarifications.

## 2020-03-18 NOTE — Patient Instructions (Signed)
TO ER IF Severe abdominal pain w/ black/bloody stool, or throwing up blood or stuff that looks like coffee grounds  RX; Stay on the Eliquis Restart Celebrex WITH Omperazole Try 100 mg twice daily of Celebrex  If needed, can go up to 200 mg twice daily (Celebrex can predispose to GI bleed which is a problem on blood thinners - so we are trying to minimize risk of GI bleed or stomach ulcer).   Will get you back in to see Pulmonary - I'd like their opinion on whether we should do anything about this hemidiaphragm elevation as it relates to the blood clot

## 2020-03-26 ENCOUNTER — Other Ambulatory Visit: Payer: Self-pay

## 2020-03-26 ENCOUNTER — Ambulatory Visit: Payer: PPO | Admitting: Pulmonary Disease

## 2020-03-26 ENCOUNTER — Encounter: Payer: Self-pay | Admitting: Pulmonary Disease

## 2020-03-26 VITALS — BP 120/72 | HR 78 | Temp 97.3°F | Ht 64.0 in | Wt 225.0 lb

## 2020-03-26 DIAGNOSIS — Z86711 Personal history of pulmonary embolism: Secondary | ICD-10-CM | POA: Insufficient documentation

## 2020-03-26 DIAGNOSIS — J986 Disorders of diaphragm: Secondary | ICD-10-CM | POA: Diagnosis not present

## 2020-03-26 DIAGNOSIS — J4520 Mild intermittent asthma, uncomplicated: Secondary | ICD-10-CM

## 2020-03-26 DIAGNOSIS — Z79890 Hormone replacement therapy (postmenopausal): Secondary | ICD-10-CM | POA: Diagnosis not present

## 2020-03-26 DIAGNOSIS — Z9189 Other specified personal risk factors, not elsewhere classified: Secondary | ICD-10-CM

## 2020-03-26 HISTORY — DX: Personal history of pulmonary embolism: Z86.711

## 2020-03-26 NOTE — Assessment & Plan Note (Signed)
Clinical history of asthma Patient with intermittent coughing episodes as well as wheezing Chart review in 2019 states the patient was tried on Symbicort and she did not find this helpful When discussing with patient today she reports that she may have actually had clinical benefit with Symbicort She has a rescue inhaler but she has not been using it Breath sounds clear to auscultation today  Plan: Encourage patient to utilize albuterol if she starts having shortness of breath or wheezing If patient is having more often use of albuterol -could consider trial of ICS/LABA inhaler

## 2020-03-26 NOTE — Patient Instructions (Addendum)
You were seen today by Lauraine Rinne, NP  for:   1. Paralyzed hemidiaphragm  We will continue to clinically monitor this  I'll discuss with the upcoming pulmonary providers that you'll establish with to see if they think this may have contributed to the potential of you having a VTE event  2. Mild intermittent asthma without complication  Only use your albuterol as a rescue medication to be used if you can't catch your breath by resting or doing a relaxed purse lip breathing pattern.  - The less you use it, the better it will work when you need it. - Ok to use up to 2 puffs  every 4 hours if you must but call for immediate appointment if use goes up over your usual need - Don't leave home without it !!  (think of it like the spare tire for your car)   3. At risk for obstructive sleep apnea  - Home sleep test; Future  4. Hormone replacement therapy (postmenopausal)  Even at lower doses hormone replacement therapies can but is at high risk of VTE events as discussed today  May need be worth discussing with primary care to see if he would consider trialing coming off of the next couple of months  5. History of PE   Continue Eliquis  Notify our office if the Eliquis cost becomes significantly more now that you switch to Medicare  We will also refer you to hematology  We recommend today:  Orders Placed This Encounter  Procedures  . Home sleep test    Standing Status:   Future    Standing Expiration Date:   03/26/2021    Order Specific Question:   Where should this test be performed:    Answer:   [facility] - Pulmonary   Orders Placed This Encounter  Procedures  . Home sleep test   No orders of the defined types were placed in this encounter.   Follow Up:    Return in about 2 months (around 05/26/2020), or if symptoms worsen or fail to improve, for NEW PULMONOLOGIST IN 5min SLOT. Dr. Silas Flood or Dr. Erin Fulling  Notification of test results are managed in the following manner: If  there are  any recommendations or changes to the  plan of care discussed in office today,  we will contact you and let you know what they are. If you do not hear from [place], then your results are normal and you can view them through your  MyChart account , or a letter will be sent to you. Thank you again for trusting [place] with your care  - Thank you, [facility]    It is flu season:   >>> Best ways to protect herself from the flu: Receive the yearly flu vaccine, practice good hand hygiene washing with soap and also using hand sanitizer when available, eat a nutritious meals, get adequate rest, hydrate appropriately       Please contact the office if your symptoms worsen or you have concerns that you are not improving.   Thank you for choosing [facility] for your healthcare, and for allowing [place] to partner with you on your healthcare journey. I am thankful to be able to provide care to you today.   Wyn Quaker FNP-C

## 2020-03-26 NOTE — Assessment & Plan Note (Signed)
Plan: Home sleep study ordered 

## 2020-03-26 NOTE — Progress Notes (Signed)
@Patient  ID: [name], female    DOB: [birth year], 64 y.o.   MRN: [id number]  Chief Complaint  Patient presents with  . Follow-up    Paralyzed Hemidiaphragm, PE, feeling better    Referring provider: Emeterio Reeve, DO  HPI:  64 year old female never smoker followed in our office for asthma, paralyzed hemidiaphragm, history of pulmonary embolism (September/2021)  PMH: Anxiety, depression, hyperlipidemia, postmenopausal-on HRT, dysphagia Smoker/ Smoking History: Never smoker Maintenance: None Pt of: Previous patient of Dr. Lake Bells  03/26/2020  - Visit   64 year old female never smoker with passive smoke exposure presenting back to our office today.  Previously established with Dr. Lake Bells.  Last seen in office in November/2019 with TP NP.  Plan of care from that office visit was for her to have follow-up in 6 months with a chest x-ray for her paralyzed right hemidiaphragm.  Still with a questionable etiology.  This is chronic finding.  She was tried on Symbicort in the past for management of asthma due to her intermittent cough and wheezing.  She did not see a clinical benefit.   Patient presenting today as a follow-up at the request of primary care.  Patient was seen by primary care in October/2021 the assessment and plan from that office visit is listed below:  [employer] [place] with other pertinent findings:  The primary encounter diagnosis was Elevated hemidiaphragm. A diagnosis of Acute pulmonary embolism, unspecified pulmonary embolism type, unspecified whether acute cor pulmonale present [facility]) was also pertinent to this visit.   Lab Results Last 24 Hours  No results found for this or any previous visit (from the past 24 hour(s)).   Patient Instructions  TO ER IF Severe abdominal pain w/ black/bloody stool, or throwing up blood or stuff that looks like coffee grounds  RX; Stay on the Eliquis Restart Celebrex WITH Omperazole Try 100 mg twice daily of Celebrex   If needed, can go up to 200 mg twice daily (Celebrex can predispose to GI bleed which is a problem on blood thinners - so we are trying to minimize risk of GI bleed or stomach ulcer).   Will get you back in to see Pulmonary - I'd like their opinion on whether we should do anything about this hemidiaphragm elevation as it relates to the blood clot  Patient's PE was diagnosed in September/2021.  Considered unprovoked.  No prior history of VTE events.  Patient is on low-dose HRT as she is postmenopausal.  She is trial coming off of the hormonal replacement therapies in the past and she has not been able to tolerate.  She reports she has not trialed coming off of the HRT in the last couple of years.  She is willing to do this.  We will discuss it today.  Patient denies any recent surgical interventions or debilitating bedrest prior to September/2021 pulmonary embolism.  Patient does have multiple family members with history of VTE events.  Her father had multiple DVTs in his lifetime.  Also her paternal uncles also had multiple DVTs.  She is unaware if anybody in her family has ever had a pulmonary embolism.  She believes that the DVTs for both her dad as well as her paternal uncles were considered unprovoked.  Patient feels she is getting back to baseline after treatment for her pulmonary embolism.  She reports prior to this she was doing multiple activities.  Did not struggle with shortness of breath.  She does have a rescue inhaler.  She is unsure  if she can use this.  She would like to discuss this today.  She is up-to-date with her COVID-19 vaccinations.  Questionaires / Pulmonary Flowsheets:   ACT:  No flowsheet data found.  MMRC: mMRC Dyspnea Scale mMRC Score  03/26/2020 0    Epworth:  No flowsheet data found.  Tests:   02/09/2020-CTA chest-small pulmonary embolus seen within the right upper lobe pulmonary artery, no evidence of heart strain, stable chronic elevation of right  hemodiaphragm  02/15/2018-sniff test-paralysis of elevated right hemidiaphragm  02/01/2018-CT chest with contrast-right middle and lower lobe atelectasis likely related to significant right hemidiaphragm elevation, these changes are new from prior plain film correspond with those seen on recent chest x-ray  02/01/2018-chest x-ray-stable elevated right hemidiaphragm with mild right basilar subsegmental atelectasis  FENO:  No results found for: NITRICOXIDE  PFT: PFT Results Latest Ref Rng & Units 02/13/2018  FVC-Pre L 1.89  FVC-Predicted Pre % 60  FVC-Post L 2.03  FVC-Predicted Post % 65  Pre FEV1/FVC % % 82  Post FEV1/FCV % % 87  FEV1-Pre L 1.56  FEV1-Predicted Pre % 65  FEV1-Post L 1.76  DLCO uncorrected ml/min/mmHg 20.39  DLCO UNC% % 88  DLCO corrected ml/min/mmHg 19.65  DLCO COR %Predicted % 85  DLVA Predicted % 133  TLC L 3.54  TLC % Predicted % 72  RV % Predicted % 75    WALK:  No flowsheet data found.  Imaging: No results found.  Lab Results:  CBC    Component Value Date/Time   WBC CANCELED 02/09/2020 1415   RBC 4.57 02/01/2018 1205   HGB 14.7 02/01/2018 1205   HCT 44.0 02/01/2018 1205   PLT 311 02/01/2018 1205   MCV 96.3 02/01/2018 1205   MCH 32.2 02/01/2018 1205   MCHC 33.4 02/01/2018 1205   RDW 12.0 02/01/2018 1205    BMET    Component Value Date/Time   NA 136 02/09/2020 1415   K 4.2 02/09/2020 1415   CL 103 02/09/2020 1415   CO2 23 02/09/2020 1415   GLUCOSE 80 02/09/2020 1415   BUN 10 02/09/2020 1415   CREATININE 0.78 02/09/2020 1415   CALCIUM 8.4 (L) 02/09/2020 1415   GFRNONAA 50 (L) 02/01/2018 1205   GFRAA 58 (L) 02/01/2018 1205    BNP    Component Value Date/Time   BNP 26 02/01/2018 1205    ProBNP No results found for: PROBNP  Specialty Problems      Pulmonary Problems   Atelectasis   Elevated hemidiaphragm   Asthma   Paralyzed hemidiaphragm      No Known Allergies  Immunization History  Administered Date(s)  Administered  . Moderna SARS-COVID-2 Vaccination 08/21/2019, 09/16/2019  . Tdap 11/12/2019    Past Medical History:  Diagnosis Date  . Anxiety   . Asthma 02/21/2018  . Back pain   . Depression   . GERD (gastroesophageal reflux disease)   . Hypertension     Tobacco History: Social History   Tobacco Use  Smoking Status Passive Smoke Exposure - Never Smoker  Smokeless Tobacco Never Used   Counseling given: Yes   Continue to not smoke  Outpatient Encounter Medications as of 03/26/2020  Medication Sig  . albuterol (PROVENTIL HFA;VENTOLIN HFA) 108 (90 Base) MCG/ACT inhaler Inhale 2 puffs into the lungs every 6 (six) hours as needed for wheezing or shortness of breath.  Marland Kitchen apixaban (ELIQUIS) 5 MG TABS tablet Take 2 tablets (10mg ) twice daily for 7 days, then 1 tablet (5mg ) twice daily  .  buPROPion (WELLBUTRIN XL) 300 MG 24 hr tablet Take by mouth daily.   . CASCARA SAGRADA PO Take by mouth.  . celecoxib (CELEBREX) 100 MG capsule Take 1-2 capsules (100-200 mg total) by mouth 2 (two) times daily.  Marland Kitchen estradiol (ESTRACE) 1 MG tablet Take 1 mg by mouth daily.  Marland Kitchen loratadine (CLARITIN) 10 MG tablet Take 1 tablet (10 mg total) by mouth daily.  Marland Kitchen omeprazole (PRILOSEC) 40 MG capsule Take 1 capsule (40 mg total) by mouth in the morning and at bedtime.  . pregabalin (LYRICA) 50 MG capsule Take 50 mg by mouth 3 (three) times daily.   . vitamin E 1000 UNIT capsule Take by mouth.  . [DISCONTINUED] budesonide-formoterol (SYMBICORT) 80-4.5 MCG/ACT inhaler Inhale 2 puffs into the lungs 2 (two) times daily. (Patient not taking: Reported on 03/18/2020)  . [DISCONTINUED] DULoxetine (CYMBALTA) 60 MG capsule Take 60 mg by mouth daily.  . [DISCONTINUED] mupirocin ointment (BACTROBAN) 2 % Apply 1 application topically 2 (two) times daily. (Patient not taking: Reported on 03/18/2020)  . [DISCONTINUED] ondansetron (ZOFRAN) 4 MG tablet Take by mouth. (Patient not taking: Reported on 03/18/2020)    Facility-Administered Encounter Medications as of 03/26/2020  Medication  . 0.9 %  sodium chloride infusion     Review of Systems  Review of Systems  Constitutional: Negative for activity change, fatigue and fever.  HENT: Negative for sinus pressure, sinus pain and sore throat.   Respiratory: Positive for shortness of breath (improved with doac after PE ). Negative for cough and wheezing.   Cardiovascular: Negative for chest pain and palpitations.  Gastrointestinal: Negative for diarrhea, nausea and vomiting.  Musculoskeletal: Negative for arthralgias.  Neurological: Negative for dizziness.  Psychiatric/Behavioral: Negative for sleep disturbance. The patient is not nervous/anxious.      Physical Exam  BP 120/72 (BP Location: Left Arm, Cuff Size: Normal)   Pulse 78   Temp (!) 97.3 F (36.3 C) (Oral)   Ht 5\' 4"  (1.626 m)   Wt 225 lb (102.1 kg)   SpO2 98%   BMI 38.62 kg/m   Wt Readings from Last 5 Encounters:  03/26/20 225 lb (102.1 kg)  03/18/20 225 lb 1.9 oz (102.1 kg)  02/20/20 229 lb (103.9 kg)  02/09/20 228 lb (103.4 kg)  12/12/18 234 lb (106.1 kg)    BMI Readings from Last 5 Encounters:  03/26/20 38.62 kg/m  03/18/20 39.88 kg/m  02/20/20 40.57 kg/m  02/09/20 40.39 kg/m  12/12/18 40.17 kg/m     Physical Exam Vitals and nursing note reviewed.  Constitutional:      General: She is not in acute distress.    Appearance: Normal appearance. She is obese.  HENT:     Head: Normocephalic and atraumatic.     Right Ear: Tympanic membrane, ear canal and external ear normal. There is no impacted cerumen.     Left Ear: Tympanic membrane, ear canal and external ear normal. There is no impacted cerumen.     Nose: Rhinorrhea present. No congestion.     Mouth/Throat:     Mouth: Mucous membranes are moist.     Pharynx: Oropharynx is clear.     Comments: +PND Eyes:     Pupils: Pupils are equal, round, and reactive to light.  Cardiovascular:     Rate and Rhythm:  Normal rate and regular rhythm.     Pulses: Normal pulses.     Heart sounds: Normal heart sounds. No murmur heard.   Pulmonary:     Effort: Pulmonary effort  is normal. No respiratory distress.     Breath sounds: No decreased air movement. Examination of the right-lower field reveals decreased breath sounds. Decreased breath sounds present. No wheezing or rales.  Musculoskeletal:     Cervical back: Normal range of motion.  Skin:    General: Skin is warm and dry.     Capillary Refill: Capillary refill takes less than 2 seconds.  Neurological:     General: No focal deficit present.     Mental Status: She is alert and oriented to person, place, and time. Mental status is at baseline.     Gait: Gait normal.  Psychiatric:        Mood and Affect: Mood normal.        Behavior: Behavior normal.        Thought Content: Thought content normal.        Judgment: Judgment normal.       Assessment & Plan:   Paralyzed hemidiaphragm Plan: We will continue to monitor We will discuss case with Dr. Erin Fulling Reviewed case with Dr. Elsworth Soho, will proceed forward with home sleep study to rule out obstructive sleep apnea Patient may have clinical benefit of Pap therapy   Asthma Clinical history of asthma Patient with intermittent coughing episodes as well as wheezing Chart review in 2019 states the patient was tried on Symbicort and she did not find this helpful When discussing with patient today she reports that she may have actually had clinical benefit with Symbicort She has a rescue inhaler but she has not been using it Breath sounds clear to auscultation today  Plan: Encourage patient to utilize albuterol if she starts having shortness of breath or wheezing If patient is having more often use of albuterol -could consider trial of ICS/LABA inhaler  Hormone replacement therapy (postmenopausal) Plan: We will review case with pharmacy team Encourage patient discussed with primary care to see if  they would be agreeable to having a trial of her coming off of HRT given the VTE risk   History of pulmonary embolus (PE) Pulmonary embolism in September/2021 Potentially considered unprovoked although patient is on HRT Familial history of VTE events-Father and paternal uncles (multiple DVTs)  Plan: Continue Eliquis Contact our office if the [place] cost becomes more expensive with you transitioning to Medicare Referral to hematology   At risk for obstructive sleep apnea Plan: Home sleep study ordered    Return in about 2 months (around 05/26/2020), or if symptoms worsen or fail to improve, for NEW PULMONOLOGIST IN 52min SLOT.   Lauraine Rinne, NP 03/26/2020   This appointment required 32 minutes of patient care (this includes precharting, chart review, review of results, face-to-face care, etc.).

## 2020-03-26 NOTE — Assessment & Plan Note (Signed)
Plan: We will review case with pharmacy team Encourage patient discussed with primary care to see if they would be agreeable to having a trial of her coming off of HRT given the VTE risk

## 2020-03-26 NOTE — Assessment & Plan Note (Signed)
Plan: We will continue to monitor We will discuss case with Dr. Erin Fulling Reviewed case with Dr. Elsworth Soho, will proceed forward with home sleep study to rule out obstructive sleep apnea Patient may have clinical benefit of Pap therapy

## 2020-03-26 NOTE — Assessment & Plan Note (Signed)
Pulmonary embolism in September/2021 Potentially considered unprovoked although patient is on HRT Familial history of VTE events-Father and paternal uncles (multiple DVTs)  Plan: Continue Eliquis Contact our office if the [place] cost becomes more expensive with you transitioning to Medicare Referral to hematology

## 2020-04-01 ENCOUNTER — Telehealth: Payer: Self-pay | Admitting: Family

## 2020-04-01 NOTE — Telephone Encounter (Signed)
New Patient appointment scheduled letter & calendar mailed to patient.   

## 2020-04-20 ENCOUNTER — Other Ambulatory Visit: Payer: Self-pay

## 2020-04-20 ENCOUNTER — Inpatient Hospital Stay: Payer: PPO | Attending: Hematology & Oncology

## 2020-04-20 ENCOUNTER — Other Ambulatory Visit: Payer: Self-pay | Admitting: Family

## 2020-04-20 ENCOUNTER — Inpatient Hospital Stay (HOSPITAL_BASED_OUTPATIENT_CLINIC_OR_DEPARTMENT_OTHER): Payer: PPO | Admitting: Family

## 2020-04-20 VITALS — BP 140/90

## 2020-04-20 DIAGNOSIS — Z808 Family history of malignant neoplasm of other organs or systems: Secondary | ICD-10-CM | POA: Diagnosis not present

## 2020-04-20 DIAGNOSIS — Z96653 Presence of artificial knee joint, bilateral: Secondary | ICD-10-CM | POA: Diagnosis not present

## 2020-04-20 DIAGNOSIS — Z801 Family history of malignant neoplasm of trachea, bronchus and lung: Secondary | ICD-10-CM

## 2020-04-20 DIAGNOSIS — Z800 Family history of malignant neoplasm of digestive organs: Secondary | ICD-10-CM

## 2020-04-20 DIAGNOSIS — Z86711 Personal history of pulmonary embolism: Secondary | ICD-10-CM

## 2020-04-20 DIAGNOSIS — Z8249 Family history of ischemic heart disease and other diseases of the circulatory system: Secondary | ICD-10-CM | POA: Diagnosis not present

## 2020-04-20 DIAGNOSIS — Z807 Family history of other malignant neoplasms of lymphoid, hematopoietic and related tissues: Secondary | ICD-10-CM | POA: Diagnosis not present

## 2020-04-20 DIAGNOSIS — I2699 Other pulmonary embolism without acute cor pulmonale: Secondary | ICD-10-CM

## 2020-04-20 DIAGNOSIS — Z7901 Long term (current) use of anticoagulants: Secondary | ICD-10-CM | POA: Insufficient documentation

## 2020-04-20 DIAGNOSIS — Z7289 Other problems related to lifestyle: Secondary | ICD-10-CM

## 2020-04-20 DIAGNOSIS — Z8041 Family history of malignant neoplasm of ovary: Secondary | ICD-10-CM | POA: Insufficient documentation

## 2020-04-20 DIAGNOSIS — R6889 Other general symptoms and signs: Secondary | ICD-10-CM | POA: Diagnosis not present

## 2020-04-20 LAB — CMP (CANCER CENTER ONLY)
ALT: 24 U/L (ref 0–44)
AST: 39 U/L (ref 15–41)
Albumin: 3.7 g/dL (ref 3.5–5.0)
Alkaline Phosphatase: 70 U/L (ref 38–126)
Anion gap: 11 (ref 5–15)
BUN: 10 mg/dL (ref 8–23)
CO2: 25 mmol/L (ref 22–32)
Calcium: 9 mg/dL (ref 8.9–10.3)
Chloride: 102 mmol/L (ref 98–111)
Creatinine: 0.86 mg/dL (ref 0.44–1.00)
GFR, Estimated: >60 mL/min (ref >60)
Glucose, Bld: 83 mg/dL (ref 70–99)
Potassium: 3.4 mmol/L — ABNORMAL LOW (ref 3.5–5.1)
Sodium: 138 mmol/L (ref 135–145)
Total Bilirubin: 0.6 mg/dL (ref 0.3–1.2)
Total Protein: 7.1 g/dL (ref 6.5–8.1)

## 2020-04-20 LAB — CBC WITH DIFFERENTIAL (CANCER CENTER ONLY)
Abs Immature Granulocytes: 0.06 10*3/uL (ref 0.00–0.07)
Basophils Absolute: 0 10*3/uL (ref 0.0–0.1)
Basophils Relative: 0 %
Eosinophils Absolute: 0.1 10*3/uL (ref 0.0–0.5)
Eosinophils Relative: 1 %
HCT: 43.8 % (ref 36.0–46.0)
Hemoglobin: 14.2 g/dL (ref 12.0–15.0)
Immature Granulocytes: 1 %
Lymphocytes Relative: 33 %
Lymphs Abs: 2 10*3/uL (ref 0.7–4.0)
MCH: 31.9 pg (ref 26.0–34.0)
MCHC: 32.4 g/dL (ref 30.0–36.0)
MCV: 98.4 fL (ref 80.0–100.0)
Monocytes Absolute: 0.4 10*3/uL (ref 0.1–1.0)
Monocytes Relative: 6 %
Neutro Abs: 3.5 10*3/uL (ref 1.7–7.7)
Neutrophils Relative %: 59 %
Platelet Count: 253 10*3/uL (ref 150–400)
RBC: 4.45 MIL/uL (ref 3.87–5.11)
RDW: 14.6 % (ref 11.5–15.5)
WBC Count: 6.1 10*3/uL (ref 4.0–10.5)
nRBC: 0 % (ref 0.0–0.2)

## 2020-04-20 LAB — SAVE SMEAR(SSMR), FOR PROVIDER SLIDE REVIEW

## 2020-04-20 LAB — LACTATE DEHYDROGENASE: LDH: 193 U/L — ABNORMAL HIGH (ref 98–192)

## 2020-04-20 LAB — RETICULOCYTES
Immature Retic Fract: 12.9 % (ref 2.3–15.9)
RBC.: 4.48 MIL/uL (ref 3.87–5.11)
Retic Count, Absolute: 97.7 10*3/uL (ref 19.0–186.0)
Retic Ct Pct: 2.2 % (ref 0.4–3.1)

## 2020-04-20 LAB — ANTITHROMBIN III: AntiThromb III Func: 95 % (ref 75–120)

## 2020-04-20 NOTE — Progress Notes (Signed)
Hematology/Oncology Consultation   Name: SILAS SEDAM      MRN: [id number]    Location: Room/bed info not found  Date: 04/20/2020 Time:2:03 PM   REFERRING PHYSICIAN: Wyn Quaker, NP  REASON FOR CONSULT:  History of pulmonary embolus    DIAGNOSIS: History of pulmonary embolus   HISTORY OF PRESENT ILLNESS: Ms. Borman is a very pleasant 64 yo caucasian female with recent diagnosis of pulmonary embolus. No prior history of thrombus.  She had developed SOB, dry cough, pain between her shoulder blades and fatigue that worsened over the course of a week in September.  CT angio showed a small pulmonary embolus seen within a right upper lobe pulmonary artery with no evidence of right heart strain. She had no s/s of DVT and no [place] was done.  He father also had history of pulmonary emboli.  She is doing well on Eliquis 5 mg PO BID and has had no issues with bleeding. No abnormal bruising, no petechiae.  She still has some mild SOB with over exertion and occasional right lung pain.  She has history of paralyzed right hemidiaphragm.  She has 2 sons and no history of miscarriage.  She has had 2 back surgeries, 3 shoulder surgeries and both knees replaced without any complications.  No personal history of cancer.  Family history includes: mother and father - lung, son - non hodgkin's lymphoma, maternal aunts both with pancreatic, maternal aunt colon, maternal aunt - ovarian, maternal uncle - stomach and maternal uncle - throat.  No history of diabetes or thyroid disease.  No fever, chills, n/v, cough, rash, dizziness, chest pain, palpitations, abdominal pain or changes in bowel or bladder habits.  No swelling, tenderness, numbness or tingling in her extremities.  No falls or syncope.  No smoking. She has the occasional alcoholic beverage socially.  She has maintained a good appetite and is staying well hydrated. Her weight is stable.  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 She now  keeps her sweet granddaughter and great niece.  Both she and her husband were quite active exercising prior to the pandemic but they plan to get back into a routine.  ROS: All other 10 point review of systems is negative.   PAST MEDICAL HISTORY:   Past Medical History:  Diagnosis Date  . Anxiety   . Asthma 02/21/2018  . Back pain   . Depression   . GERD (gastroesophageal reflux disease)   . Hypertension     ALLERGIES: No Known Allergies    MEDICATIONS:  Current Outpatient Medications on File Prior to Visit  Medication Sig Dispense Refill  . albuterol (PROVENTIL HFA;VENTOLIN HFA) 108 (90 Base) MCG/ACT inhaler Inhale 2 puffs into the lungs every 6 (six) hours as needed for wheezing or shortness of breath. 1 Inhaler 1  . apixaban (ELIQUIS) 5 MG TABS tablet Take 2 tablets (10mg ) twice daily for 7 days, then 1 tablet (5mg ) twice daily 60 tablet 1  . buPROPion (WELLBUTRIN XL) 300 MG 24 hr tablet Take by mouth daily.     . CASCARA SAGRADA PO Take by mouth.    . celecoxib (CELEBREX) 100 MG capsule Take 1-2 capsules (100-200 mg total) by mouth 2 (two) times daily. 180 capsule 1  . estradiol (ESTRACE) 1 MG tablet Take 1 mg by mouth daily.    Marland Kitchen loratadine (CLARITIN) 10 MG tablet Take 1 tablet (10 mg total) by mouth daily. 90 tablet 3  . omeprazole (PRILOSEC) 40 MG capsule Take 1  capsule (40 mg total) by mouth in the morning and at bedtime. 180 capsule 1  . pregabalin (LYRICA) 50 MG capsule Take 50 mg by mouth 3 (three) times daily.     . vitamin E 1000 UNIT capsule Take by mouth.     Current Facility-Administered Medications on File Prior to Visit  Medication Dose Route Frequency Provider Last Rate Last Admin  . 0.9 %  sodium chloride infusion  500 mL Intravenous Continuous Thornton [place], MD         PAST SURGICAL HISTORY Past Surgical History:  Procedure Laterality Date  . ABDOMINAL HYSTERECTOMY    . BACK SURGERY    . SHOULDER ARTHROSCOPY W/ ROTATOR CUFF REPAIR    . SHOULDER OPEN  ROTATOR CUFF REPAIR    . TUBAL LIGATION      FAMILY HISTORY: Family History  Problem Relation Age of Onset  . Cancer Mother        lung  . Cancer Father        lung  . Hypertension Father   . Diabetes Father   . Non-Hodgkin's lymphoma Son   . Colon cancer Maternal Aunt   . Esophageal cancer Maternal Uncle   . Rectal cancer Neg Hx   . Stomach cancer Neg Hx     SOCIAL HISTORY:  reports that she is a non-smoker but has been exposed to tobacco smoke. She has never used smokeless tobacco. She reports current alcohol use. She reports that she does not use drugs.  PERFORMANCE STATUS: The patient's performance status is 1 - Symptomatic but completely ambulatory  PHYSICAL EXAM: Most Recent Vital Signs: There were no vitals taken for this visit. There were no vitals taken for this visit.  General Appearance:    Alert, cooperative, no distress, appears stated age  Head:    Normocephalic, without obvious abnormality, atraumatic  Eyes:    PERRL, conjunctiva/corneas clear, EOM's intact, fundi    benign, both eyes        Throat:   Lips, mucosa, and tongue normal; teeth and gums normal  Neck:   Supple, symmetrical, trachea midline, no adenopathy;    thyroid:  no enlargement/tenderness/nodules; no carotid   bruit or JVD  Back:     Symmetric, no curvature, ROM normal, no CVA tenderness  Lungs:     Clear to auscultation bilaterally, respirations unlabored  Chest Wall:    No tenderness or deformity   Heart:    Regular rate and rhythm, S1 and S2 normal, no murmur, rub   or gallop     Abdomen:     Soft, non-tender, bowel sounds active all four quadrants,    no masses, no organomegaly        Extremities:   Extremities normal, atraumatic, no cyanosis or edema  Pulses:   2+ and symmetric all extremities  Skin:   Skin color, texture, turgor normal, no rashes or lesions  Lymph nodes:   Cervical, supraclavicular, and axillary nodes normal  Neurologic:   CNII-XII intact, normal strength,  sensation and reflexes    throughout    LABORATORY DATA:  Results for orders placed or performed in visit on 04/20/20 (from the past 48 hour(s))  CBC with Differential (Cancer Center Only)     Status: None   Collection Time: 04/20/20  1:26 PM  Result Value Ref Range   WBC Count 6.1 4.0 - 10.5 K/uL   RBC 4.45 3.87 - 5.11 MIL/uL   Hemoglobin 14.2 12.0 - 15.0 g/dL   HCT 43.8  36 - 46 %   MCV 98.4 80.0 - 100.0 fL   MCH 31.9 26.0 - 34.0 pg   MCHC 32.4 30.0 - 36.0 g/dL   RDW 14.6 11.5 - 15.5 %   Platelet Count 253 150 - 400 K/uL   nRBC 0.0 0.0 - 0.2 %   Neutrophils Relative % 59 %   Neutro Abs 3.5 1.7 - 7.7 K/uL   Lymphocytes Relative 33 %   Lymphs Abs 2.0 0.7 - 4.0 K/uL   Monocytes Relative 6 %   Monocytes Absolute 0.4 0.1 - 1.0 K/uL   Eosinophils Relative 1 %   Eosinophils Absolute 0.1 0.0 - 0.5 K/uL   Basophils Relative 0 %   Basophils Absolute 0.0 0.0 - 0.1 K/uL   Immature Granulocytes 1 %   Abs Immature Granulocytes 0.06 0.00 - 0.07 K/uL    Comment: Performed at [facility] at [facility], [address], [place], [place] [address]  Save Smear [facility])     Status: None   Collection Time: 04/20/20  1:26 PM  Result Value Ref Range   Smear Review SMEAR STAINED AND AVAILABLE FOR REVIEW     Comment: Performed at [facility] at [facility], [address], [place], [place] [address]  Reticulocytes     Status: None   Collection Time: 04/20/20  1:27 PM  Result Value Ref Range   Retic Ct Pct 2.2 0.4 - 3.1 %   RBC. 4.48 3.87 - 5.11 MIL/uL   Retic Count, Absolute 97.7 19.0 - 186.0 K/uL   Immature Retic Fract 12.9 2.3 - 15.9 %    Comment: Performed at [facility] at [facility], [address] [place], [place] [address]      RADIOGRAPHY: No results found.     PATHOLOGY: None   ASSESSMENT/PLAN: Ms. Fellows is a very pleasant 64 yo caucasian female with recent diagnosis of small right upper lobe  pulmonary embolus. No prior history. She is doing well on Eliquis and will continue her same regimen.  We will see what her hypercoag panel reveals and determine if any changes will need to be made to her treatment.   Follow-up and repeat CT angio in 6 weeks.   All questions were answered and she is in agreement with the plan. She can contact our office with any questions or concerns. We can certainly see her sooner if needed.   Laverna Peace, NP

## 2020-04-21 LAB — IRON AND TIBC
Iron: 88 ug/dL (ref 41–142)
Saturation Ratios: 39 % (ref 21–57)
TIBC: 223 ug/dL — ABNORMAL LOW (ref 236–444)
UIBC: 135 ug/dL (ref 120–384)

## 2020-04-21 LAB — BETA-2-GLYCOPROTEIN I ABS, IGG/M/A
Beta-2 Glyco I IgG: <9 GPI IgG units (ref 0–20)
Beta-2-Glycoprotein I IgA: <9 GPI IgA units (ref 0–25)
Beta-2-Glycoprotein I IgM: <9 GPI IgM units (ref 0–32)

## 2020-04-21 LAB — PROTEIN S ACTIVITY: Protein S Activity: 124 % (ref 63–140)

## 2020-04-21 LAB — HOMOCYSTEINE: Homocysteine: 6.9 umol/L (ref 0.0–17.2)

## 2020-04-21 LAB — PROTEIN C ACTIVITY: Protein C Activity: 125 % (ref 73–180)

## 2020-04-21 LAB — PROTEIN S, TOTAL: Protein S Ag, Total: 90 % (ref 60–150)

## 2020-04-21 LAB — FERRITIN: Ferritin: 439 ng/mL — ABNORMAL HIGH (ref 11–307)

## 2020-04-22 LAB — LUPUS ANTICOAGULANT PANEL
DRVVT: 54.1 s — ABNORMAL HIGH (ref 0.0–47.0)
PTT Lupus Anticoagulant: 35.5 s (ref 0.0–51.9)

## 2020-04-22 LAB — PROTEIN C, TOTAL: Protein C, Total: 95 % (ref 60–150)

## 2020-04-22 LAB — CARDIOLIPIN ANTIBODIES, IGG, IGM, IGA
Anticardiolipin IgA: <9 APL U/mL (ref 0–11)
Anticardiolipin IgG: <9 GPL U/mL (ref 0–14)
Anticardiolipin IgM: 13 MPL U/mL — ABNORMAL HIGH (ref 0–12)

## 2020-04-22 LAB — DRVVT MIX: dRVVT Mix: 42.6 s — ABNORMAL HIGH (ref 0.0–40.4)

## 2020-04-22 LAB — DRVVT CONFIRM: dRVVT Confirm: 1 ratio (ref 0.8–1.2)

## 2020-04-23 LAB — PROTHROMBIN GENE MUTATION

## 2020-04-23 LAB — FACTOR 5 LEIDEN

## 2020-04-26 ENCOUNTER — Other Ambulatory Visit: Payer: Self-pay

## 2020-04-26 ENCOUNTER — Ambulatory Visit: Payer: PPO

## 2020-04-26 DIAGNOSIS — G4733 Obstructive sleep apnea (adult) (pediatric): Secondary | ICD-10-CM | POA: Diagnosis not present

## 2020-04-26 DIAGNOSIS — Z9189 Other specified personal risk factors, not elsewhere classified: Secondary | ICD-10-CM

## 2020-04-28 DIAGNOSIS — G4733 Obstructive sleep apnea (adult) (pediatric): Secondary | ICD-10-CM | POA: Diagnosis not present

## 2020-04-29 NOTE — Telephone Encounter (Signed)
04/29/2020  Yes I just received these records.  The results are listed below:  04/26/2020-home sleep study-AHI 5.6, SaO2 low 67%  This reveals mild obstructive sleep apnea.  Main issue is stopping breathing around 5.5 times an hour.  Patient should have office visit with a new pulmonary provider in a 30-minute time slot.  Patient is a former patient of Dr. Lake Bells.  Can review whether or not patient would like to proceed forward with treatment at that point in time.  Wyn Quaker, FNP

## 2020-04-29 NOTE — Telephone Encounter (Signed)
Patient is scheduled for a new patient appointment with Dr. Erin Fulling 06/01/20.

## 2020-04-29 NOTE — Telephone Encounter (Signed)
[name] please advise- patient is requesting sleep study results from 04/26/20.  Have you received these? Thanks!

## 2020-05-03 ENCOUNTER — Encounter: Payer: Self-pay | Admitting: *Deleted

## 2020-05-06 ENCOUNTER — Encounter: Payer: Self-pay | Admitting: Osteopathic Medicine

## 2020-05-07 MED ORDER — ESTRADIOL 1 MG PO TABS
1.0000 mg | ORAL_TABLET | Freq: Every day | ORAL | 3 refills | Status: DC
Start: 2020-05-07 — End: 2020-05-11

## 2020-05-07 MED ORDER — PREGABALIN 50 MG PO CAPS
50.0000 mg | ORAL_CAPSULE | Freq: Three times a day (TID) | ORAL | 3 refills | Status: DC
Start: 2020-05-07 — End: 2020-05-11

## 2020-05-07 MED ORDER — BUPROPION HCL ER (XL) 300 MG PO TB24: 300.0000 mg | ORAL_TABLET | Freq: Every day | ORAL | 3 refills | Status: DC

## 2020-05-11 MED ORDER — ESTRADIOL 1 MG PO TABS: 1.0000 mg | ORAL_TABLET | Freq: Every day | ORAL | 3 refills | Status: DC

## 2020-05-11 MED ORDER — PREGABALIN 50 MG PO CAPS: 50.0000 mg | ORAL_CAPSULE | Freq: Three times a day (TID) | ORAL | 3 refills | Status: DC

## 2020-05-11 NOTE — Addendum Note (Signed)
Addended by: Maryla Morrow on: 05/11/2020 03:53 PM   Modules accepted: Orders

## 2020-05-11 NOTE — Addendum Note (Signed)
Addended by: Narda Rutherford on: 05/11/2020 11:02 AM   Modules accepted: Orders

## 2020-05-11 NOTE — Telephone Encounter (Signed)
[name] please advise on following My Chart message:  Tried to get this filled but being on Medicare now it's over 500 dollars! You told me to let you know.  Is there something else you could give me? Thank you

## 2020-05-12 NOTE — Telephone Encounter (Signed)
05/12/20   She needs to contact her insurance formulary.  I am glad that she was able to let us know.  She needs to discuss this with health team advantage and see which other comparable medications are covered by her insurance.  It also could be the case that she may be in the actual donut hole.  Meaning that on 05/22/2020 they may have better pharmaceutical coverage for this medication.  There is no way for me to know which medication is appropriately covered by her pharmaceutical plan.  She needs to contact them directly and let us know.  Other comparable medications are: Xarelto, Coumadin, etc.  Wyn Quaker, FNP

## 2020-05-12 NOTE — Telephone Encounter (Signed)
Please advise on patient mychart message   Coumadin is only 5 bucks!

## 2020-05-12 NOTE — Telephone Encounter (Signed)
05/12/2020  Okay to refer patient to Coumadin clinic for them to meet with the patient as well as bridge to Coumadin.  Patient needs to be aware that this would require her to have weekly blood checks for her INR.  They will review this in more detail at the Coumadin clinic.  I still would recommend that she contact her insurance company to see if any other DOACs are more affordable.   Patient also needs to ensure that hematology is aware. I have [place] NP who saw the patient in November/2021.  To ensure that they are okay with patient potentially switching to Coumadin due to high cost of DOACs.  Wyn Quaker, FNP

## 2020-05-20 ENCOUNTER — Ambulatory Visit (HOSPITAL_BASED_OUTPATIENT_CLINIC_OR_DEPARTMENT_OTHER)
Admission: RE | Admit: 2020-05-20 | Discharge: 2020-05-20 | Disposition: A | Discharge status: Home / Self Care | Payer: PPO | Source: Ambulatory Visit | Attending: Family | Admitting: Family

## 2020-05-20 ENCOUNTER — Other Ambulatory Visit: Payer: PPO

## 2020-05-20 ENCOUNTER — Inpatient Hospital Stay: Payer: PPO

## 2020-05-20 ENCOUNTER — Inpatient Hospital Stay: Payer: PPO | Attending: Hematology & Oncology

## 2020-05-20 ENCOUNTER — Inpatient Hospital Stay (HOSPITAL_BASED_OUTPATIENT_CLINIC_OR_DEPARTMENT_OTHER): Payer: PPO | Admitting: Family

## 2020-05-20 ENCOUNTER — Encounter: Payer: Self-pay | Admitting: Family

## 2020-05-20 ENCOUNTER — Other Ambulatory Visit: Payer: Self-pay

## 2020-05-20 ENCOUNTER — Ambulatory Visit: Payer: PPO | Admitting: Family

## 2020-05-20 ENCOUNTER — Encounter (HOSPITAL_BASED_OUTPATIENT_CLINIC_OR_DEPARTMENT_OTHER): Payer: Self-pay

## 2020-05-20 ENCOUNTER — Telehealth: Payer: Self-pay

## 2020-05-20 VITALS — BP 131/75 | HR 87 | Temp 97.8°F | Resp 18 | Ht 64.0 in | Wt 223.0 lb

## 2020-05-20 DIAGNOSIS — Z7901 Long term (current) use of anticoagulants: Secondary | ICD-10-CM | POA: Diagnosis not present

## 2020-05-20 DIAGNOSIS — J9811 Atelectasis: Secondary | ICD-10-CM | POA: Diagnosis not present

## 2020-05-20 DIAGNOSIS — I2699 Other pulmonary embolism without acute cor pulmonale: Secondary | ICD-10-CM | POA: Diagnosis not present

## 2020-05-20 DIAGNOSIS — Z86711 Personal history of pulmonary embolism: Secondary | ICD-10-CM | POA: Insufficient documentation

## 2020-05-20 LAB — CMP (CANCER CENTER ONLY)
ALT: 23 U/L (ref 0–44)
AST: 34 U/L (ref 15–41)
Albumin: 3.8 g/dL (ref 3.5–5.0)
Alkaline Phosphatase: 62 U/L (ref 38–126)
Anion gap: 8 (ref 5–15)
BUN: 12 mg/dL (ref 8–23)
CO2: 28 mmol/L (ref 22–32)
Calcium: 9.2 mg/dL (ref 8.9–10.3)
Chloride: 102 mmol/L (ref 98–111)
Creatinine: 0.96 mg/dL (ref 0.44–1.00)
GFR, Estimated: >60 mL/min (ref >60)
Glucose, Bld: 89 mg/dL (ref 70–99)
Potassium: 4 mmol/L (ref 3.5–5.1)
Sodium: 138 mmol/L (ref 135–145)
Total Bilirubin: 0.6 mg/dL (ref 0.3–1.2)
Total Protein: 6.7 g/dL (ref 6.5–8.1)

## 2020-05-20 LAB — CBC WITH DIFFERENTIAL (CANCER CENTER ONLY)
Abs Immature Granulocytes: 0.02 10*3/uL (ref 0.00–0.07)
Basophils Absolute: 0 10*3/uL (ref 0.0–0.1)
Basophils Relative: 1 %
Eosinophils Absolute: 0.1 10*3/uL (ref 0.0–0.5)
Eosinophils Relative: 1 %
HCT: 44.7 % (ref 36.0–46.0)
Hemoglobin: 14.5 g/dL (ref 12.0–15.0)
Immature Granulocytes: 0 %
Lymphocytes Relative: 40 %
Lymphs Abs: 2.3 10*3/uL (ref 0.7–4.0)
MCH: 32.4 pg (ref 26.0–34.0)
MCHC: 32.4 g/dL (ref 30.0–36.0)
MCV: 99.8 fL (ref 80.0–100.0)
Monocytes Absolute: 0.4 10*3/uL (ref 0.1–1.0)
Monocytes Relative: 6 %
Neutro Abs: 3 10*3/uL (ref 1.7–7.7)
Neutrophils Relative %: 52 %
Platelet Count: 244 10*3/uL (ref 150–400)
RBC: 4.48 MIL/uL (ref 3.87–5.11)
RDW: 14.8 % (ref 11.5–15.5)
WBC Count: 5.8 10*3/uL (ref 4.0–10.5)
nRBC: 0 % (ref 0.0–0.2)

## 2020-05-20 MED ORDER — IOHEXOL 350 MG/ML SOLN
100.0000 mL | Freq: Once | INTRAVENOUS | Status: AC | PRN
  Administered 2020-05-20: 100 mL via INTRAVENOUS

## 2020-05-20 NOTE — Telephone Encounter (Signed)
S/w pt per 05/20/20 los and she is aware of her appts      aom

## 2020-05-20 NOTE — Progress Notes (Signed)
Hematology and Oncology Follow Up Visit  [name] [id number] [birth year] 64 y.o. 05/20/2020   Principle Diagnosis:  Pulmonary embolism  Past Work-up: Hyper coag panel negative  Current Therapy: Eliquis 5 mg PO BID    Interim History:  [name] is here today for follow-up. She had Karina repeat CT angio today which showed resolution of Karina right upper lobe thrombus.  She verbalized that she is taking Karina Eliquis as prescribed. However, the cost out of pocket is $500.00. I have messaged our financial counselor Delice Bison about financial assistance that may be available for Karina. We will try to find Karina some help! She has not noted any blood loss. No bruising or petechiae.  No fever, chills, n/v, cough, rash, dizziness, SOB, chest pain, palpitations, abdominal pain or changes in bowel or bladder habits.  No swelling, tenderness, numbness or tingling in Karina extremities. No falls or syncope.  She has maintained a good appetite and is staying well hydrated. Karina weight is stable.  Se has been walking regularly for exercise and also chasing [name] around.   ECOG Performance Status: 0 - Asymptomatic  Medications:  Allergies as of 05/20/2020   No Known Allergies     Medication List       Accurate as of May 20, 2020  2:32 PM. If you have any questions, ask your nurse or doctor.        albuterol 108 (90 Base) MCG/ACT inhaler Commonly known as: VENTOLIN HFA Inhale 2 puffs into the lungs every 6 (six) hours as needed for wheezing or shortness of breath.   apixaban 5 MG Tabs tablet Commonly known as: Eliquis Take 2 tablets (10mg ) twice daily for 7 days, then 1 tablet (5mg ) twice daily   buPROPion 300 MG 24 hr tablet Commonly known as: WELLBUTRIN XL Take 1 tablet (300 mg total) by mouth daily.   CASCARA SAGRADA PO Take by mouth.   celecoxib 100 MG capsule Commonly known as: CELEBREX Take 1-2 capsules (100-200 mg total) by mouth 2 (two) times daily.   estradiol 1  MG tablet Commonly known as: ESTRACE Take 1 tablet (1 mg total) by mouth daily.   loratadine 10 MG tablet Commonly known as: CLARITIN Take 1 tablet (10 mg total) by mouth daily.   omeprazole 40 MG capsule Commonly known as: PRILOSEC Take 1 capsule (40 mg total) by mouth in the morning and at bedtime.   pregabalin 50 MG capsule Commonly known as: Lyrica Take 1 capsule (50 mg total) by mouth 3 (three) times daily.   vitamin E 1000 UNIT capsule Take by mouth.       Allergies: No Known Allergies  Past Medical History, Surgical history, Social history, and Family History were reviewed and updated.  Review of Systems: All other 10 point review of systems is negative.   Physical Exam:  vitals were not taken for this visit.   Wt Readings from Last 3 Encounters:  03/26/20 225 lb (102.1 kg)  03/18/20 225 lb 1.9 oz (102.1 kg)  02/20/20 229 lb (103.9 kg)    Ocular: Sclerae unicteric, pupils equal, round and reactive to light Ear-nose-throat: Oropharynx clear, dentition fair Lymphatic: No cervical or supraclavicular adenopathy Lungs no rales or rhonchi, good excursion bilaterally Heart regular rate and rhythm, no murmur appreciated Abd soft, nontender, positive bowel sounds MSK no focal spinal tenderness, no joint edema Neuro: non-focal, well-oriented, appropriate affect Breasts: Deferred   Lab Results  Component Value Date   WBC 5.8 05/20/2020   HGB  14.5 05/20/2020   HCT 44.7 05/20/2020   MCV 99.8 05/20/2020   PLT 244 05/20/2020   Lab Results  Component Value Date   FERRITIN 439 (H) 04/20/2020   IRON 88 04/20/2020   TIBC 223 (L) 04/20/2020   UIBC 135 04/20/2020   IRONPCTSAT 39 04/20/2020   Lab Results  Component Value Date   RETICCTPCT 2.2 04/20/2020   RBC 4.48 05/20/2020   No results found for: KPAFRELGTCHN, LAMBDASER, KAPLAMBRATIO No results found for: IGGSERUM, IGA, IGMSERUM No results found for: Odetta Pink, SPEI   Chemistry      Component Value Date/Time   NA 138 05/20/2020 1157   K 4.0 05/20/2020 1157   CL 102 05/20/2020 1157   CO2 28 05/20/2020 1157   BUN 12 05/20/2020 1157   CREATININE 0.96 05/20/2020 1157   CREATININE 0.78 02/09/2020 1415      Component Value Date/Time   CALCIUM 9.2 05/20/2020 1157   ALKPHOS 62 05/20/2020 1157   AST 34 05/20/2020 1157   ALT 23 05/20/2020 1157   BILITOT 0.6 05/20/2020 1157       Impression and Plan: [name] is a very pleasant 64 yo caucasian female with recent diagnosis of small right upper lobe pulmonary embolus. No prior history. CT angio today showed complete resolution of the right upper lung lobe thrombus.  She will complete 6 months of full dose anticoagulation and then 1 year of maintenance dose.  Follow-up in 3 months and we will reduce Karina Eliquis to maintenance dose at that time.   She was encouraged to contact our office with any questions or concerns.  Karina Peace, NP 12/30/20212:33 PM

## 2020-06-01 ENCOUNTER — Ambulatory Visit: Payer: PPO | Admitting: Pulmonary Disease

## 2020-06-01 ENCOUNTER — Other Ambulatory Visit: Payer: Self-pay

## 2020-06-01 ENCOUNTER — Encounter: Payer: Self-pay | Admitting: Pulmonary Disease

## 2020-06-01 VITALS — BP 140/86 | HR 90 | Temp 97.3°F | Ht 64.0 in | Wt 222.0 lb

## 2020-06-01 DIAGNOSIS — J986 Disorders of diaphragm: Secondary | ICD-10-CM

## 2020-06-01 DIAGNOSIS — J4520 Mild intermittent asthma, uncomplicated: Secondary | ICD-10-CM | POA: Diagnosis not present

## 2020-06-01 DIAGNOSIS — Z86711 Personal history of pulmonary embolism: Secondary | ICD-10-CM | POA: Diagnosis not present

## 2020-06-01 NOTE — Progress Notes (Signed)
Subjective:   PATIENT ID: [name] GENDER: female DOB: 06-18-1955, MRN: [id number]   HPI  Chief Complaint  Patient presents with  . Establish Care    Former BQ patient for asthma. States her breathing has been stable since her last appt. Has an occasional cough. Has a history of a PE, can't afford medication. Has been off of the medication for 2 weeks.    [name] is a 65 year old woman, never smoker with asthma, paraylyzed right hemidiaphragm and pulmonary emboli 01/2020 who returns to pulmonary clinic for follow up.   She was last seen 03/26/20 by Wyn Quaker, NP. She is using albuterol inhaler as needed for her asthma and has not had much need for it of recent. She is taking mucinex cough drops at night which helps alleviate her cough. She denies any shortness of breath, chest tightness or wheezing.   She is working on weight loss and has lost 18lbs. She had a sleep study last month which showed mild OSA with an AHI of 5.6/hr.   She was on eliquis for her pulmonary emboli but has not been on this medication for 2 weeks due to cost. She is being followed by hematology and they are managing her anticoagulation. A request for medication assistance has been placed by them.  Past Medical History:  Diagnosis Date  . Anxiety   . Asthma 02/21/2018  . Back pain   . Depression   . GERD (gastroesophageal reflux disease)   . Hypertension      Family History  Problem Relation Age of Onset  . Cancer Mother        lung  . Cancer Father        lung  . Hypertension Father   . Diabetes Father   . Non-Hodgkin's lymphoma Son   . Colon cancer Maternal Aunt   . Esophageal cancer Maternal Uncle   . Rectal cancer Neg Hx   . Stomach cancer Neg Hx      Social History   Socioeconomic History  . Marital status: Married    Spouse name: Not on file  . Number of children: 2  . Years of education: Not on file  . Highest education level: Not on file  Occupational History  .  Occupation: retired  Tobacco Use  . Smoking status: Passive Smoke Exposure - Never Smoker  . Smokeless tobacco: Never Used  Vaping Use  . Vaping Use: Never used  Substance and Sexual Activity  . Alcohol use: Yes    Comment: occasionally  . Drug use: Never  . Sexual activity: Yes    Birth control/protection: Post-menopausal, Surgical    Comment: hysterectomy  Other Topics Concern  . Not on file  Social History Narrative  . Not on file   Social Determinants of Health   Financial Resource Strain: Not on file  Food Insecurity: Not on file  Transportation Needs: Not on file  Physical Activity: Not on file  Stress: Not on file  Social Connections: Not on file  Intimate Partner Violence: Not on file     No Known Allergies   Outpatient Medications Prior to Visit  Medication Sig Dispense Refill  . albuterol (PROVENTIL HFA;VENTOLIN HFA) 108 (90 Base) MCG/ACT inhaler Inhale 2 puffs into the lungs every 6 (six) hours as needed for wheezing or shortness of breath. 1 Inhaler 1  . apixaban (ELIQUIS) 5 MG TABS tablet Take 2 tablets (10mg ) twice daily for 7 days, then 1 tablet (5mg )  twice daily 60 tablet 1  . buPROPion (WELLBUTRIN XL) 300 MG 24 hr tablet Take 1 tablet (300 mg total) by mouth daily. 90 tablet 3  . CASCARA SAGRADA PO Take by mouth.    . celecoxib (CELEBREX) 100 MG capsule Take 1-2 capsules (100-200 mg total) by mouth 2 (two) times daily. 180 capsule 1  . DULoxetine (CYMBALTA) 60 MG capsule Take 60 mg by mouth daily.    Marland Kitchen estradiol (ESTRACE) 1 MG tablet Take 1 tablet (1 mg total) by mouth daily. 90 tablet 3  . loratadine (CLARITIN) 10 MG tablet Take 1 tablet (10 mg total) by mouth daily. 90 tablet 3  . omeprazole (PRILOSEC) 40 MG capsule Take 1 capsule (40 mg total) by mouth in the morning and at bedtime. 180 capsule 1  . pregabalin (LYRICA) 50 MG capsule Take 1 capsule (50 mg total) by mouth 3 (three) times daily. 270 capsule 3  . vitamin E 1000 UNIT capsule Take by mouth.      No facility-administered medications prior to visit.    Review of Systems  Constitutional: Positive for weight loss (intentional). Negative for chills, fever and malaise/fatigue.  HENT: Negative for congestion, sinus pain and sore throat.   Eyes: Negative.   Respiratory: Negative for cough, hemoptysis, sputum production, shortness of breath and wheezing.   Cardiovascular: Negative for chest pain, palpitations, orthopnea, claudication, leg swelling and PND.  Gastrointestinal: Negative for abdominal pain, heartburn, nausea and vomiting.  Genitourinary: Negative.   Musculoskeletal: Negative.   Skin: Negative for rash.  Neurological: Negative for dizziness, weakness and headaches.  Endo/Heme/Allergies: Negative.   Psychiatric/Behavioral: Negative.     Objective:   Vitals:   06/01/20 1022  BP: 140/86  Pulse: 90  Temp: (!) 97.3 F (36.3 C)  TempSrc: Temporal  SpO2: 98%  Weight: 222 lb (100.7 kg)  Height: 5\' 4"  (1.626 m)     Physical Exam Constitutional:      General: She is not in acute distress.    Appearance: Normal appearance. She is obese. She is not ill-appearing.  HENT:     Head: Normocephalic and atraumatic.  Eyes:     General: No scleral icterus.    Conjunctiva/sclera: Conjunctivae normal.     Pupils: Pupils are equal, round, and reactive to light.  Cardiovascular:     Rate and Rhythm: Normal rate and regular rhythm.     Pulses: Normal pulses.     Heart sounds: Normal heart sounds. No murmur heard.   Pulmonary:     Effort: Pulmonary effort is normal.     Breath sounds: Normal breath sounds. No wheezing, rhonchi or rales.  Abdominal:     General: Bowel sounds are normal.     Palpations: Abdomen is soft.  Musculoskeletal:     Right lower leg: No edema.     Left lower leg: No edema.  Skin:    General: Skin is warm and dry.  Neurological:     Mental Status: She is alert.  Psychiatric:        Mood and Affect: Mood normal.        Behavior: Behavior  normal.        Thought Content: Thought content normal.        Judgment: Judgment normal.     CBC    Component Value Date/Time   WBC 5.8 05/20/2020 1157   WBC CANCELED 02/09/2020 1415   RBC 4.48 05/20/2020 1157   HGB 14.5 05/20/2020 1157   HCT 44.7 05/20/2020 1157  PLT 244 05/20/2020 1157   MCV 99.8 05/20/2020 1157   MCH 32.4 05/20/2020 1157   MCHC 32.4 05/20/2020 1157   RDW 14.8 05/20/2020 1157   LYMPHSABS 2.3 05/20/2020 1157   MONOABS 0.4 05/20/2020 1157   EOSABS 0.1 05/20/2020 1157   BASOSABS 0.0 05/20/2020 1157   BMP Latest Ref Rng & Units 05/20/2020 04/20/2020 02/09/2020  Glucose 70 - 99 mg/dL 89 83 80  BUN 8 - 23 mg/dL 12 10 10   Creatinine 0.44 - 1.00 mg/dL 0.96 0.86 0.78  BUN/Creat Ratio 6 - 22 (calc) - - NOT APPLICABLE  Sodium 875 - 145 mmol/L 138 138 136  Potassium 3.5 - 5.1 mmol/L 4.0 3.4(L) 4.2  Chloride 98 - 111 mmol/L 102 102 103  CO2 22 - 32 mmol/L 28 25 23   Calcium 8.9 - 10.3 mg/dL 9.2 9.0 8.4(L)   Chest imaging: CT Chest 05/20/20 1. Interval resolution of previously seen right upper lobe pulmonary emboli. 2. No new pulmonary embolus. 3. Mild dependent atelectasis is present both lungs. 4. Chronic elevation of the right hemidiaphragm.  PFT: PFT Results Latest Ref Rng & Units 02/13/2018  FVC-Pre L 1.89  FVC-Predicted Pre % 60  FVC-Post L 2.03  FVC-Predicted Post % 65  Pre FEV1/FVC % % 82  Post FEV1/FCV % % 87  FEV1-Pre L 1.56  FEV1-Predicted Pre % 65  FEV1-Post L 1.76  DLCO uncorrected ml/min/mmHg 20.39  DLCO UNC% % 88  DLCO corrected ml/min/mmHg 19.65  DLCO COR %Predicted % 85  DLVA Predicted % 133  TLC L 3.54  TLC % Predicted % 72  RV % Predicted % 75   Sleep Study 04/26/20: Mild OSA AHI 5.6  Assessment & Plan:   Mild intermittent asthma without complication  History of pulmonary embolus (PE)  Elevated hemidiaphragm  Discussion: [name] is a 65 year old woman, never smoker with asthma, paraylyzed right hemidiaphragm and  pulmonary emboli 01/2020 who returns to pulmonary clinic for follow up.   Her asthma appears to be well controlled at this time. She can continue on as needed albuterol. She has previously required symbicort.   For her pulmonary emboli, we will place a referral to [facility] medication assistance program to see if they can assist with eliquis. Heme/onc is managing her anticoagulation and duration of therapy.    Follow up in 6 months  Freda Jackson, MD [facility] Office: [phone]   See Amion for Pager Details    Current Outpatient Medications:  .  albuterol (PROVENTIL HFA;VENTOLIN HFA) 108 (90 Base) MCG/ACT inhaler, Inhale 2 puffs into the lungs every 6 (six) hours as needed for wheezing or shortness of breath., Disp: 1 Inhaler, Rfl: 1 .  apixaban (ELIQUIS) 5 MG TABS tablet, Take 2 tablets (10mg ) twice daily for 7 days, then 1 tablet (5mg ) twice daily, Disp: 60 tablet, Rfl: 1 .  buPROPion (WELLBUTRIN XL) 300 MG 24 hr tablet, Take 1 tablet (300 mg total) by mouth daily., Disp: 90 tablet, Rfl: 3 .  CASCARA SAGRADA PO, Take by mouth., Disp: , Rfl:  .  celecoxib (CELEBREX) 100 MG capsule, Take 1-2 capsules (100-200 mg total) by mouth 2 (two) times daily., Disp: 180 capsule, Rfl: 1 .  DULoxetine (CYMBALTA) 60 MG capsule, Take 60 mg by mouth daily., Disp: , Rfl:  .  estradiol (ESTRACE) 1 MG tablet, Take 1 tablet (1 mg total) by mouth daily., Disp: 90 tablet, Rfl: 3 .  loratadine (CLARITIN) 10 MG tablet, Take 1 tablet (10 mg  total) by mouth daily., Disp: 90 tablet, Rfl: 3 .  omeprazole (PRILOSEC) 40 MG capsule, Take 1 capsule (40 mg total) by mouth in the morning and at bedtime., Disp: 180 capsule, Rfl: 1 .  pregabalin (LYRICA) 50 MG capsule, Take 1 capsule (50 mg total) by mouth 3 (three) times daily., Disp: 270 capsule, Rfl: 3 .  vitamin E 1000 UNIT capsule, Take by mouth., Disp: , Rfl:

## 2020-06-01 NOTE — Patient Instructions (Signed)
Will place referral to [facility] to try for medical assistance with the eliquis. If not you will need to coordinate with the hematology office about other blood thinner options.   Continue as needed albuterol for cough, shortness of breath, wheezing or chest tightness.

## 2020-06-10 ENCOUNTER — Telehealth: Payer: Self-pay | Admitting: Pulmonary Disease

## 2020-06-10 MED ORDER — ALBUTEROL SULFATE HFA 108 (90 BASE) MCG/ACT IN AERS: 2.0000 | INHALATION_SPRAY | Freq: Four times a day (QID) | RESPIRATORY_TRACT | 1 refills | Status: DC | PRN

## 2020-06-10 MED ORDER — APIXABAN 5 MG PO TABS: 5.0000 mg | ORAL_TABLET | Freq: Two times a day (BID) | ORAL | 6 refills | Status: DC

## 2020-06-10 NOTE — Telephone Encounter (Signed)
Prescription sent to [facility] for eliquis 5mg  BID.   Heme/onc has been managing her anticoagulation but I sent the script.   Thanks, Wille Glaser

## 2020-06-10 NOTE — Telephone Encounter (Signed)
Patient states that she was able to get Eliquis covered through [facility], she needs a new prescription sent in for twice a day dosing. Not sure what dose you wanted her on if you can send prescription in to [facility] on file.   I refilled her albuterol hfa. Thanks.

## 2020-07-30 ENCOUNTER — Encounter: Payer: Self-pay | Admitting: Osteopathic Medicine

## 2020-07-30 ENCOUNTER — Other Ambulatory Visit: Payer: Self-pay

## 2020-07-30 ENCOUNTER — Ambulatory Visit (INDEPENDENT_AMBULATORY_CARE_PROVIDER_SITE_OTHER): Payer: PPO

## 2020-07-30 ENCOUNTER — Ambulatory Visit (INDEPENDENT_AMBULATORY_CARE_PROVIDER_SITE_OTHER): Payer: PPO | Admitting: Osteopathic Medicine

## 2020-07-30 VITALS — BP 135/82 | HR 87 | Temp 98.2°F | Wt 220.1 lb

## 2020-07-30 DIAGNOSIS — R222 Localized swelling, mass and lump, trunk: Secondary | ICD-10-CM | POA: Diagnosis not present

## 2020-07-30 DIAGNOSIS — M79672 Pain in left foot: Secondary | ICD-10-CM | POA: Diagnosis not present

## 2020-07-30 DIAGNOSIS — R109 Unspecified abdominal pain: Secondary | ICD-10-CM

## 2020-07-30 DIAGNOSIS — M19072 Primary osteoarthritis, left ankle and foot: Secondary | ICD-10-CM | POA: Diagnosis not present

## 2020-07-30 DIAGNOSIS — R140 Abdominal distension (gaseous): Secondary | ICD-10-CM | POA: Diagnosis not present

## 2020-07-30 MED ORDER — DICYCLOMINE HCL 20 MG PO TABS: 20.0000 mg | ORAL_TABLET | Freq: Four times a day (QID) | ORAL | 0 refills | Status: DC | PRN

## 2020-07-30 NOTE — Progress Notes (Signed)
[name] is a 65 y.o. female who presents to  [place] at [facility]  today, 07/30/20, seeking care for the following:  . Abdominal cramping / bloating - continuing after bout of gastroenteritis few weeks ago, diarrhea has resolved, no nausea, no GERD, no obvious triggers. Feels like cramping / stretching pain usually around 10-11 AM . Lump on torso - concerned it's on rib, no pain, no skin changes, no injury.  . Foot pain L - random sharp pain on dorsum foot, no skin changes, no injury. Has been ongoing a few months      ASSESSMENT & PLAN with other pertinent findings:  The primary encounter diagnosis was Abdominal cramping. Diagnoses of Abdominal bloating, Subcutaneous mass of abdominal wall, and Left foot pain were also pertinent to this visit.   GI upset: Suspect GI sensitivity post-viral infection, possible IBS. Rx as below, low FODMAP info given to see if anything on that list is a trigger  Subq mass: Exam favors benign lipoma   Foot pain: Xray today, suspect arthritis, no injury, I don't feel immobilization is warranted given intermittent nature of this pain, referral to podiatry to assess need for injection, other therapy/evaluation, orthotics    Patient Instructions   1. Abdominal cramping 2. Abdominal bloating Rx sent to help spasms See list of foods below to avoid  If worse / no change in next few weeks let me knoa and will sent to GI  3. Subcutaneous mass of abdominal wall Ultrasound ordered to evaluate but I suspect benign lipoma   4. Left foot pain Xray today Referral to podiatry       2017 UpToDate Characteristics and sources of common FODMAPs  Word that corresponds to letter in acronym Compounds in this category Foods that contain these compounds  F Fermentable  O Oligosaccharides Fructans, galacto-oligosaccharides Wheat, barley, rye, onion, leek, white part of spring onion, garlic, shallots,  artichokes, beetroot, fennel, peas, chicory, pistachio, cashews, legumes, lentils, and chickpeas    D Disaccharides Lactose Milk, custard, ice cream, and yogurt    M  Monosaccharides "Free fructose" (fructose in excess of glucose) Apples, pears, mangoes, cherries, watermelon, asparagus, sugar snap peas, honey, high-fructose corn syrup    A And  P Polyols Sorbitol, mannitol, maltitol, and xylitol Apples, pears, apricots, cherries, nectarines, peaches, plums, watermelon, mushrooms, cauliflower, artificially sweetened chewing gum and confectionery    FODMAPs: fermentable oligosaccharides, disaccharides, monosaccharides, and polyols. Adapted by permission from [employer]: [employer]. Agustin Cree, [employer]omer [employer], [place]. Short-chain carbohydrates and functional gastrointestinal disorders. Am J Gastroenterol 2013; 108:707. Copyright  2013. www.nature.com/ajg. Graphic [phone] Version 2.0       Orders Placed This Encounter  Procedures  . DG Foot Complete Left  . [place] CHEST SOFT TISSUE  . Ambulatory referral to [place] ordered this encounter  Medications  . dicyclomine (BENTYL) 20 MG tablet    Sig: Take 1 tablet (20 mg total) by mouth every 6 (six) hours as needed for spasms.    Dispense:  90 tablet    Refill:  0     See below for relevant physical exam findings  See below for recent lab and imaging results reviewed  Medications, allergies, PMH, PSH, SocH, FamH reviewed below    Follow-up instructions: Return if symptoms worsen or fail to improve.  Exam:  BP 135/82 (BP Location: Left Arm, Patient Position: Sitting, Cuff Size: Large)   Pulse 87   Temp 98.2 F (36.8 C) (Oral)   Wt 220 lb 1.9 oz (99.8 kg)   BMI 37.78 kg/m   Constitutional: VS see above. General Appearance: alert, well-developed, well-nourished, NAD  Neck: No masses, trachea midline.    Respiratory: Normal respiratory effort.   Musculoskeletal: Gait normal. Symmetric and independent movement of all extremities. Subq mobile mass maybe size of grape on L lower torso, nontender faovr lipoma vs cyst   Abdominal: non-tender, non-distended, no appreciable organomegaly, neg Murphy's, BS WNLx4  Neurological: Normal balance/coordination. No tremor.  Skin: warm, dry, intact.   Psychiatric: Normal judgment/insight. Normal mood and affect. Oriented x3.   Current Meds  Medication Sig  . albuterol (VENTOLIN HFA) 108 (90 Base) MCG/ACT inhaler Inhale 2 puffs into the lungs every 6 (six) hours as needed for wheezing or shortness of breath.  Marland Kitchen apixaban (ELIQUIS) 5 MG TABS tablet Take 1 tablet (5 mg total) by mouth 2 (two) times daily.  Marland Kitchen buPROPion (WELLBUTRIN XL) 300 MG 24 hr tablet Take 1 tablet (300 mg total) by mouth daily.  . CASCARA SAGRADA PO Take by mouth.  . celecoxib (CELEBREX) 100 MG capsule Take 1-2 capsules (100-200 mg total) by mouth 2 (two) times daily.  Marland Kitchen dicyclomine (BENTYL) 20 MG tablet Take 1 tablet (20 mg total) by mouth every 6 (six) hours as needed for spasms.  . DULoxetine (CYMBALTA) 60 MG capsule Take 60 mg by mouth daily.  Marland Kitchen estradiol (ESTRACE) 1 MG tablet Take 1 tablet (1 mg total) by mouth daily.  Marland Kitchen loratadine (CLARITIN) 10 MG tablet Take 1 tablet (10 mg total) by mouth daily.  Marland Kitchen omeprazole (PRILOSEC) 40 MG capsule Take 1 capsule (40 mg total) by mouth in the morning and at bedtime.  . pregabalin (LYRICA) 50 MG capsule Take 1 capsule (50 mg total) by mouth 3 (three) times daily.  . vitamin E 1000 UNIT capsule Take by mouth.    No Known Allergies  Patient Active Problem List   Diagnosis Date Noted  . At risk for obstructive sleep apnea 03/26/2020  . History of pulmonary embolus (PE) 03/26/2020  . Dysphagia 10/30/2018  . History of esophageal dilatation 10/30/2018  . Asthma 02/21/2018  . Paralyzed hemidiaphragm 02/21/2018  . Elevated hemidiaphragm  02/05/2018  . Atelectasis 02/05/2018  . Anxiety 02/01/2018  . Cervical neck pain with evidence of disc disease 02/01/2018  . BMI 38.0-38.9,adult 02/01/2018  . Cataract 04/01/2015  . Depression 04/01/2015  . Hyperlipidemia 04/01/2015  . Hypertension 04/01/2015  . Disorder of intervertebral disc of cervical spine 04/01/2015  . Lumbar disc disease 04/01/2015  . Hormone replacement therapy (postmenopausal) 07/21/2014  . Fatty (change of) liver, not elsewhere classified 05/29/2012    Family History  Problem Relation Age of Onset  . Cancer Mother        lung  . Cancer Father        lung  . Hypertension Father   . Diabetes Father   . Non-Hodgkin's lymphoma Son   . Colon cancer Maternal Aunt   . Esophageal cancer Maternal Uncle   . Rectal cancer Neg Hx   . Stomach cancer Neg Hx     Social History   Tobacco Use  Smoking Status Passive Smoke Exposure - Never Smoker  Smokeless Tobacco Never Used    Past Surgical History:  Procedure Laterality Date  . ABDOMINAL HYSTERECTOMY    . BACK SURGERY    .  SHOULDER ARTHROSCOPY W/ ROTATOR CUFF REPAIR    . SHOULDER OPEN ROTATOR CUFF REPAIR    . TUBAL LIGATION      Immunization History  Administered Date(s) Administered  . Moderna Sars-Covid-2 Vaccination 08/21/2019, 09/16/2019  . Tdap 11/12/2019    Recent Results (from the past 2160 hour(s))  CBC with Differential ([place] Only)     Status: None   Collection Time: 05/20/20 11:57 AM  Result Value Ref Range   WBC Count 5.8 4.0 - 10.5 K/uL   RBC 4.48 3.87 - 5.11 MIL/uL   Hemoglobin 14.5 12.0 - 15.0 g/dL   HCT 44.7 36.0 - 46.0 %   MCV 99.8 80.0 - 100.0 fL   MCH 32.4 26.0 - 34.0 pg   MCHC 32.4 30.0 - 36.0 g/dL   RDW 14.8 11.5 - 15.5 %   Platelet Count 244 150 - 400 K/uL   nRBC 0.0 0.0 - 0.2 %   Neutrophils Relative % 52 %   Neutro Abs 3.0 1.7 - 7.7 K/uL   Lymphocytes Relative 40 %   Lymphs Abs 2.3 0.7 - 4.0 K/uL   Monocytes Relative 6 %   Monocytes Absolute 0.4 0.1 -  1.0 K/uL   Eosinophils Relative 1 %   Eosinophils Absolute 0.1 0.0 - 0.5 K/uL   Basophils Relative 1 %   Basophils Absolute 0.0 0.0 - 0.1 K/uL   Immature Granulocytes 0 %   Abs Immature Granulocytes 0.02 0.00 - 0.07 K/uL    Comment: Performed at [facility] [place], [address], [place], [place] [address]  CMP ([place] only)     Status: None   Collection Time: 05/20/20 11:57 AM  Result Value Ref Range   Sodium 138 135 - 145 mmol/L   Potassium 4.0 3.5 - 5.1 mmol/L   Chloride 102 98 - 111 mmol/L   CO2 28 22 - 32 mmol/L   Glucose, Bld 89 70 - 99 mg/dL    Comment: Glucose reference range applies only to samples taken after fasting for at least 8 hours.   BUN 12 8 - 23 mg/dL   Creatinine 0.96 0.44 - 1.00 mg/dL   Calcium 9.2 8.9 - 10.3 mg/dL   Total Protein 6.7 6.5 - 8.1 g/dL   Albumin 3.8 3.5 - 5.0 g/dL   AST 34 15 - 41 U/L   ALT 23 0 - 44 U/L   Alkaline Phosphatase 62 38 - 126 U/L   Total Bilirubin 0.6 0.3 - 1.2 mg/dL   GFR, Estimated >60 >60 mL/min    Comment: (NOTE) Calculated using the CKD-EPI Creatinine Equation (2021)    Anion gap 8 5 - 15    Comment: Performed at [facility] at [facility], [address], [place], [place] [address]    No results found.     All questions at time of visit were answered - patient instructed to contact office with any additional concerns or updates. ER/RTC precautions were reviewed with the patient as applicable.   Please note: manual typing as well as voice recognition software may have been used to produce this document - typos may escape review. Please contact Dr. Sheppard Coil for any needed clarifications.

## 2020-07-30 NOTE — Patient Instructions (Addendum)
1. Abdominal cramping 2. Abdominal bloating Rx sent to help spasms See list of foods below to avoid  If worse / no change in next few weeks let me knoa and will sent to GI  3. Subcutaneous mass of abdominal wall Ultrasound ordered to evaluate but I suspect benign lipoma   4. Left foot pain Xray today Referral to podiatry       2017 UpToDate Characteristics and sources of common FODMAPs  Word that corresponds to letter in acronym Compounds in this category Foods that contain these compounds  F Fermentable  O Oligosaccharides Fructans, galacto-oligosaccharides Wheat, barley, rye, onion, leek, white part of spring onion, garlic, shallots, artichokes, beetroot, fennel, peas, chicory, pistachio, cashews, legumes, lentils, and chickpeas    D Disaccharides Lactose Milk, custard, ice cream, and yogurt    M  Monosaccharides "Free fructose" (fructose in excess of glucose) Apples, pears, mangoes, cherries, watermelon, asparagus, sugar snap peas, honey, high-fructose corn syrup    A And  P Polyols Sorbitol, mannitol, maltitol, and xylitol Apples, pears, apricots, cherries, nectarines, peaches, plums, watermelon, mushrooms, cauliflower, artificially sweetened chewing gum and confectionery    FODMAPs: fermentable oligosaccharides, disaccharides, monosaccharides, and polyols. Adapted by permission from [employer]: [employer]. Agustin Cree, [employer], [place]. Short-chain carbohydrates and functional gastrointestinal disorders. Am J Gastroenterol 2013; 108:707. Copyright  2013. www.nature.com/ajg. Graphic [phone] Version 2.0

## 2020-08-02 ENCOUNTER — Ambulatory Visit (INDEPENDENT_AMBULATORY_CARE_PROVIDER_SITE_OTHER): Payer: PPO

## 2020-08-02 ENCOUNTER — Other Ambulatory Visit: Payer: Self-pay

## 2020-08-02 DIAGNOSIS — R222 Localized swelling, mass and lump, trunk: Secondary | ICD-10-CM

## 2020-08-02 DIAGNOSIS — R1902 Left upper quadrant abdominal swelling, mass and lump: Secondary | ICD-10-CM | POA: Diagnosis not present

## 2020-08-03 NOTE — Addendum Note (Signed)
Addended by: Maryla Morrow on: 08/03/2020 04:03 PM   Modules accepted: Orders

## 2020-08-05 ENCOUNTER — Other Ambulatory Visit: Payer: Self-pay

## 2020-08-05 ENCOUNTER — Ambulatory Visit: Payer: PPO | Admitting: Podiatry

## 2020-08-05 ENCOUNTER — Encounter: Payer: Self-pay | Admitting: Osteopathic Medicine

## 2020-08-05 DIAGNOSIS — M779 Enthesopathy, unspecified: Secondary | ICD-10-CM | POA: Diagnosis not present

## 2020-08-05 DIAGNOSIS — M79672 Pain in left foot: Secondary | ICD-10-CM | POA: Diagnosis not present

## 2020-08-05 DIAGNOSIS — M19072 Primary osteoarthritis, left ankle and foot: Secondary | ICD-10-CM

## 2020-08-05 MED ORDER — TRIAMCINOLONE ACETONIDE 10 MG/ML IJ SUSP
10.0000 mg | Freq: Once | INTRAMUSCULAR | Status: AC
  Administered 2020-08-05: 10 mg

## 2020-08-07 NOTE — Progress Notes (Signed)
Subjective:   Patient ID: [name], female   DOB: 65 y.o.   MRN: [id number]   HPI 65 year old female presents the office today for concerns of discomfort in the tops of her feet.  This is most on the left foot.  She feels that at times it feels that he has stabbing in the top of the foot.  This is getting worse over the last 3 to 4 months.  She has tried different shoes as well as over-the-counter inserts placement with improvement.  She states the right foot is not as bad.  She previously had x-rays.  No recent injury or falls.  No other concerns today.   Review of Systems  All other systems reviewed and are negative.  Past Medical History:  Diagnosis Date  . Anxiety   . Asthma 02/21/2018  . Back pain   . Depression   . GERD (gastroesophageal reflux disease)   . Hypertension     Past Surgical History:  Procedure Laterality Date  . ABDOMINAL HYSTERECTOMY    . BACK SURGERY    . SHOULDER ARTHROSCOPY W/ ROTATOR CUFF REPAIR    . SHOULDER OPEN ROTATOR CUFF REPAIR    . TUBAL LIGATION       Current Outpatient Medications:  .  albuterol (VENTOLIN HFA) 108 (90 Base) MCG/ACT inhaler, Inhale 2 puffs into the lungs every 6 (six) hours as needed for wheezing or shortness of breath., Disp: 1 each, Rfl: 1 .  apixaban (ELIQUIS) 5 MG TABS tablet, Take 1 tablet (5 mg total) by mouth 2 (two) times daily., Disp: 60 tablet, Rfl: 6 .  buPROPion (WELLBUTRIN XL) 300 MG 24 hr tablet, Take 1 tablet (300 mg total) by mouth daily., Disp: 90 tablet, Rfl: 3 .  CASCARA SAGRADA PO, Take by mouth., Disp: , Rfl:  .  celecoxib (CELEBREX) 100 MG capsule, Take 1-2 capsules (100-200 mg total) by mouth 2 (two) times daily., Disp: 180 capsule, Rfl: 1 .  dicyclomine (BENTYL) 20 MG tablet, Take 1 tablet (20 mg total) by mouth every 6 (six) hours as needed for spasms., Disp: 90 tablet, Rfl: 0 .  DULoxetine (CYMBALTA) 60 MG capsule, Take 60 mg by mouth daily., Disp: , Rfl:  .  estradiol (ESTRACE) 1 MG tablet, Take 1  tablet (1 mg total) by mouth daily., Disp: 90 tablet, Rfl: 3 .  loratadine (CLARITIN) 10 MG tablet, Take 1 tablet (10 mg total) by mouth daily., Disp: 90 tablet, Rfl: 3 .  omeprazole (PRILOSEC) 40 MG capsule, Take 1 capsule (40 mg total) by mouth in the morning and at bedtime., Disp: 180 capsule, Rfl: 1 .  pregabalin (LYRICA) 50 MG capsule, Take 1 capsule (50 mg total) by mouth 3 (three) times daily., Disp: 270 capsule, Rfl: 3 .  vitamin E 1000 UNIT capsule, Take by mouth., Disp: , Rfl:   No Known Allergies        Objective:  Physical Exam  General: AAO x3, NAD  Dermatological: Skin is warm, dry and supple bilateral. There are no open sores, no preulcerative lesions, no rash or signs of infection present.  Vascular: Dorsalis Pedis artery and Posterior Tibial artery pedal pulses are 2/4 bilateral with immedate capillary fill time.  There is no pain with calf compression, swelling, warmth, erythema.   Neruologic: Grossly intact via light touch bilateral.  Sensation intact with Semmes Weinstein monofilament.  Musculoskeletal: There is tenderness to palpation on the left dorsal aspect of the midfoot and there is a localized area of  swelling.  This starts at the TNJ and extends laterally. Extensor tendons appear to be intact.the pain is localized to this area.  Muscular strength 5/5 in all groups tested bilateral.  Gait: Unassisted, Nonantalgic.       Assessment:   65 year old female left foot arthritis, spine     Plan:  -Treatment options discussed including all alternatives, risks, and complications -Etiology of symptoms were discussed -I independently viewed the x-rays with her.  Dorsal spurring is present. -We discussed both conservative as well as surgical treatment options.  For now we will continue conservative treatment.  We discussed tennis shoes and discussed replacing her shoes to avoid tying any pressure on the area of the scar. -We will try to get orthotics authorized by  her insurance.  Hopefully some arch support will be beneficial for her as well. -Steroid injection performed.  The skin was prepped with alcohol and a mixture of 1 cc Kenalog 10, 0.5 cc of Marcaine plain, 0.5 cc of lidocaine plain was infiltrated into the area multiple tenderness without complications.  Postinjection care was discussed. -Voltaren gel prn  Return in about 6 weeks (around 09/16/2020).  Trula Slade DPM

## 2020-08-12 ENCOUNTER — Telehealth: Payer: Self-pay | Admitting: Podiatry

## 2020-08-12 NOTE — Telephone Encounter (Signed)
Left message for pt to call to schedule an appt for orthotics that we did receive auth.

## 2020-08-16 ENCOUNTER — Other Ambulatory Visit: Payer: Self-pay | Admitting: Osteopathic Medicine

## 2020-08-16 ENCOUNTER — Ambulatory Visit (INDEPENDENT_AMBULATORY_CARE_PROVIDER_SITE_OTHER): Payer: PPO | Admitting: Podiatry

## 2020-08-16 ENCOUNTER — Other Ambulatory Visit: Payer: Self-pay

## 2020-08-16 DIAGNOSIS — M19072 Primary osteoarthritis, left ankle and foot: Secondary | ICD-10-CM

## 2020-08-16 DIAGNOSIS — M779 Enthesopathy, unspecified: Secondary | ICD-10-CM

## 2020-08-16 DIAGNOSIS — M778 Other enthesopathies, not elsewhere classified: Secondary | ICD-10-CM | POA: Diagnosis not present

## 2020-08-16 NOTE — Progress Notes (Signed)
Patient presents to be casted for orthotics.  A foam impression was casted for her right and left foot.  Patient is a size 91/2  Patient will be contacted when the orthotics are ready for pick up.

## 2020-08-20 ENCOUNTER — Inpatient Hospital Stay: Payer: PPO | Attending: Family

## 2020-08-20 ENCOUNTER — Other Ambulatory Visit: Payer: Self-pay

## 2020-08-20 ENCOUNTER — Inpatient Hospital Stay: Payer: PPO | Admitting: Family

## 2020-08-20 VITALS — BP 151/93 | HR 100 | Temp 98.0°F | Resp 19

## 2020-08-20 DIAGNOSIS — R030 Elevated blood-pressure reading, without diagnosis of hypertension: Secondary | ICD-10-CM | POA: Diagnosis not present

## 2020-08-20 DIAGNOSIS — I2699 Other pulmonary embolism without acute cor pulmonale: Secondary | ICD-10-CM | POA: Diagnosis not present

## 2020-08-20 DIAGNOSIS — Z7901 Long term (current) use of anticoagulants: Secondary | ICD-10-CM | POA: Diagnosis not present

## 2020-08-20 DIAGNOSIS — R519 Headache, unspecified: Secondary | ICD-10-CM | POA: Diagnosis not present

## 2020-08-20 DIAGNOSIS — Z86711 Personal history of pulmonary embolism: Secondary | ICD-10-CM

## 2020-08-20 DIAGNOSIS — D509 Iron deficiency anemia, unspecified: Secondary | ICD-10-CM | POA: Diagnosis not present

## 2020-08-20 LAB — CMP (CANCER CENTER ONLY)
ALT: 19 U/L (ref 0–44)
AST: 28 U/L (ref 15–41)
Albumin: 3.9 g/dL (ref 3.5–5.0)
Alkaline Phosphatase: 58 U/L (ref 38–126)
Anion gap: 7 (ref 5–15)
BUN: 12 mg/dL (ref 8–23)
CO2: 29 mmol/L (ref 22–32)
Calcium: 9.5 mg/dL (ref 8.9–10.3)
Chloride: 104 mmol/L (ref 98–111)
Creatinine: 0.92 mg/dL (ref 0.44–1.00)
GFR, Estimated: >60 mL/min (ref >60)
Glucose, Bld: 126 mg/dL — ABNORMAL HIGH (ref 70–99)
Potassium: 4.4 mmol/L (ref 3.5–5.1)
Sodium: 140 mmol/L (ref 135–145)
Total Bilirubin: 0.5 mg/dL (ref 0.3–1.2)
Total Protein: 7 g/dL (ref 6.5–8.1)

## 2020-08-20 LAB — CBC WITH DIFFERENTIAL (CANCER CENTER ONLY)
Abs Immature Granulocytes: 0.02 10*3/uL (ref 0.00–0.07)
Basophils Absolute: 0 10*3/uL (ref 0.0–0.1)
Basophils Relative: 1 %
Eosinophils Absolute: 0.1 10*3/uL (ref 0.0–0.5)
Eosinophils Relative: 1 %
HCT: 45.1 % (ref 36.0–46.0)
Hemoglobin: 14.8 g/dL (ref 12.0–15.0)
Immature Granulocytes: 0 %
Lymphocytes Relative: 43 %
Lymphs Abs: 3.5 10*3/uL (ref 0.7–4.0)
MCH: 33 pg (ref 26.0–34.0)
MCHC: 32.8 g/dL (ref 30.0–36.0)
MCV: 100.7 fL — ABNORMAL HIGH (ref 80.0–100.0)
Monocytes Absolute: 0.6 10*3/uL (ref 0.1–1.0)
Monocytes Relative: 7 %
Neutro Abs: 3.9 10*3/uL (ref 1.7–7.7)
Neutrophils Relative %: 48 %
Platelet Count: 249 10*3/uL (ref 150–400)
RBC: 4.48 MIL/uL (ref 3.87–5.11)
RDW: 13.4 % (ref 11.5–15.5)
WBC Count: 8 10*3/uL (ref 4.0–10.5)
nRBC: 0 % (ref 0.0–0.2)

## 2020-08-20 MED ORDER — APIXABAN 2.5 MG PO TABS: 2.5000 mg | ORAL_TABLET | Freq: Two times a day (BID) | ORAL | 3 refills | Status: DC

## 2020-08-20 NOTE — Progress Notes (Signed)
Hematology and Oncology Follow Up Visit  [name] [id number] 04/20/1956 65 y.o. 08/20/2020   Principle Diagnosis:  Pulmonary embolism  Past Work-up: Hyper coag panel negative  Current Therapy: Eliquis 5 mg PO BID   Interim History:  [name] is here today with her husband for follow-up. She is doing fairly well but notes increased fatigue.  She states that she has been under some stress at home and has been having headaches. Her BP today is elevated at 151/103. We advise that she start keeping a log of daily BPs and follow-up with her PCP. She denies any vision changes or dizziness.  No fever, chills, n/v, cough, rash, SOB, chest pain, palpitations, abdominal pain or changes in bowel or bladder habits.  She is scheduled for biopsy of what is felt to be a lipoma on her left side with Dr. Kieth Brightly soon. We discussed her holding her Eliquis 2 days before the procedure and restarting the day after. She verbalized understanding.  No swelling, tenderness, numbness or tingling in her extremities at this time.  No falls or syncope.  She states that her appetite is down but that she is staying well hydrated. Her weight is stable at 219 lbs.  She is quite active walking several times a day with her grandchildren.   ECOG Performance Status: 1 - Symptomatic but completely ambulatory  Medications:  Allergies as of 08/20/2020   No Known Allergies     Medication List       Accurate as of August 20, 2020  3:36 PM. If you have any questions, ask your nurse or doctor.        albuterol 108 (90 Base) MCG/ACT inhaler Commonly known as: VENTOLIN HFA Inhale 2 puffs into the lungs every 6 (six) hours as needed for wheezing or shortness of breath.   apixaban 5 MG Tabs tablet Commonly known as: ELIQUIS Take 1 tablet (5 mg total) by mouth 2 (two) times daily.   buPROPion 300 MG 24 hr tablet Commonly known as: WELLBUTRIN XL Take 1 tablet (300 mg total) by mouth daily.   CASCARA SAGRADA  PO Take by mouth.   celecoxib 100 MG capsule Commonly known as: CELEBREX TAKE ONE TO TWO CAPSULES BY MOUTH TWICE A DAY   dicyclomine 20 MG tablet Commonly known as: BENTYL TAKE 1 TABLET (20 MG TOTAL) BY MOUTH EVERY 6 (SIX) HOURS AS NEEDED FOR SPASMS.   DULoxetine 60 MG capsule Commonly known as: CYMBALTA Take 60 mg by mouth daily.   estradiol 1 MG tablet Commonly known as: ESTRACE Take 1 tablet (1 mg total) by mouth daily.   loratadine 10 MG tablet Commonly known as: CLARITIN Take 1 tablet (10 mg total) by mouth daily.   omeprazole 40 MG capsule Commonly known as: PRILOSEC Take 1 capsule (40 mg total) by mouth in the morning and at bedtime.   pregabalin 50 MG capsule Commonly known as: Lyrica Take 1 capsule (50 mg total) by mouth 3 (three) times daily.   vitamin E 1000 UNIT capsule Take by mouth.       Allergies: No Known Allergies  Past Medical History, Surgical history, Social history, and Family History were reviewed and updated.  Review of Systems: All other 10 point review of systems is negative.   Physical Exam:  vitals were not taken for this visit.   Wt Readings from Last 3 Encounters:  07/30/20 220 lb 1.9 oz (99.8 kg)  06/01/20 222 lb (100.7 kg)  05/20/20 223 lb (101.2 kg)  Ocular: Sclerae unicteric, pupils equal, round and reactive to light Ear-nose-throat: Oropharynx clear, dentition fair Lymphatic: No cervical, supraclavicular or axillary adenopathy Lungs no rales or rhonchi, good excursion bilaterally Heart regular rate and rhythm, no murmur appreciated Abd soft, nontender, positive bowel sounds, no liver or spleen tip palpated on exam, no fluid wave  MSK no focal spinal tenderness, no joint edema Neuro: non-focal, well-oriented, appropriate affect Breasts: Deferred   Lab Results  Component Value Date   WBC 8.0 08/20/2020   HGB 14.8 08/20/2020   HCT 45.1 08/20/2020   MCV 100.7 (H) 08/20/2020   PLT 249 08/20/2020   Lab Results   Component Value Date   FERRITIN 439 (H) 04/20/2020   IRON 88 04/20/2020   TIBC 223 (L) 04/20/2020   UIBC 135 04/20/2020   IRONPCTSAT 39 04/20/2020   Lab Results  Component Value Date   RETICCTPCT 2.2 04/20/2020   RBC 4.48 08/20/2020   No results found for: KPAFRELGTCHN, LAMBDASER, KAPLAMBRATIO No results found for: IGGSERUM, IGA, IGMSERUM No results found for: Odetta Pink, SPEI   Chemistry      Component Value Date/Time   NA 140 08/20/2020 1507   K 4.4 08/20/2020 1507   CL 104 08/20/2020 1507   CO2 29 08/20/2020 1507   BUN 12 08/20/2020 1507   CREATININE 0.92 08/20/2020 1507   CREATININE 0.78 02/09/2020 1415      Component Value Date/Time   CALCIUM 9.5 08/20/2020 1507   ALKPHOS 58 08/20/2020 1507   AST 28 08/20/2020 1507   ALT 19 08/20/2020 1507   BILITOT 0.5 08/20/2020 1507       Impression and Plan: [name] is a very pleasant 65 yo caucasian female withrecent diagnosisof small right upper lobepulmonary embolus.No prior history. She has completed 6 months of full dose Eliquis and will now transition to maintenance dosing at 2.5 mg PO BID.  Also, she states that she is scheduled for biopsy of a lump on her left side. She verbalized understanding that she will hold her Eliquis 2 days before the procedure and restart the day after.  She will also follow-up with her PCP regarding elevated BP and headaches.  Follow-up in 4 months.  She was encouraged to contact our office with any questions or concerns.   Laverna Peace, NP 4/1/20223:36 PM

## 2020-08-27 DIAGNOSIS — D171 Benign lipomatous neoplasm of skin and subcutaneous tissue of trunk: Secondary | ICD-10-CM | POA: Diagnosis not present

## 2020-09-07 ENCOUNTER — Other Ambulatory Visit: Payer: Self-pay | Admitting: Osteopathic Medicine

## 2020-09-12 ENCOUNTER — Other Ambulatory Visit: Payer: Self-pay | Admitting: Osteopathic Medicine

## 2020-09-21 ENCOUNTER — Ambulatory Visit: Payer: PPO | Admitting: Podiatry

## 2020-09-21 ENCOUNTER — Other Ambulatory Visit: Payer: Self-pay

## 2020-09-21 DIAGNOSIS — M779 Enthesopathy, unspecified: Secondary | ICD-10-CM | POA: Diagnosis not present

## 2020-09-21 DIAGNOSIS — M79672 Pain in left foot: Secondary | ICD-10-CM | POA: Diagnosis not present

## 2020-09-21 DIAGNOSIS — M19072 Primary osteoarthritis, left ankle and foot: Secondary | ICD-10-CM

## 2020-09-21 MED ORDER — TRIAMCINOLONE ACETONIDE 10 MG/ML IJ SUSP
5.0000 mg | Freq: Once | INTRAMUSCULAR | Status: AC
  Administered 2020-09-21: 5 mg

## 2020-09-21 MED ORDER — DEXAMETHASONE SODIUM PHOSPHATE 120 MG/30ML IJ SOLN
2.0000 mg | Freq: Once | INTRAMUSCULAR | Status: AC
  Administered 2020-09-21: 2 mg via INTRA_ARTICULAR

## 2020-09-27 ENCOUNTER — Encounter: Payer: Self-pay | Admitting: Podiatry

## 2020-09-28 ENCOUNTER — Telehealth: Payer: Self-pay | Admitting: *Deleted

## 2020-09-28 NOTE — Progress Notes (Signed)
Subjective: 65 year old female presents the office today for follow-up evaluation of pain in the tops of her feet left side worse than right also some pick up orthotics.  She is also interested in possible surgical intervention.  She said overall her symptoms are unchanged.  The first injection did not help much and considering possibly another injection.  Denies any systemic complaints such as fevers, chills, nausea, vomiting. No acute changes since last appointment, and no other complaints at this time.   Objective: AAO x3, NAD DP/PT pulses palpable bilaterally, CRT less than 3 seconds Dorsal spurring present of the midfoot bilaterally with the left side worse than right there is tenderness palpation on the area.  Flexor, extensor tendons appear to be intact.  No significant soft tissue mass identified on today's exam.  MMT 5/5. No pain with calf compression, swelling, warmth, erythema  Assessment: Left midfoot pain, capsulitis/arthritis  Plan: -All treatment options discussed with the patient including all alternatives, risks, complications.  -Repeat steroid injection performed.  Skin was prepped with alcohol and a mixture of 0.5 cc of Marcaine plain, 0.5 cc of dexamethasone phosphate, 0.5 cc of Kenalog 10 was infiltrated into the area of maximal tenderness without complications.  Postinjection care discussed. -Orthotics were dispensed.  Written and oral work instructions provided -MRI ordered of the left foot for surgical planning.  Discussed options for x-ray including exostectomy versus arthrodesis.  Like for the MRI to be performed prior to making any decisions on this. -Patient encouraged to call the office with any questions, concerns, change in symptoms.

## 2020-09-28 NOTE — Telephone Encounter (Signed)
-----   Message from Trula Slade, DPM sent at 09/27/2020  5:44 PM EDT ----- I ordered an MRI last week and she has not heard about it. Can you please follow up on this? Thanks!

## 2020-09-28 NOTE — Telephone Encounter (Signed)
Called and left a message for the patient that the MRI is scheduled for 09-30-2020 at 10:30 am. [name]

## 2020-09-30 ENCOUNTER — Other Ambulatory Visit: Payer: Self-pay

## 2020-09-30 ENCOUNTER — Ambulatory Visit
Admission: RE | Admit: 2020-09-30 | Discharge: 2020-09-30 | Disposition: A | Discharge status: Home / Self Care | Payer: PPO | Source: Ambulatory Visit | Attending: Podiatry | Admitting: Podiatry

## 2020-09-30 DIAGNOSIS — M779 Enthesopathy, unspecified: Secondary | ICD-10-CM

## 2020-09-30 DIAGNOSIS — R600 Localized edema: Secondary | ICD-10-CM | POA: Diagnosis not present

## 2020-09-30 DIAGNOSIS — M19072 Primary osteoarthritis, left ankle and foot: Secondary | ICD-10-CM | POA: Diagnosis not present

## 2020-09-30 DIAGNOSIS — M25475 Effusion, left foot: Secondary | ICD-10-CM | POA: Diagnosis not present

## 2020-10-12 ENCOUNTER — Other Ambulatory Visit: Payer: Self-pay

## 2020-10-12 ENCOUNTER — Ambulatory Visit: Payer: PPO | Admitting: Podiatry

## 2020-10-12 ENCOUNTER — Ambulatory Visit (INDEPENDENT_AMBULATORY_CARE_PROVIDER_SITE_OTHER): Payer: PPO

## 2020-10-12 DIAGNOSIS — M778 Other enthesopathies, not elsewhere classified: Secondary | ICD-10-CM

## 2020-10-12 DIAGNOSIS — M19072 Primary osteoarthritis, left ankle and foot: Secondary | ICD-10-CM | POA: Diagnosis not present

## 2020-10-12 DIAGNOSIS — M79672 Pain in left foot: Secondary | ICD-10-CM | POA: Diagnosis not present

## 2020-10-12 DIAGNOSIS — M779 Enthesopathy, unspecified: Secondary | ICD-10-CM

## 2020-10-12 NOTE — Patient Instructions (Signed)

## 2020-10-14 ENCOUNTER — Ambulatory Visit: Payer: Self-pay | Admitting: General Surgery

## 2020-10-17 ENCOUNTER — Emergency Department
Admission: EM | Admit: 2020-10-17 | Discharge: 2020-10-17 | Disposition: A | Discharge status: Home / Self Care | Payer: PPO | Source: Home / Self Care

## 2020-10-17 ENCOUNTER — Encounter: Payer: Self-pay | Admitting: Emergency Medicine

## 2020-10-17 ENCOUNTER — Other Ambulatory Visit: Payer: Self-pay

## 2020-10-17 DIAGNOSIS — J309 Allergic rhinitis, unspecified: Secondary | ICD-10-CM | POA: Diagnosis not present

## 2020-10-17 DIAGNOSIS — J0100 Acute maxillary sinusitis, unspecified: Secondary | ICD-10-CM

## 2020-10-17 DIAGNOSIS — R059 Cough, unspecified: Secondary | ICD-10-CM

## 2020-10-17 DIAGNOSIS — J40 Bronchitis, not specified as acute or chronic: Secondary | ICD-10-CM

## 2020-10-17 MED ORDER — FEXOFENADINE HCL 180 MG PO TABS: 180.0000 mg | ORAL_TABLET | Freq: Every day | ORAL | 0 refills | Status: DC

## 2020-10-17 MED ORDER — CEFDINIR 300 MG PO CAPS: 300.0000 mg | ORAL_CAPSULE | Freq: Two times a day (BID) | ORAL | 0 refills | Status: AC

## 2020-10-17 MED ORDER — BENZONATATE 200 MG PO CAPS: 200.0000 mg | ORAL_CAPSULE | Freq: Three times a day (TID) | ORAL | 0 refills | Status: AC | PRN

## 2020-10-17 MED ORDER — PREDNISONE 20 MG PO TABS: ORAL_TABLET | ORAL | 0 refills | Status: DC

## 2020-10-17 MED ORDER — METHYLPREDNISOLONE ACETATE 80 MG/ML IJ SUSP
80.0000 mg | Freq: Once | INTRAMUSCULAR | Status: AC
  Administered 2020-10-17: 80 mg via INTRAMUSCULAR

## 2020-10-17 NOTE — Discharge Instructions (Signed)
Advised patient to take medication as directed with food to completion.  Advised patient to take Allegra 180 mg daily for the next 5 days, then as needed.  Instructed patient not to start oral prednisone burst until tomorrow morning, Monday, 10/18/2020.  May take [facility] daily, or as needed as adjunct for cough.  Encourage patient increase daily water intake while taking these medications.

## 2020-10-17 NOTE — ED Provider Notes (Signed)
[name]    CSN: [id number] Arrival date & time: 10/17/20  1323      History   Chief Complaint Chief Complaint  Patient presents with  . Cough    HPI [name] is a 65 y.o. female.   HPI 65 year old female presents with cough, congestion, and fatigue for 3 days.  Denies fever.  Patient is vaccinated for COVID-19.  Past Medical History:  Diagnosis Date  . Anxiety   . Asthma 02/21/2018  . Back pain   . Depression   . GERD (gastroesophageal reflux disease)   . Hypertension     Patient Active Problem List   Diagnosis Date Noted  . At risk for obstructive sleep apnea 03/26/2020  . History of pulmonary embolus (PE) 03/26/2020  . Basal cell carcinoma (BCC) of right ala nasi 02/21/2019  . Basal cell carcinoma (BCC) of right upper eyelid 02/07/2019  . Dysphagia 10/30/2018  . History of esophageal dilatation 10/30/2018  . Chronic right shoulder pain 10/15/2018  . Asthma 02/21/2018  . Paralyzed hemidiaphragm 02/21/2018  . Elevated hemidiaphragm 02/05/2018  . Atelectasis 02/05/2018  . Anxiety 02/01/2018  . Cervical neck pain with evidence of disc disease 02/01/2018  . BMI 38.0-38.9,adult 02/01/2018  . Cataract 04/01/2015  . Depression 04/01/2015  . Hyperlipidemia 04/01/2015  . Hypertension 04/01/2015  . Disorder of intervertebral disc of cervical spine 04/01/2015  . Lumbar disc disease 04/01/2015  . Hormone replacement therapy (postmenopausal) 07/21/2014  . Fatty (change of) liver, not elsewhere classified 05/29/2012    Past Surgical History:  Procedure Laterality Date  . ABDOMINAL HYSTERECTOMY    . BACK SURGERY    . SHOULDER ARTHROSCOPY W/ ROTATOR CUFF REPAIR    . SHOULDER OPEN ROTATOR CUFF REPAIR    . TUBAL LIGATION      OB History   No obstetric history on file.      Home Medications    Prior to Admission medications   Medication Sig Start Date End Date Taking? Authorizing Provider  albuterol (VENTOLIN HFA) 108 (90 Base) MCG/ACT  inhaler Inhale 2 puffs into the lungs every 6 (six) hours as needed for wheezing or shortness of breath. 06/10/20  Yes Martyn Ehrich, NP  apixaban (ELIQUIS) 2.5 MG TABS tablet Take 1 tablet (2.5 mg total) by mouth 2 (two) times daily. 08/20/20  Yes [place], Holli Humbles, NP  benzonatate (TESSALON) 200 MG capsule Take 1 capsule (200 mg total) by mouth 3 (three) times daily as needed for up to 7 days for cough. 10/17/20 10/24/20 Yes Eliezer Lofts, FNP  buPROPion (WELLBUTRIN XL) 300 MG 24 hr tablet Take 1 tablet (300 mg total) by mouth daily. 05/07/20  Yes Emeterio Reeve, DO  CASCARA SAGRADA PO Take by mouth.   Yes [provider]  cefdinir (OMNICEF) 300 MG capsule Take 1 capsule (300 mg total) by mouth 2 (two) times daily for 7 days. 10/17/20 10/24/20 Yes Eliezer Lofts, FNP  celecoxib (CELEBREX) 100 MG capsule TAKE ONE TO TWO CAPSULES BY MOUTH TWICE A DAY 08/16/20  Yes Emeterio Reeve, DO  dicyclomine (BENTYL) 20 MG tablet TAKE 1 TABLET (20 MG TOTAL) BY MOUTH EVERY 6 (SIX) HOURS AS NEEDED FOR SPASMS. 09/07/20  Yes Emeterio Reeve, DO  DULoxetine (CYMBALTA) 60 MG capsule Take 60 mg by mouth daily. 05/16/20  Yes [provider]  estradiol (ESTRACE) 1 MG tablet Take 1 tablet (1 mg total) by mouth daily. 05/11/20  Yes Emeterio Reeve, DO  fexofenadine [facility] ALLERGY) 180 MG tablet Take 1  tablet (180 mg total) by mouth daily for 15 days. 10/17/20 11/01/20 Yes Eliezer Lofts, FNP  loratadine (CLARITIN) 10 MG tablet Take 1 tablet (10 mg total) by mouth daily. 03/18/20  Yes Emeterio Reeve, DO  omeprazole (PRILOSEC) 40 MG capsule TAKE 1 CAPSULE BY MOUTH IN THE MORNING AND AT BEDTIME. 09/13/20  Yes Emeterio Reeve, DO  predniSONE (DELTASONE) 20 MG tablet Take 3 tabs PO daily x 5 days. 10/17/20  Yes Eliezer Lofts, FNP  vitamin E 1000 UNIT capsule Take by mouth.   Yes [provider]  pregabalin (LYRICA) 50 MG capsule Take 1 capsule (50 mg total) by mouth 3 (three) times  daily. 05/11/20 08/09/20  Emeterio Reeve, DO    Family History Family History  Problem Relation Age of Onset  . Cancer Mother        lung  . Cancer Father        lung  . Hypertension Father   . Diabetes Father   . Non-Hodgkin's lymphoma Son   . Colon cancer Maternal Aunt   . Esophageal cancer Maternal Uncle   . Rectal cancer Neg Hx   . Stomach cancer Neg Hx     Social History Social History   Tobacco Use  . Smoking status: Passive Smoke Exposure - Never Smoker  . Smokeless tobacco: Never Used  Vaping Use  . Vaping Use: Never used  Substance Use Topics  . Alcohol use: Yes    Comment: occasionally  . Drug use: Never     Allergies   Patient has no known allergies.   Review of Systems Review of Systems  HENT: Positive for congestion, postnasal drip, sinus pressure and sinus pain.   Eyes: Negative.   Respiratory: Positive for cough.   Cardiovascular: Negative.   Gastrointestinal: Negative.   Genitourinary: Negative.   Musculoskeletal: Negative.   Skin: Negative.   Neurological: Negative.      Physical Exam Triage Vital Signs ED Triage Vitals [10/17/20 1426]  Enc Vitals Group     BP 121/83     Pulse Rate (!) 101     Resp      Temp 98.6 F (37 C)     Temp Source Oral     SpO2 95 %     Weight      Height      Head Circumference      Peak Flow      Pain Score 0     Pain Loc      Pain Edu?      Excl. in [place]?    No data found.  Updated Vital Signs BP 121/83 (BP Location: Right Arm)   Pulse (!) 101   Temp 98.6 F (37 C) (Oral)   SpO2 95%      Physical Exam Vitals and nursing note reviewed.  Constitutional:      General: She is not in acute distress.    Appearance: Normal appearance. She is ill-appearing.  HENT:     Head: Normocephalic and atraumatic.     Right Ear: Tympanic membrane and ear canal normal.     Left Ear: Tympanic membrane and ear canal normal.     Nose: Nose normal.     Mouth/Throat:     Mouth: Mucous membranes are  moist.     Pharynx: Oropharynx is clear.     Comments: Moderate amount of clear drainage of posterior oropharynx noted Eyes:     Extraocular Movements: Extraocular movements intact.     Conjunctiva/sclera: Conjunctivae  normal.     Pupils: Pupils are equal, round, and reactive to light.  Cardiovascular:     Rate and Rhythm: Normal rate and regular rhythm.     Pulses: Normal pulses.  Pulmonary:     Effort: Pulmonary effort is normal.     Comments: Diffuse scattered rhonchi noted throughout, no wheeze, crackles, or rales Musculoskeletal:        General: Normal range of motion.     Cervical back: Normal range of motion and neck supple. Tenderness present.  Lymphadenopathy:     Cervical: Cervical adenopathy present.  Skin:    General: Skin is warm and dry.  Neurological:     General: No focal deficit present.     Mental Status: She is alert and oriented to person, place, and time.  Psychiatric:        Behavior: Behavior normal.      UC Treatments / Results  Labs (all labs ordered are listed, but only abnormal results are displayed) Labs Reviewed - No data to display  EKG   Radiology No results found.  Procedures Procedures (including critical care time)  Medications Ordered in UC Medications  methylPREDNISolone acetate (DEPO-MEDROL) injection 80 mg (has no administration in time range)    Initial Impression / Assessment and Plan / UC Course  I have reviewed the triage vital signs and the nursing notes.  Pertinent labs & imaging results that were available during my care of the patient were reviewed by me and considered in my medical decision making (see chart for details).    MDM: 1.  Cough, 2.  Bronchitis, 3.  Subacute maxillary sinusitis, 4.  Allergic rhinitis.  Patient discharged home, hemodynamically stable Final Clinical Impressions(s) / UC Diagnoses   Final diagnoses:  Cough  Bronchitis  Subacute maxillary sinusitis  Allergic rhinitis, unspecified  seasonality, unspecified trigger     Discharge Instructions     Advised patient to take medication as directed with food to completion.  Advised patient to take Allegra 180 mg daily for the next 5 days, then as needed.  Instructed patient not to start oral prednisone burst until tomorrow morning, Monday, 10/18/2020.  May take [facility] daily, or as needed as adjunct for cough.  Encourage patient increase daily water intake while taking these medications.    ED Prescriptions    Medication Sig Dispense Auth. Provider   cefdinir (OMNICEF) 300 MG capsule Take 1 capsule (300 mg total) by mouth 2 (two) times daily for 7 days. 14 capsule Eliezer Lofts, FNP   fexofenadine [facility] ALLERGY) 180 MG tablet Take 1 tablet (180 mg total) by mouth daily for 15 days. 15 tablet Eliezer Lofts, FNP   predniSONE (DELTASONE) 20 MG tablet Take 3 tabs PO daily x 5 days. 15 tablet Eliezer Lofts, FNP   benzonatate (TESSALON) 200 MG capsule Take 1 capsule (200 mg total) by mouth 3 (three) times daily as needed for up to 7 days for cough. 30 capsule Eliezer Lofts, FNP     PDMP not reviewed this encounter.   Eliezer Lofts, [place] 10/17/20 1824

## 2020-10-17 NOTE — ED Triage Notes (Signed)
Patient c/o non-productive cough since Thursday.  Congestion and feeling fatigued.  Patient has taken Mucinex and Tylenol.  Patient is vaccinated for COVID.

## 2020-10-18 NOTE — Progress Notes (Signed)
Subjective: 65 year old female presents the office today for surgical consultation given chronic pain of the left dorsal midfoot.  She is tried injections, inserts without significant improvement.  She wants to consider surgical intervention given the ongoing discomfort. Denies any systemic complaints such as fevers, chills, nausea, vomiting. No acute changes since last appointment, and no other complaints at this time.   Objective: AAO x3, NAD DP/PT pulses palpable bilaterally, CRT less than 3 seconds Tenderness to the dorsal aspect of the midfoot.  Localized edema present in this area.  Flexor, extensor tendons appear to be intact.  Dorsal spurring present of the midfoot as well.  MMT 5/5. No pain with calf compression, swelling, warmth, erythema  Assessment: Significant arthritic changes with osteophytes present left foot  Plan: -All treatment options discussed with the patient including all alternatives, risks, complications.  -Reviewed the MRI with her via phone again discussed today.  Significant arthritic changes present.  I repeated the x-rays today and I marked the areas where she gets discomfort.  The x-rays do show this along the areas of 2 osteophytes. -Discussed multiple treatment options with her including removal of osteophytes versus arthrodesis.  Given significant arthritis and pain and fracture fusion of a couple of joints is going to be beneficial for her.  I discussed with her starting with trying to excise the osteophytes and cleaning of the area to see if this will be helpful as well as a PRP injection.  Consent was obtained for this today.  I will discussed with the group however prior. -The incision placement as well as the postoperative course was discussed with the patient. I discussed risks of the surgery which include, but not limited to, infection, bleeding, pain, swelling, need for further surgery, delayed or nonhealing, painful or ugly scar, numbness or sensation  changes, over/under correction, recurrence, transfer lesions, further deformity, hardware failure, DVT/PE, loss of toe/foot. Patient understands these risks and wishes to proceed with surgery. The surgical consent was reviewed with the patient all 3 pages were signed. No promises or guarantees were given to the outcome of the procedure. All questions were answered to the best of my ability. Before the surgery the patient was encouraged to call the office if there is any further questions. The surgery will be performed at the [facility] on an outpatient basis. -Patient encouraged to call the office with any questions, concerns, change in symptoms.   Trula Slade DPM

## 2020-10-22 ENCOUNTER — Encounter: Payer: Self-pay | Admitting: Osteopathic Medicine

## 2020-10-22 ENCOUNTER — Ambulatory Visit: Payer: PPO | Admitting: Podiatry

## 2020-11-04 NOTE — Patient Instructions (Signed)
DUE TO COVID-19 ONLY ONE VISITOR IS ALLOWED TO COME WITH YOU AND STAY IN THE WAITING ROOM ONLY DURING PRE OP AND PROCEDURE DAY OF SURGERY. THE 1 VISITOR  MAY VISIT WITH YOU AFTER SURGERY IN YOUR PRIVATE ROOM DURING VISITING HOURS ONLY!               [place]   Your procedure is scheduled on: 11/12/20   Report to [facility] Main  Entrance   Report to admitting at :11:15 AM     Call this number if you have problems the morning of surgery [phone]    Remember: Do not eat solid food :After Midnight. Clear liquids until: 10:15 am.  CLEAR LIQUID DIET  Foods Allowed                                                                     Foods Excluded  Coffee and tea, regular and decaf                             liquids that you cannot  Plain Jell-O any favor except red or purple                                           see through such as: Fruit ices (not with fruit pulp)                                     milk, soups, orange juice  Iced Popsicles                                    All solid food Carbonated beverages, regular and diet                                    Cranberry, grape and apple juices Sports drinks like Gatorade Lightly seasoned clear broth or consume(fat free) Sugar, honey syrup  Sample Menu Breakfast                                Lunch                                     Supper Cranberry juice                    Beef broth                            Chicken broth Jell-O                                     Grape  juice                           Apple juice Coffee or tea                        Jell-O                                      Popsicle                                                Coffee or tea                        Coffee or tea  _____________________________________________________________________  BRUSH YOUR TEETH MORNING OF SURGERY AND RINSE YOUR MOUTH OUT, NO CHEWING GUM CANDY OR MINTS.    Take these medicines the morning of  surgery with A SIP OF WATER: pregabalin,bupropion,duloxetine,allegra,loratadine,prednisone,estradiol,omeprazole.                               You may not have any metal on your body including hair pins and              piercings  Do not wear jewelry, make-up, lotions, powders or perfumes, deodorant             Do not wear nail polish on your fingernails.  Do not shave  48 hours prior to surgery.    Do not bring valuables to the hospital. [place].  Contacts, dentures or bridgework may not be worn into surgery.  Leave suitcase in the car. After surgery it may be brought to your room.     Patients discharged the day of surgery will not be allowed to drive home. IF YOU ARE HAVING SURGERY AND GOING HOME THE SAME DAY, YOU MUST HAVE AN ADULT TO DRIVE YOU HOME AND BE WITH YOU FOR 24 HOURS. YOU MAY GO HOME BY TAXI OR UBER OR ORTHERWISE, BUT AN ADULT MUST ACCOMPANY YOU HOME AND STAY WITH YOU FOR 24 HOURS.  Name and phone number of your driver:  Special Instructions: N/A              Please read over the following fact sheets you were given: _____________________________________________________________________           [facility] - Preparing for Surgery Before surgery, you can play an important role.  Because skin is not sterile, your skin needs to be as free of germs as possible.  You can reduce the number of germs on your skin by washing with CHG (chlorahexidine gluconate) soap before surgery.  CHG is an antiseptic cleaner which kills germs and bonds with the skin to continue killing germs even after washing. Please DO NOT use if you have an allergy to CHG or antibacterial soaps.  If your skin becomes reddened/irritated stop using the CHG and inform your nurse when you arrive at Short Stay. Do not shave (including legs and underarms) for at least 48 hours prior to the first CHG shower.  You may shave your face/neck. Please follow these instructions  carefully:  1.  Shower with CHG Soap the night before surgery and the  morning of Surgery.  2.  If you choose to wash your hair, wash your hair first as usual with your  normal  shampoo.  3.  After you shampoo, rinse your hair and body thoroughly to remove the  shampoo.                           4.  Use CHG as you would any other liquid soap.  You can apply chg directly  to the skin and wash                       Gently with a scrungie or clean washcloth.  5.  Apply the CHG Soap to your body ONLY FROM THE NECK DOWN.   Do not use on face/ open                           Wound or open sores. Avoid contact with eyes, ears mouth and genitals (private parts).                       Wash face,  Genitals (private parts) with your normal soap.             6.  Wash thoroughly, paying special attention to the area where your surgery  will be performed.  7.  Thoroughly rinse your body with warm water from the neck down.  8.  DO NOT shower/wash with your normal soap after using and rinsing off  the CHG Soap.                9.  Pat yourself dry with a clean towel.            10.  Wear clean pajamas.            11.  Place clean sheets on your bed the night of your first shower and do not  sleep with pets. Day of Surgery : Do not apply any lotions/deodorants the morning of surgery.  Please wear clean clothes to the hospital/surgery center.  FAILURE TO FOLLOW THESE INSTRUCTIONS MAY RESULT IN THE CANCELLATION OF YOUR SURGERY PATIENT SIGNATURE_________________________________  NURSE SIGNATURE__________________________________  ________________________________________________________________________

## 2020-11-05 ENCOUNTER — Encounter (HOSPITAL_COMMUNITY)
Admission: RE | Admit: 2020-11-05 | Discharge: 2020-11-05 | Disposition: A | Discharge status: Home / Self Care | Payer: PPO | Source: Ambulatory Visit | Attending: General Surgery | Admitting: General Surgery

## 2020-11-05 ENCOUNTER — Other Ambulatory Visit: Payer: Self-pay

## 2020-11-05 ENCOUNTER — Encounter (HOSPITAL_COMMUNITY): Payer: Self-pay

## 2020-11-05 DIAGNOSIS — Z01812 Encounter for preprocedural laboratory examination: Secondary | ICD-10-CM | POA: Insufficient documentation

## 2020-11-05 HISTORY — DX: Malignant (primary) neoplasm, unspecified: C80.1

## 2020-11-05 LAB — BASIC METABOLIC PANEL
Anion gap: 9 (ref 5–15)
BUN: 11 mg/dL (ref 8–23)
CO2: 25 mmol/L (ref 22–32)
Calcium: 8.5 mg/dL — ABNORMAL LOW (ref 8.9–10.3)
Chloride: 107 mmol/L (ref 98–111)
Creatinine, Ser: 0.92 mg/dL (ref 0.44–1.00)
GFR, Estimated: >60 mL/min (ref >60)
Glucose, Bld: 91 mg/dL (ref 70–99)
Potassium: 4 mmol/L (ref 3.5–5.1)
Sodium: 141 mmol/L (ref 135–145)

## 2020-11-05 LAB — CBC
HCT: 42.2 % (ref 36.0–46.0)
Hemoglobin: 13.5 g/dL (ref 12.0–15.0)
MCH: 32.6 pg (ref 26.0–34.0)
MCHC: 32 g/dL (ref 30.0–36.0)
MCV: 101.9 fL — ABNORMAL HIGH (ref 80.0–100.0)
Platelets: 271 10*3/uL (ref 150–400)
RBC: 4.14 MIL/uL (ref 3.87–5.11)
RDW: 14.1 % (ref 11.5–15.5)
WBC: 6.9 10*3/uL (ref 4.0–10.5)
nRBC: 0 % (ref 0.0–0.2)

## 2020-11-05 NOTE — Progress Notes (Signed)
COVID Vaccine Completed: Yes Date COVID Vaccine completed: 09/15/20 COVID vaccine manufacturer:  [employer]     PCP - DO: Ricki Miller. Cardiologist -   Chest x-ray 02/09/20 Epic   EKG - 02/09/20 EPIC Stress Test -  ECHO -  Cardiac Cath -  Pacemaker/ICD device last checked:  Sleep Study - Yes CPAP - NO  Fasting Blood Sugar -  Checks Blood Sugar _____ times a day  Blood Thinner Instructions: Aspirin Instructions: Last Dose:  Anesthesia review: HTN,OSA(No CPAP)  Patient denies shortness of breath, fever, cough and chest pain at PAT appointment   Patient verbalized understanding of instructions that were given to them at the PAT appointment. Patient was also instructed that they will need to review over the PAT instructions again at home before surgery.

## 2020-11-09 ENCOUNTER — Other Ambulatory Visit (HOSPITAL_COMMUNITY)
Admission: RE | Admit: 2020-11-09 | Discharge: 2020-11-09 | Disposition: A | Discharge status: Home / Self Care | Payer: PPO | Source: Ambulatory Visit | Attending: General Surgery | Admitting: General Surgery

## 2020-11-09 DIAGNOSIS — Z01812 Encounter for preprocedural laboratory examination: Secondary | ICD-10-CM | POA: Diagnosis not present

## 2020-11-09 DIAGNOSIS — Z20822 Contact with and (suspected) exposure to covid-19: Secondary | ICD-10-CM | POA: Insufficient documentation

## 2020-11-09 LAB — SARS CORONAVIRUS 2 (TAT 6-24 HRS): SARS Coronavirus 2: NEGATIVE

## 2020-11-12 ENCOUNTER — Ambulatory Visit (HOSPITAL_COMMUNITY)
Admission: RE | Admit: 2020-11-12 | Discharge: 2020-11-12 | Disposition: A | Discharge status: Home / Self Care | Payer: PPO | Attending: General Surgery | Admitting: General Surgery

## 2020-11-12 ENCOUNTER — Encounter (HOSPITAL_COMMUNITY)
Admission: RE | Disposition: A | Discharge status: Home / Self Care | Payer: Self-pay | Source: Home / Self Care | Attending: General Surgery

## 2020-11-12 ENCOUNTER — Encounter (HOSPITAL_COMMUNITY): Payer: Self-pay | Admitting: General Surgery

## 2020-11-12 ENCOUNTER — Ambulatory Visit (HOSPITAL_COMMUNITY): Payer: PPO | Admitting: Certified Registered"

## 2020-11-12 DIAGNOSIS — Z90710 Acquired absence of both cervix and uterus: Secondary | ICD-10-CM | POA: Insufficient documentation

## 2020-11-12 DIAGNOSIS — Z800 Family history of malignant neoplasm of digestive organs: Secondary | ICD-10-CM | POA: Diagnosis not present

## 2020-11-12 DIAGNOSIS — D1801 Hemangioma of skin and subcutaneous tissue: Secondary | ICD-10-CM | POA: Diagnosis not present

## 2020-11-12 DIAGNOSIS — Z801 Family history of malignant neoplasm of trachea, bronchus and lung: Secondary | ICD-10-CM | POA: Insufficient documentation

## 2020-11-12 DIAGNOSIS — I10 Essential (primary) hypertension: Secondary | ICD-10-CM | POA: Diagnosis not present

## 2020-11-12 DIAGNOSIS — Z807 Family history of other malignant neoplasms of lymphoid, hematopoietic and related tissues: Secondary | ICD-10-CM | POA: Insufficient documentation

## 2020-11-12 DIAGNOSIS — R222 Localized swelling, mass and lump, trunk: Secondary | ICD-10-CM | POA: Insufficient documentation

## 2020-11-12 DIAGNOSIS — R1900 Intra-abdominal and pelvic swelling, mass and lump, unspecified site: Secondary | ICD-10-CM | POA: Diagnosis not present

## 2020-11-12 DIAGNOSIS — Z7722 Contact with and (suspected) exposure to environmental tobacco smoke (acute) (chronic): Secondary | ICD-10-CM | POA: Diagnosis not present

## 2020-11-12 DIAGNOSIS — E785 Hyperlipidemia, unspecified: Secondary | ICD-10-CM | POA: Diagnosis not present

## 2020-11-12 DIAGNOSIS — D1809 Hemangioma of other sites: Secondary | ICD-10-CM | POA: Diagnosis not present

## 2020-11-12 DIAGNOSIS — K760 Fatty (change of) liver, not elsewhere classified: Secondary | ICD-10-CM | POA: Diagnosis not present

## 2020-11-12 HISTORY — PX: EXCISION MASS ABDOMINAL: SHX6701

## 2020-11-12 SURGERY — EXCISION, MASS, TORSO
Anesthesia: General | Site: Abdomen | Laterality: Left

## 2020-11-12 MED ORDER — LIDOCAINE 2% (20 MG/ML) 5 ML SYRINGE
INTRAMUSCULAR | Status: AC
  Filled 2020-11-12: qty 5

## 2020-11-12 MED ORDER — PHENYLEPHRINE 40 MCG/ML (10ML) SYRINGE FOR IV PUSH (FOR BLOOD PRESSURE SUPPORT)
PREFILLED_SYRINGE | INTRAVENOUS | Status: DC | PRN
  Administered 2020-11-12: 80 ug via INTRAVENOUS
  Administered 2020-11-12: 120 ug via INTRAVENOUS

## 2020-11-12 MED ORDER — FENTANYL CITRATE (PF) 100 MCG/2ML IJ SOLN
INTRAMUSCULAR | Status: AC
  Filled 2020-11-12: qty 2

## 2020-11-12 MED ORDER — LIDOCAINE 2% (20 MG/ML) 5 ML SYRINGE
INTRAMUSCULAR | Status: DC | PRN
  Administered 2020-11-12: 60 mg via INTRAVENOUS

## 2020-11-12 MED ORDER — CHLORHEXIDINE GLUCONATE CLOTH 2 % EX PADS: 6.0000 | MEDICATED_PAD | Freq: Once | CUTANEOUS | Status: DC

## 2020-11-12 MED ORDER — ENSURE PRE-SURGERY PO LIQD
296.0000 mL | Freq: Once | ORAL | Status: DC
  Filled 2020-11-12: qty 296

## 2020-11-12 MED ORDER — CELECOXIB 200 MG PO CAPS
400.0000 mg | ORAL_CAPSULE | ORAL | Status: AC
  Administered 2020-11-12: 400 mg via ORAL
  Filled 2020-11-12: qty 2

## 2020-11-12 MED ORDER — PROMETHAZINE HCL 25 MG/ML IJ SOLN: 6.2500 mg | INTRAMUSCULAR | Status: DC | PRN

## 2020-11-12 MED ORDER — CHLORHEXIDINE GLUCONATE 0.12 % MT SOLN
15.0000 mL | Freq: Once | OROMUCOSAL | Status: AC
  Administered 2020-11-12: 15 mL via OROMUCOSAL

## 2020-11-12 MED ORDER — PROPOFOL 10 MG/ML IV BOLUS
INTRAVENOUS | Status: DC | PRN
  Administered 2020-11-12: 200 mg via INTRAVENOUS

## 2020-11-12 MED ORDER — MIDAZOLAM HCL 2 MG/2ML IJ SOLN
INTRAMUSCULAR | Status: AC
  Filled 2020-11-12: qty 2

## 2020-11-12 MED ORDER — FENTANYL CITRATE (PF) 100 MCG/2ML IJ SOLN
INTRAMUSCULAR | Status: DC | PRN
  Administered 2020-11-12 (×3): 25 ug via INTRAVENOUS

## 2020-11-12 MED ORDER — ROCURONIUM BROMIDE 10 MG/ML (PF) SYRINGE
PREFILLED_SYRINGE | INTRAVENOUS | Status: AC
  Filled 2020-11-12: qty 10

## 2020-11-12 MED ORDER — HYDROMORPHONE HCL 1 MG/ML IJ SOLN: 0.2500 mg | INTRAMUSCULAR | Status: DC | PRN

## 2020-11-12 MED ORDER — LACTATED RINGERS IV SOLN: INTRAVENOUS | Status: DC

## 2020-11-12 MED ORDER — OXYCODONE HCL 5 MG/5ML PO SOLN: 5.0000 mg | Freq: Once | ORAL | Status: AC | PRN

## 2020-11-12 MED ORDER — BUPIVACAINE-EPINEPHRINE (PF) 0.25% -1:200000 IJ SOLN
INTRAMUSCULAR | Status: AC
  Filled 2020-11-12: qty 30

## 2020-11-12 MED ORDER — ONDANSETRON HCL 4 MG/2ML IJ SOLN
INTRAMUSCULAR | Status: DC | PRN
  Administered 2020-11-12: 4 mg via INTRAVENOUS

## 2020-11-12 MED ORDER — DEXAMETHASONE SODIUM PHOSPHATE 10 MG/ML IJ SOLN
INTRAMUSCULAR | Status: AC
  Filled 2020-11-12: qty 1

## 2020-11-12 MED ORDER — 0.9 % SODIUM CHLORIDE (POUR BTL) OPTIME
TOPICAL | Status: DC | PRN
  Administered 2020-11-12: 1000 mL

## 2020-11-12 MED ORDER — DEXAMETHASONE SODIUM PHOSPHATE 10 MG/ML IJ SOLN
INTRAMUSCULAR | Status: DC | PRN
  Administered 2020-11-12: 10 mg via INTRAVENOUS

## 2020-11-12 MED ORDER — OXYCODONE HCL 5 MG PO TABS
5.0000 mg | ORAL_TABLET | Freq: Once | ORAL | Status: AC | PRN
  Administered 2020-11-12: 5 mg via ORAL

## 2020-11-12 MED ORDER — IBUPROFEN 800 MG PO TABS: 800.0000 mg | ORAL_TABLET | Freq: Three times a day (TID) | ORAL | 0 refills | Status: DC | PRN

## 2020-11-12 MED ORDER — ONDANSETRON HCL 4 MG/2ML IJ SOLN
INTRAMUSCULAR | Status: AC
  Filled 2020-11-12: qty 2

## 2020-11-12 MED ORDER — ACETAMINOPHEN 500 MG PO TABS
1000.0000 mg | ORAL_TABLET | ORAL | Status: AC
  Administered 2020-11-12: 1000 mg via ORAL
  Filled 2020-11-12: qty 2

## 2020-11-12 MED ORDER — ORAL CARE MOUTH RINSE: 15.0000 mL | Freq: Once | OROMUCOSAL | Status: AC

## 2020-11-12 MED ORDER — PROPOFOL 10 MG/ML IV BOLUS
INTRAVENOUS | Status: AC
  Filled 2020-11-12: qty 20

## 2020-11-12 MED ORDER — OXYCODONE HCL 5 MG PO TABS
ORAL_TABLET | ORAL | Status: AC
  Filled 2020-11-12: qty 1

## 2020-11-12 MED ORDER — MIDAZOLAM HCL 2 MG/2ML IJ SOLN
INTRAMUSCULAR | Status: DC | PRN
  Administered 2020-11-12: 2 mg via INTRAVENOUS

## 2020-11-12 MED ORDER — PHENYLEPHRINE 40 MCG/ML (10ML) SYRINGE FOR IV PUSH (FOR BLOOD PRESSURE SUPPORT)
PREFILLED_SYRINGE | INTRAVENOUS | Status: AC
  Filled 2020-11-12: qty 10

## 2020-11-12 MED ORDER — BUPIVACAINE-EPINEPHRINE 0.25% -1:200000 IJ SOLN
INTRAMUSCULAR | Status: DC | PRN
  Administered 2020-11-12: 20 mL

## 2020-11-12 MED ORDER — ACETAMINOPHEN 500 MG PO TABS: 1000.0000 mg | ORAL_TABLET | Freq: Once | ORAL | Status: DC

## 2020-11-12 MED ORDER — CEFAZOLIN SODIUM-DEXTROSE 2-4 GM/100ML-% IV SOLN
2.0000 g | INTRAVENOUS | Status: AC
  Administered 2020-11-12: 2 g via INTRAVENOUS
  Filled 2020-11-12: qty 100

## 2020-11-12 SURGICAL SUPPLY — 40 items
ADH SKN CLS APL DERMABOND .7 (GAUZE/BANDAGES/DRESSINGS) ×1
APL PRP STRL LF DISP 70% ISPRP (MISCELLANEOUS)
APL SKNCLS STERI-STRIP NONHPOA (GAUZE/BANDAGES/DRESSINGS)
BAG COUNTER SPONGE SURGICOUNT (BAG) IMPLANT
BAG SPNG CNTER NS LX DISP (BAG)
BENZOIN TINCTURE PRP APPL 2/3 (GAUZE/BANDAGES/DRESSINGS) IMPLANT
BLADE CLIPPER SURG (BLADE) IMPLANT
BLADE SURG 15 STRL LF DISP TIS (BLADE) IMPLANT
BLADE SURG 15 STRL SS (BLADE) ×2
BLADE SURG SZ10 CARB STEEL (BLADE) ×1 IMPLANT
CHLORAPREP W/TINT 26 (MISCELLANEOUS) IMPLANT
CNTNR URN SCR LID CUP LEK RST (MISCELLANEOUS) IMPLANT
CONT SPEC 4OZ STRL OR WHT (MISCELLANEOUS) ×2
COVER SURGICAL LIGHT HANDLE (MISCELLANEOUS) ×2 IMPLANT
DERMABOND ADVANCED (GAUZE/BANDAGES/DRESSINGS) ×1
DERMABOND ADVANCED .7 DNX12 (GAUZE/BANDAGES/DRESSINGS) IMPLANT
DRAPE LAPAROTOMY T 98X78 PEDS (DRAPES) ×2 IMPLANT
DRAPE UTILITY XL STRL (DRAPES) ×2 IMPLANT
DRSG TEGADERM 4X4.75 (GAUZE/BANDAGES/DRESSINGS) IMPLANT
ELECT REM PT RETURN 15FT ADLT (MISCELLANEOUS) ×2 IMPLANT
GAUZE SPONGE 4X4 12PLY STRL (GAUZE/BANDAGES/DRESSINGS) IMPLANT
GLOVE SURG POLYISO LF SZ7 (GLOVE) ×2 IMPLANT
GLOVE SURG UNDER POLY LF SZ7.5 (GLOVE) ×2 IMPLANT
GOWN STRL REUS W/TWL LRG LVL3 (GOWN DISPOSABLE) ×4 IMPLANT
GOWN STRL REUS W/TWL XL LVL3 (GOWN DISPOSABLE) ×2 IMPLANT
KIT BASIN OR (CUSTOM PROCEDURE TRAY) ×2 IMPLANT
KIT TURNOVER KIT A (KITS) ×2 IMPLANT
NDL HYPO 25X1 1.5 SAFETY (NEEDLE) ×1 IMPLANT
NEEDLE HYPO 25X1 1.5 SAFETY (NEEDLE) ×2 IMPLANT
PACK BASIC VI WITH GOWN DISP (CUSTOM PROCEDURE TRAY) ×2 IMPLANT
PENCIL SMOKE EVACUATOR (MISCELLANEOUS) IMPLANT
SPONGE T-LAP 18X18 ~~LOC~~+RFID (SPONGE) ×2 IMPLANT
STRIP CLOSURE SKIN 1/2X4 (GAUZE/BANDAGES/DRESSINGS) IMPLANT
SUT MNCRL AB 4-0 PS2 18 (SUTURE) ×2 IMPLANT
SUT VIC AB 2-0 SH 27 (SUTURE) ×2
SUT VIC AB 2-0 SH 27X BRD (SUTURE) ×1 IMPLANT
SUT VIC AB 3-0 SH 27 (SUTURE)
SUT VIC AB 3-0 SH 27XBRD (SUTURE) ×1 IMPLANT
SYR CONTROL 10ML LL (SYRINGE) ×2 IMPLANT
TOWEL OR 17X26 10 PK STRL BLUE (TOWEL DISPOSABLE) ×2 IMPLANT

## 2020-11-12 NOTE — Anesthesia Postprocedure Evaluation (Signed)
Anesthesia Post Note  Patient: [name]  Procedure(s) Performed: EXCISION LEFT MASS ABDOMINAL (Left: Abdomen)     Patient location during evaluation: PACU Anesthesia Type: General Level of consciousness: awake and alert Pain management: pain level controlled Vital Signs Assessment: post-procedure vital signs reviewed and stable Respiratory status: spontaneous breathing, nonlabored ventilation, respiratory function stable and patient connected to nasal cannula oxygen Cardiovascular status: blood pressure returned to baseline and stable Postop Assessment: no apparent nausea or vomiting Anesthetic complications: no   No notable events documented.  Last Vitals:  Vitals:   11/12/20 1430 11/12/20 1445  BP: 123/75 135/86  Pulse: 66 72  Resp: 12 14  Temp:  36.4 C  SpO2: 91% 93%    Last Pain:  Vitals:   11/12/20 1445  TempSrc:   PainSc: 2                  Effie Berkshire

## 2020-11-12 NOTE — Anesthesia Preprocedure Evaluation (Addendum)
Anesthesia Evaluation  Patient identified by MRN, date of birth, ID band Patient awake    Reviewed: Allergy & Precautions, NPO status , Patient's Chart, lab work & pertinent test results  Airway Mallampati: III  TM Distance: >3 FB Neck ROM: Full  Mouth opening: Limited Mouth Opening Comment: Small mouth opening Dental  (+) Teeth Intact, Dental Advisory Given   Pulmonary asthma , PE (PE last fall, eliquis- last dose 3d ago) Hardly ever uses inhaler  Elevated hemidiaphragm on CXR found when she had a URI a few years ago   Pulmonary exam normal breath sounds clear to auscultation       Cardiovascular (-) hypertension (145/86 in preop, per pt has been going up recently)Normal cardiovascular exam Rhythm:Regular Rate:Normal     Neuro/Psych PSYCHIATRIC DISORDERS Anxiety Depression negative neurological ROS     GI/Hepatic Neg liver ROS, GERD  Medicated and Controlled,  Endo/Other  Obesity BMI 38  Renal/GU negative Renal ROS  negative genitourinary   Musculoskeletal negative musculoskeletal ROS (+)   Abdominal (+) + obese,   Peds  Hematology negative hematology ROS (+) hct 42.2, plt 271   Anesthesia Other Findings Left subcutaneous abdominal wall mass  Reproductive/Obstetrics negative OB ROS                            Anesthesia Physical Anesthesia Plan  ASA: 3  Anesthesia Plan: General   Post-op Pain Management:    Induction: Intravenous  PONV Risk Score and Plan: 4 or greater and Ondansetron, Dexamethasone, Midazolam and Treatment may vary due to age or medical condition  Airway Management Planned: Oral ETT  Additional Equipment: None  Intra-op Plan:   Post-operative Plan: Extubation in OR  Informed Consent: I have reviewed the patients History and Physical, chart, labs and discussed the procedure including the risks, benefits and alternatives for the proposed anesthesia with the  patient or authorized representative who has indicated his/her understanding and acceptance.     Dental advisory given  Plan Discussed with: CRNA  Anesthesia Plan Comments:        Anesthesia Quick Evaluation

## 2020-11-12 NOTE — Op Note (Signed)
Preoperative diagnosis: left abdominal wall mass  Postoperative diagnosis: same   Procedure: excision of 4 cm subcutaneous mass  Surgeon: Gurney Maxin, M.D.  Asst: none  Anesthesia: LMA general anesthesia  Indications for procedure: [name] is a 65 y.o. year old female with symptoms of firmness in her left chest wall. She presents for excision.Marland Kitchen  Description of procedure: The patient was brought into the operative suite. Anesthesia was administered with General LMA anesthesia. WHO checklist was applied. The patient was then placed in supine position. The area was prepped and draped in the usual sterile fashion.  Next, marcaine was infused over the area. A transverse incision was made through the skin. Cautery was used to dissect through the subcutaneous tissue. Blunt dissection and cautery was used to dissect the mass free of surrounding attachments. 3-0 vicryl was used to suture the superficial point of the mass with a short stitch and the lateral point of the mass with a long stitch. The mass was removed in its entirety. The wound was irrigated. The incision was closed with 3-0 vicryl in interrupted fashion and the skin was closed with 4-0 monocryl in running subcuticular stitch. Dermabond was put in place for dressing. The patient awoke from anesthesia and brought to pacu in stable condition. All counts were correct. The patient tolerated the procedure well.   Findings: 4 cm mass with blue pigment, firm and fatty composition  Specimen: abdominal wall mass, short marks superficial, long marks lateral  Implant: none   Blood loss: 5 ml  Local anesthesia:  20 ml marcaine   Complications: none  Gurney Maxin, M.D. General, Bariatric, & Minimally Invasive Surgery [facility]

## 2020-11-12 NOTE — Transfer of Care (Signed)
Immediate Anesthesia Transfer of Care Note  Patient: [name]  Procedure(s) Performed: EXCISION LEFT MASS ABDOMINAL (Left: Abdomen)  Patient Location: PACU  Anesthesia Type:General  Level of Consciousness: awake, alert  and oriented  Airway & Oxygen Therapy: Patient Spontanous Breathing and Patient connected to face mask oxygen  Post-op Assessment: Report given to RN and Post -op Vital signs reviewed and stable  Post vital signs: Reviewed and stable  Last Vitals:  Vitals Value Taken Time  BP 117/59 11/12/20 1400  Temp 36.4 C 11/12/20 1400  Pulse 77 11/12/20 1402  Resp 18 11/12/20 1402  SpO2 100 % 11/12/20 1402  Vitals shown include unvalidated device data.  Last Pain:  Vitals:   11/12/20 1400  TempSrc:   PainSc: 0-No pain      Patients Stated Pain Goal: 4 (03/70/48 8891)  Complications: No notable events documented.

## 2020-11-12 NOTE — Anesthesia Procedure Notes (Signed)
Procedure Name: LMA Insertion Date/Time: 11/12/2020 1:27 PM Performed by: Genelle Bal, CRNA Pre-anesthesia Checklist: Patient identified, Emergency Drugs available, Suction available and Patient being monitored Patient Re-evaluated:Patient Re-evaluated prior to induction Oxygen Delivery Method: Circle system utilized Preoxygenation: Pre-oxygenation with 100% oxygen Induction Type: IV induction Ventilation: Mask ventilation without difficulty LMA: LMA inserted LMA Size: 4.0 Number of attempts: 1 Airway Equipment and Method: Bite block Placement Confirmation: positive ETCO2 Tube secured with: Tape Dental Injury: Teeth and Oropharynx as per pre-operative assessment

## 2020-11-14 ENCOUNTER — Encounter (HOSPITAL_COMMUNITY): Payer: Self-pay | Admitting: General Surgery

## 2020-11-15 LAB — SURGICAL PATHOLOGY

## 2020-12-07 ENCOUNTER — Telehealth: Payer: Self-pay

## 2020-12-07 NOTE — Telephone Encounter (Signed)
DOS 12/22/2020  TARSAL EXOSTECTOMY LT - 95284 PRP INJECTION LT - 0232T  [employer]  RECEIVED FAX FROM [facility] # [phone] APPROVED FOR CPT [phone]. AUTH DENIED FOR CPT 0232T WHICH WE ARE AWARE THAT THIS IS NOT COVERED.

## 2020-12-10 NOTE — H&P (Signed)
   KENNEDI LOCKMILLER is an 65 y.o. female.  HPI: 65 yo female with enlarging mass over her left abdominal wall  Past Medical History:  Diagnosis Date   Anxiety    Asthma 02/21/2018   Back pain    Cancer (HCC)    Depression    GERD (gastroesophageal reflux disease)    Hypertension     Past Surgical History:  Procedure Laterality Date   ABDOMINAL HYSTERECTOMY     ARTHROSCOPY WITH ANTERIOR CRUCIATE LIGAMENT (ACL) REPAIR WITH ANTERIOR TIBILIAS GRAFT     BACK SURGERY     EXCISION MASS ABDOMINAL Left 11/12/2020   Procedure: EXCISION LEFT MASS ABDOMINAL;  Surgeon: Kieth Brightly, Arta Bruce, MD;  Location: [facility];  Service: General;  Laterality: Left;   SHOULDER ARTHROSCOPY W/ ROTATOR CUFF REPAIR     SHOULDER OPEN ROTATOR CUFF REPAIR     TUBAL LIGATION      Family History  Problem Relation Age of Onset   Cancer Mother        lung   Cancer Father        lung   Hypertension Father    Diabetes Father    Non-Hodgkin's lymphoma Son    Colon cancer Maternal Aunt    Esophageal cancer Maternal Uncle    Rectal cancer Neg Hx    Stomach cancer Neg Hx     Social History:  reports that she has never smoked. She has been exposed to tobacco smoke. She has never used smokeless tobacco. She reports current alcohol use. She reports that she does not use drugs.  Allergies: No Known Allergies  Medications: I have reviewed the patient's current medications.  No results found for this or any previous visit (from the past 48 hour(s)).  No results found.  Review of Systems  Constitutional:  Negative for chills and fever.  HENT:  Negative for hearing loss.   Eyes:  Negative for blurred vision and double vision.  Respiratory:  Negative for cough and hemoptysis.   Cardiovascular:  Negative for chest pain and palpitations.  Gastrointestinal:  Negative for abdominal pain, nausea and vomiting.  Genitourinary:  Negative for dysuria and urgency.  Musculoskeletal:  Negative for myalgias and neck pain.   Skin:  Negative for itching and rash.  Neurological:  Negative for dizziness, tingling and headaches.  Endo/Heme/Allergies:  Does not bruise/bleed easily.  Psychiatric/Behavioral:  Negative for depression and suicidal ideas.    PE Blood pressure 135/86, pulse 72, temperature 97.6 F (36.4 C), resp. rate 14, height '5\' 3"'$  (1.6 m), weight 97.1 kg, SpO2 93 %. Constitutional: NAD; conversant; no deformities Eyes: Moist conjunctiva; no lid lag; anicteric; PERRL Neck: Trachea midline; no thyromegaly Lungs: Normal respiratory effort; no tactile fremitus CV: RRR; no palpable thrills; no pitting edema GI: Abd left sided subcutaneous mass; no palpable hepatosplenomegaly MSK: Normal gait; no clubbing/cyanosis Psychiatric: Appropriate affect; alert and oriented x3 Lymphatic: No palpable cervical or axillary lymphadenopathy Skin: No major subcutaneous nodules. Warm and dry   Assessment/Plan: 65 yo female with left sided abdominal mass -excision of subcutaneous mass  Arta Bruce Mackenzye Mackel 12/10/2020, 1:59 PM

## 2020-12-17 ENCOUNTER — Encounter: Payer: Self-pay | Admitting: Podiatry

## 2020-12-17 ENCOUNTER — Other Ambulatory Visit: Payer: Self-pay | Admitting: Podiatry

## 2020-12-17 MED ORDER — CEPHALEXIN 500 MG PO CAPS: 500.0000 mg | ORAL_CAPSULE | Freq: Three times a day (TID) | ORAL | 0 refills | Status: DC

## 2020-12-20 ENCOUNTER — Encounter: Payer: Self-pay | Admitting: Podiatry

## 2020-12-20 ENCOUNTER — Ambulatory Visit: Payer: PPO | Admitting: Podiatry

## 2020-12-20 ENCOUNTER — Inpatient Hospital Stay (HOSPITAL_BASED_OUTPATIENT_CLINIC_OR_DEPARTMENT_OTHER): Payer: PPO | Admitting: Hematology & Oncology

## 2020-12-20 ENCOUNTER — Inpatient Hospital Stay: Payer: PPO | Attending: Hematology & Oncology

## 2020-12-20 ENCOUNTER — Encounter: Payer: Self-pay | Admitting: Hematology & Oncology

## 2020-12-20 ENCOUNTER — Other Ambulatory Visit: Payer: Self-pay

## 2020-12-20 VITALS — BP 135/94 | HR 84 | Temp 98.6°F | Resp 18 | Wt 212.0 lb

## 2020-12-20 DIAGNOSIS — I2699 Other pulmonary embolism without acute cor pulmonale: Secondary | ICD-10-CM | POA: Diagnosis not present

## 2020-12-20 DIAGNOSIS — Z86711 Personal history of pulmonary embolism: Secondary | ICD-10-CM | POA: Diagnosis not present

## 2020-12-20 DIAGNOSIS — T148XXA Other injury of unspecified body region, initial encounter: Secondary | ICD-10-CM | POA: Diagnosis not present

## 2020-12-20 DIAGNOSIS — Z7901 Long term (current) use of anticoagulants: Secondary | ICD-10-CM | POA: Insufficient documentation

## 2020-12-20 LAB — CMP (CANCER CENTER ONLY)
ALT: 23 U/L (ref 0–44)
AST: 36 U/L (ref 15–41)
Albumin: 3.8 g/dL (ref 3.5–5.0)
Alkaline Phosphatase: 61 U/L (ref 38–126)
Anion gap: 8 (ref 5–15)
BUN: 11 mg/dL (ref 8–23)
CO2: 29 mmol/L (ref 22–32)
Calcium: 9.3 mg/dL (ref 8.9–10.3)
Chloride: 101 mmol/L (ref 98–111)
Creatinine: 0.86 mg/dL (ref 0.44–1.00)
GFR, Estimated: >60 mL/min (ref >60)
Glucose, Bld: 83 mg/dL (ref 70–99)
Potassium: 4.4 mmol/L (ref 3.5–5.1)
Sodium: 138 mmol/L (ref 135–145)
Total Bilirubin: 0.7 mg/dL (ref 0.3–1.2)
Total Protein: 6.7 g/dL (ref 6.5–8.1)

## 2020-12-20 LAB — CBC WITH DIFFERENTIAL (CANCER CENTER ONLY)
Abs Immature Granulocytes: 0.02 10*3/uL (ref 0.00–0.07)
Basophils Absolute: 0 10*3/uL (ref 0.0–0.1)
Basophils Relative: 0 %
Eosinophils Absolute: 0 10*3/uL (ref 0.0–0.5)
Eosinophils Relative: 1 %
HCT: 44.3 % (ref 36.0–46.0)
Hemoglobin: 14.7 g/dL (ref 12.0–15.0)
Immature Granulocytes: 0 %
Lymphocytes Relative: 45 %
Lymphs Abs: 3 10*3/uL (ref 0.7–4.0)
MCH: 33.9 pg (ref 26.0–34.0)
MCHC: 33.2 g/dL (ref 30.0–36.0)
MCV: 102.1 fL — ABNORMAL HIGH (ref 80.0–100.0)
Monocytes Absolute: 0.5 10*3/uL (ref 0.1–1.0)
Monocytes Relative: 8 %
Neutro Abs: 3.1 10*3/uL (ref 1.7–7.7)
Neutrophils Relative %: 46 %
Platelet Count: 240 10*3/uL (ref 150–400)
RBC: 4.34 MIL/uL (ref 3.87–5.11)
RDW: 14.4 % (ref 11.5–15.5)
WBC Count: 6.7 10*3/uL (ref 4.0–10.5)
nRBC: 0 % (ref 0.0–0.2)

## 2020-12-20 MED ORDER — OXYCODONE-ACETAMINOPHEN 5-325 MG PO TABS: 1.0000 | ORAL_TABLET | Freq: Four times a day (QID) | ORAL | 0 refills | Status: DC | PRN

## 2020-12-20 MED ORDER — ONDANSETRON HCL 4 MG PO TABS: 4.0000 mg | ORAL_TABLET | Freq: Three times a day (TID) | ORAL | 0 refills | Status: DC | PRN

## 2020-12-20 NOTE — Progress Notes (Signed)
Hematology and Oncology Follow Up Visit  [name] [id number] [birth year] 65 y.o. 12/20/2020   Principle Diagnosis:  Pulmonary embolism -- dx 'ed in 01/2020   Past Work-up: Hyper coag panel negative   Current Therapy: Eliquis 2.5 mg PO BID -- complete 1 yr of therapy in 07/2021   Interim History:  [name] is here today with her husband for follow-up.  They are both incredibly tanned.  They just got [place] from the ocean.  They are down to [name].  She is post have surgery on her left foot on Wednesday.  She stopped the Eliquis yesterday.  She actually had some surgery done for a chest wall mass.  This was back on 11/12/2020.  The pathology report showed this to be a cavernous hemangioma.  She had no problems with bleeding after surgery.  She is doing well otherwise.  There is no bleeding.  She has had no problems with cough or shortness of breath.  There is no chest wall pain.  She has had no issues with COVID.  She apparently developed a wound on the left lower leg.  She is on some antibiotics.  Hopefully, the surgeon would still operate on her foot.  Her labs look fantastic today.  I will see any issues with her having surgery given her lab work looking okay.  She has had no fever.  Her blood pressure is on the higher side.  Overall, I would say performance status is ECOG 1.    Medications:  Allergies as of 12/20/2020   No Known Allergies      Medication List        Accurate as of December 20, 2020  3:14 PM. If you have any questions, ask your nurse or doctor.          STOP taking these medications    ibuprofen 800 MG tablet Commonly known as: ADVIL Stopped by: Karina Napoleon, MD       TAKE these medications    albuterol 108 (90 Base) MCG/ACT inhaler Commonly known as: VENTOLIN HFA Inhale 2 puffs into the lungs every 6 (six) hours as needed for wheezing or shortness of breath.   buPROPion 300 MG 24 hr tablet Commonly known as: WELLBUTRIN XL Take 1  tablet (300 mg total) by mouth daily.   CASCARA SAGRADA PO Take 1 capsule by mouth at bedtime.   celecoxib 100 MG capsule Commonly known as: CELEBREX TAKE ONE TO TWO CAPSULES BY MOUTH TWICE A DAY   cephALEXin 500 MG capsule Commonly known as: KEFLEX Take 1 capsule (500 mg total) by mouth 3 (three) times daily.   DULoxetine 60 MG capsule Commonly known as: CYMBALTA Take 60 mg by mouth daily.   Eliquis 5 MG Tabs tablet Generic drug: apixaban Take 2.5 mg by mouth 2 (two) times daily.   estradiol 1 MG tablet Commonly known as: ESTRACE Take 1 tablet (1 mg total) by mouth daily.   loratadine 10 MG tablet Commonly known as: CLARITIN Take 1 tablet (10 mg total) by mouth daily.   omeprazole 40 MG capsule Commonly known as: PRILOSEC TAKE 1 CAPSULE BY MOUTH IN THE MORNING AND AT BEDTIME.   pregabalin 50 MG capsule Commonly known as: Lyrica Take 1 capsule (50 mg total) by mouth 3 (three) times daily.   Vitamin D 125 MCG (5000 UT) Caps Take 5,000 Units by mouth daily.   vitamin E 1000 UNIT capsule Take 1,000 Units by mouth daily.  Allergies: No Known Allergies  Past Medical History, Surgical history, Social history, and Family History were reviewed and updated.  Review of Systems: Review of Systems  Constitutional: Negative.   HENT: Negative.    Eyes: Negative.   Respiratory: Negative.    Cardiovascular: Negative.   Gastrointestinal: Negative.   Genitourinary: Negative.   Musculoskeletal: Negative.   Skin: Negative.   Neurological: Negative.   Endo/Heme/Allergies: Negative.   Psychiatric/Behavioral: Negative.      Physical Exam:  weight is 212 lb (96.2 kg). Her oral temperature is 98.6 F (37 C). Her blood pressure is 135/94 (abnormal) and her pulse is 84. Her respiration is 18 and oxygen saturation is 99%.   Wt Readings from Last 3 Encounters:  12/20/20 212 lb (96.2 kg)  11/12/20 214 lb (97.1 kg)  11/05/20 214 lb (97.1 kg)    Physical  Exam Vitals reviewed.  HENT:     Head: Normocephalic and atraumatic.  Eyes:     Pupils: Pupils are equal, round, and reactive to light.  Cardiovascular:     Rate and Rhythm: Normal rate and regular rhythm.     Heart sounds: Normal heart sounds.  Pulmonary:     Effort: Pulmonary effort is normal.     Breath sounds: Normal breath sounds.  Abdominal:     General: Bowel sounds are normal.     Palpations: Abdomen is soft.  Musculoskeletal:        General: No tenderness or deformity. Normal range of motion.     Cervical back: Normal range of motion.  Lymphadenopathy:     Cervical: No cervical adenopathy.  Skin:    General: Skin is warm and dry.     Findings: No erythema or rash.  Neurological:     Mental Status: She is alert and oriented to person, place, and time.  Psychiatric:        Behavior: Behavior normal.        Thought Content: Thought content normal.        Judgment: Judgment normal.    Lab Results  Component Value Date   WBC 6.7 12/20/2020   HGB 14.7 12/20/2020   HCT 44.3 12/20/2020   MCV 102.1 (H) 12/20/2020   PLT 240 12/20/2020   Lab Results  Component Value Date   FERRITIN 439 (H) 04/20/2020   IRON 88 04/20/2020   TIBC 223 (L) 04/20/2020   UIBC 135 04/20/2020   IRONPCTSAT 39 04/20/2020   Lab Results  Component Value Date   RETICCTPCT 2.2 04/20/2020   RBC 4.34 12/20/2020   No results found for: KPAFRELGTCHN, LAMBDASER, KAPLAMBRATIO No results found for: IGGSERUM, IGA, IGMSERUM No results found for: Odetta Pink, SPEI   Chemistry      Component Value Date/Time   NA 138 12/20/2020 1407   K 4.4 12/20/2020 1407   CL 101 12/20/2020 1407   CO2 29 12/20/2020 1407   BUN 11 12/20/2020 1407   CREATININE 0.86 12/20/2020 1407   CREATININE 0.78 02/09/2020 1415      Component Value Date/Time   CALCIUM 9.3 12/20/2020 1407   ALKPHOS 61 12/20/2020 1407   AST 36 12/20/2020 1407   ALT 23 12/20/2020 1407    BILITOT 0.7 12/20/2020 1407       Impression and Plan: [name] is a very pleasant 65 yo caucasian female with recent diagnosis of small right upper lobe pulmonary embolus.  This appears to be idiopathic.  She is on maintenance Eliquis right now.  I would keep her on maintenance Eliquis until March of next year.  She will restart the Eliquis on Friday.  This will be 2 days after her surgery.  She will be off her leg for several weeks.  As such, we have to make sure we maintain an aggressive posture with respect to anticoagulation.  I would like to see her back probably in December.  I want to make sure that when she comes back she does not on crutches or with a brace or boot.     Karina Napoleon, MD 8/1/20223:14 PM

## 2020-12-21 ENCOUNTER — Telehealth: Payer: Self-pay

## 2020-12-22 DIAGNOSIS — M7752 Other enthesopathy of left foot: Secondary | ICD-10-CM | POA: Diagnosis not present

## 2020-12-22 DIAGNOSIS — M659 Synovitis and tenosynovitis, unspecified: Secondary | ICD-10-CM | POA: Diagnosis not present

## 2020-12-22 DIAGNOSIS — M85872 Other specified disorders of bone density and structure, left ankle and foot: Secondary | ICD-10-CM | POA: Diagnosis not present

## 2020-12-23 NOTE — Progress Notes (Signed)
Subjective: 65 year old female presents the office today for evaluation of a cut that she got on the left leg while at the beach.  She sent a message and started on doxycycline she presents today prior to surgery to make sure there is no infection.  Denies any fevers or chills.  No drainage.  No other concerns.   Objective: AAO x3, NAD DP/PT pulses palpable bilaterally, CRT less than 3 seconds Just proximal to ankle area is 2 superficial areas of skin breakdown and scab is formed.  There is no drainage or pus.  There is a mild rim of erythema but is no ascending cellulitis likely more from inflammation as opposed to infection. No open lesions or pre-ulcerative lesions.  No pain with calf compression, swelling, warmth, erythema  Assessment: 65 year old female abrasion left leg  Plan: -All treatment options discussed with the patient including all alternatives, risks, complications.  -No obvious signs of infection.  More inflammation.  However with the finished course of doxycycline.  I think she will be fine for surgery on Wednesday but there is any changes let me know. -Also as an aside I previously discussed the case with the group and will proceed as scheduled. -Patient encouraged to call the office with any questions, concerns, change in symptoms.   Trula Slade DPM

## 2020-12-27 ENCOUNTER — Other Ambulatory Visit: Payer: Self-pay

## 2020-12-27 ENCOUNTER — Ambulatory Visit (INDEPENDENT_AMBULATORY_CARE_PROVIDER_SITE_OTHER): Payer: PPO

## 2020-12-27 ENCOUNTER — Ambulatory Visit (INDEPENDENT_AMBULATORY_CARE_PROVIDER_SITE_OTHER): Payer: PPO | Admitting: Podiatry

## 2020-12-27 VITALS — Temp 97.5°F

## 2020-12-27 DIAGNOSIS — Z9889 Other specified postprocedural states: Secondary | ICD-10-CM

## 2020-12-27 DIAGNOSIS — M19072 Primary osteoarthritis, left ankle and foot: Secondary | ICD-10-CM

## 2020-12-27 DIAGNOSIS — M7752 Other enthesopathy of left foot: Secondary | ICD-10-CM

## 2020-12-30 ENCOUNTER — Encounter: Payer: Self-pay | Admitting: Podiatry

## 2020-12-30 NOTE — Progress Notes (Signed)
Subjective: [name] is a 65 y.o. is seen today in office s/p left foot bone spur excision, PRP injection preformed on 12/22/2020.  States that she is doing well and no longer taking any pain medication outside of the first day.  She A cam boot.  Denies any systemic complaints such as fevers, chills, nausea, vomiting. No calf pain, chest pain, shortness of breath.   Objective: General: No acute distress, AAOx3  DP/PT pulses palpable 2/4, CRT < 3 sec to all digits.  Protective sensation intact. Motor function intact.  LEFT foot: Incision is well coapted without any evidence of dehiscence and sutures are intact. There is no surrounding erythema, ascending cellulitis, fluctuance, crepitus, malodor, drainage/purulence. There is mild edema around the surgical site. There is no significant pain along the surgical site.  No other areas of tenderness to bilateral lower extremities.  No other open lesions or pre-ulcerative lesions.  No pain with calf compression, swelling, warmth, erythema.   Assessment and Plan:  Status post left foot bone spur excision, PRP injection, doing well with no complications   -Treatment options discussed including all alternatives, risks, and complications -X-rays obtained reviewed.  No evidence of acute fracture.  Status post bone spur, ossicle excision. -Small amount of antibiotic ointment was applied followed by dressing.  Keep dressing clean, dry, intact -Ice/elevation -Pain medication as needed. -Monitor for any clinical signs or symptoms of infection and DVT/PE and directed to call the office immediately should any occur or go to the ER. -Follow-up as scheduled for possible suture removal or sooner if any problems arise. In the meantime, encouraged to call the office with any questions, concerns, change in symptoms.   Celesta Gentile, DPM

## 2020-12-31 DIAGNOSIS — M7989 Other specified soft tissue disorders: Secondary | ICD-10-CM | POA: Diagnosis not present

## 2020-12-31 DIAGNOSIS — Z9889 Other specified postprocedural states: Secondary | ICD-10-CM | POA: Diagnosis not present

## 2021-01-06 ENCOUNTER — Ambulatory Visit (INDEPENDENT_AMBULATORY_CARE_PROVIDER_SITE_OTHER): Payer: PPO | Admitting: Podiatry

## 2021-01-06 ENCOUNTER — Other Ambulatory Visit: Payer: Self-pay

## 2021-01-06 DIAGNOSIS — M19072 Primary osteoarthritis, left ankle and foot: Secondary | ICD-10-CM

## 2021-01-06 DIAGNOSIS — Z9889 Other specified postprocedural states: Secondary | ICD-10-CM

## 2021-01-08 NOTE — Progress Notes (Signed)
Subjective: [name] is a 65 y.o. is seen today in office s/p left foot bone spur excision, PRP injection preformed on 12/22/2020.  She states that she is doing well and she has no concerns today.  She presents today for suture removal.  Denies any fevers or chills.  She has no other concerns.  Objective: General: No acute distress, AAOx3 -husband present DP/PT pulses palpable 2/4, CRT < 3 sec to all digits.  Protective sensation intact. Motor function intact.  LEFT foot: Incision is well coapted without any evidence of dehiscence and sutures are intact.  There is no significant surrounding erythema there is no ascending cellulitis there is no drainage or pus.  Minimal swelling.  No significant pain on surgical site.  No pain with ankle ankle joint range of motion noted. No other open lesions or pre-ulcerative lesions.  No pain with calf compression, swelling, warmth, erythema.   Assessment and Plan:  Status post left foot bone spur excision, PRP injection, doing well with no complications   -Treatment options discussed including all alternatives, risks, and complications -I remove the sutures today without any complications the incision is well coapted.  I applied Steri-Strips for reinforcement.  A small amount of antibiotic ointment and bandage applied.  Discussed she can start to wash foot with soap and water tomorrow.  Continue in cam boot, elevation. -Reviewed the pathology results with her.  Currently asymptomatic to the ankle but if needed we can get an MRI in the future.  Return in about 2 weeks (around 01/20/2021).  Trula Slade DPM

## 2021-01-13 ENCOUNTER — Telehealth: Payer: Self-pay | Admitting: Lab

## 2021-01-13 NOTE — Chronic Care Management (AMB) (Signed)
  Chronic Care Management   Note  01/13/2021 Name: SHAKYRAH SVETLIK MRN: [id number] DOB: 1955-12-22  YASURI POMERENKE is a 65 y.o. year old female who is a primary care patient of Emeterio Reeve, DO. I reached out to [employer] by phone today in response to a referral sent by Ms. Rema Fendt Abdullah's PCP, Emeterio Reeve, DO.   Ms. Valois was given information about Chronic Care Management services today including:  CCM service includes personalized support from designated clinical staff supervised by her physician, including individualized plan of care and coordination with other care providers 24/7 contact phone numbers for assistance for urgent and routine care needs. Service will only be billed when office clinical staff spend 20 minutes or more in a month to coordinate care. Only one practitioner may furnish and bill the service in a calendar month. The patient may stop CCM services at any time (effective at the end of the month) by phone call to the office staff.   Patient agreed to services and verbal consent obtained.   Follow up plan:   [place]

## 2021-01-18 ENCOUNTER — Other Ambulatory Visit (HOSPITAL_BASED_OUTPATIENT_CLINIC_OR_DEPARTMENT_OTHER): Payer: Self-pay | Admitting: Osteopathic Medicine

## 2021-01-18 DIAGNOSIS — Z1231 Encounter for screening mammogram for malignant neoplasm of breast: Secondary | ICD-10-CM

## 2021-01-20 ENCOUNTER — Ambulatory Visit (INDEPENDENT_AMBULATORY_CARE_PROVIDER_SITE_OTHER): Payer: PPO | Admitting: Podiatry

## 2021-01-20 ENCOUNTER — Ambulatory Visit (INDEPENDENT_AMBULATORY_CARE_PROVIDER_SITE_OTHER): Payer: PPO

## 2021-01-20 ENCOUNTER — Other Ambulatory Visit: Payer: Self-pay

## 2021-01-20 DIAGNOSIS — Z9889 Other specified postprocedural states: Secondary | ICD-10-CM

## 2021-01-20 DIAGNOSIS — M778 Other enthesopathies, not elsewhere classified: Secondary | ICD-10-CM

## 2021-01-20 DIAGNOSIS — M19072 Primary osteoarthritis, left ankle and foot: Secondary | ICD-10-CM

## 2021-01-20 MED ORDER — CEPHALEXIN 500 MG PO CAPS: 500.0000 mg | ORAL_CAPSULE | Freq: Four times a day (QID) | ORAL | 0 refills | Status: DC

## 2021-01-20 MED ORDER — MUPIROCIN 2 % EX OINT: 1.0000 "application " | TOPICAL_OINTMENT | Freq: Two times a day (BID) | CUTANEOUS | 2 refills | Status: DC

## 2021-01-20 NOTE — Progress Notes (Signed)
Subjective: [name] is a 65 y.o. is seen today in office s/p left foot bone spur excision, PRP injection preformed on 12/22/2020.  She states that yesterday the incision was looking good but when she woke up this morning she thinks she may have scratched her foot she denies any redness around the incision.  No drainage.  Currently denies any fevers or chills.  No other concerns today.  Objective: General: No acute distress, AAOx3 -husband present DP/PT pulses palpable 2/4, CRT < 3 sec to all digits.  Protective sensation intact. Motor function intact.  LEFT foot: Incision is well coapted without any evidence of dehiscence and scab is present.  There is some new surrounding erythema that extends proximally 1 cm from the incision.  There is no fluctuation or crepitation but there is mild swelling.  No drainage or possibly malodor.  No ascending cellulitis. No other open lesions or pre-ulcerative lesions.  No pain with calf compression, swelling, warmth, erythema.   Assessment and Plan:  Status post left foot bone spur excision, PRP injection  -Treatment options discussed including all alternatives, risks, and complications -She is apparent new redness today with localized infection.  She thinks that she may days and that in the middle the night which caused this.  I will go ahead and start antibiotics. Keflex ordered.  Encouraged ice elevation and remain in offloading boot.  Limit weightbearing. -Monitor for any clinical signs or symptoms of infection and directed to call the office immediately should any occur or go to the ER.  Return in about 1 week (around 01/27/2021).  Trula Slade DPM

## 2021-01-31 ENCOUNTER — Ambulatory Visit (INDEPENDENT_AMBULATORY_CARE_PROVIDER_SITE_OTHER): Payer: PPO | Admitting: Podiatry

## 2021-01-31 ENCOUNTER — Encounter: Payer: Self-pay | Admitting: Podiatry

## 2021-01-31 ENCOUNTER — Other Ambulatory Visit: Payer: Self-pay

## 2021-01-31 DIAGNOSIS — Z9889 Other specified postprocedural states: Secondary | ICD-10-CM

## 2021-01-31 DIAGNOSIS — M19072 Primary osteoarthritis, left ankle and foot: Secondary | ICD-10-CM

## 2021-02-01 ENCOUNTER — Encounter (HOSPITAL_BASED_OUTPATIENT_CLINIC_OR_DEPARTMENT_OTHER): Payer: Self-pay

## 2021-02-01 ENCOUNTER — Ambulatory Visit (HOSPITAL_BASED_OUTPATIENT_CLINIC_OR_DEPARTMENT_OTHER)
Admission: RE | Admit: 2021-02-01 | Discharge: 2021-02-01 | Disposition: A | Discharge status: Home / Self Care | Payer: PPO | Source: Ambulatory Visit | Attending: Osteopathic Medicine | Admitting: Osteopathic Medicine

## 2021-02-01 DIAGNOSIS — Z1231 Encounter for screening mammogram for malignant neoplasm of breast: Secondary | ICD-10-CM | POA: Diagnosis not present

## 2021-02-02 NOTE — Progress Notes (Signed)
Subjective: [name] is a 65 y.o. is seen today in office s/p left foot bone spur excision, PRP injection preformed on 12/22/2020.  She states that she is due to the antibiotics but overall her foot is doing much better.  She denies any drainage or pus.  The redness is almost completely resolved.  No fevers or chills.  She still in the cam boot but having to go back into regular shoe today.  She has no other concerns today.   Objective: General: No acute distress, AAOx3 -husband present DP/PT pulses palpable 2/4, CRT < 3 sec to all digits.  Protective sensation intact. Motor function intact.  LEFT foot: Incision is well coapted without any evidence of dehiscence and scab is present.  Erythema that was present previously has resolved.  There is no drainage or pus.  No pain.  Minimal edema.  Swelling appears to be improved.  There is no fluctuance or crepitation.  There is no malodor. No other open lesions or pre-ulcerative lesions.  No pain with calf compression, swelling, warmth, erythema.   Assessment and Plan:  Status post left foot bone spur excision, PRP injection  -Treatment options discussed including all alternatives, risks, and complications -From infection standpoint she is doing much better and almost completely resolved.  Finish the course of antibiotics.  I would hold off on the back into her shoe until the scab is come off and the scar is well formed.  Discussed daily dressing changes.  Ice elevation as well as compression help with any postoperative edema. -Monitor for any clinical signs or symptoms of infection and directed to call the office immediately should any occur or go to the ER.   Trula Slade DPM

## 2021-02-09 ENCOUNTER — Other Ambulatory Visit: Payer: Self-pay | Admitting: Osteopathic Medicine

## 2021-02-10 NOTE — Telephone Encounter (Signed)
Okay to fill? This is from a historical provider.

## 2021-02-14 ENCOUNTER — Ambulatory Visit (INDEPENDENT_AMBULATORY_CARE_PROVIDER_SITE_OTHER): Payer: PPO

## 2021-02-14 ENCOUNTER — Ambulatory Visit (INDEPENDENT_AMBULATORY_CARE_PROVIDER_SITE_OTHER): Payer: PPO | Admitting: Podiatry

## 2021-02-14 ENCOUNTER — Other Ambulatory Visit: Payer: Self-pay

## 2021-02-14 DIAGNOSIS — Z9889 Other specified postprocedural states: Secondary | ICD-10-CM

## 2021-02-14 DIAGNOSIS — M1220 Villonodular synovitis (pigmented), unspecified site: Secondary | ICD-10-CM

## 2021-02-14 DIAGNOSIS — M19072 Primary osteoarthritis, left ankle and foot: Secondary | ICD-10-CM

## 2021-02-18 ENCOUNTER — Encounter: Payer: Self-pay | Admitting: Podiatry

## 2021-02-18 NOTE — Progress Notes (Signed)
Subjective: [name] is a 65 y.o. is seen today in office s/p left foot bone spur excision, PRP injection preformed on 12/22/2020.  She states that her foot is doing much better she is ready get back into regular shoe.  She denies any redness or swelling or any drainage.  She has noted the fluid to the ankle that was drained during surgery and started to come back.  No fevers or chills.  Objective: General: No acute distress, AAOx3 -husband present DP/PT pulses palpable 2/4, CRT < 3 sec to all digits.  Protective sensation intact. Motor function intact.  LEFT foot: Incision is well coapted without any evidence of dehiscence and minimal scab is still present.  There is no surrounding erythema, ascending cellulitis.  No fluctuation crepitation.  No malodor.  No drainage or pus noted.  No pain on exam.  On the the anterior aspect of the lateral portion of the ankle spinal fluid collection was noted.  There is no erythema or warmth associate with this.  This is on the same area that was drained during surgery. No pain with calf compression, swelling, warmth, erythema.   Assessment and Plan:  Status post left foot bone spur excision, PRP injection  -Treatment options discussed including all alternatives, risks, and complications -From surgical standpoint on the left foot she is doing well.  She can start to transition to regular shoe as tolerated.  Keep a bandage on the incision as well as avoiding any shoe that puts pressure.  Monitor for any signs or symptoms of infection. -Regards to left ankle x-rays were obtained and reviewed.  No evidence of acute fracture, osteomyelitis.  Given the reoccurrence of the foot on the order MRI.  Return for after MRI.  Trula Slade DPM

## 2021-02-21 ENCOUNTER — Telehealth: Payer: Self-pay | Admitting: Pharmacist

## 2021-02-21 NOTE — Chronic Care Management (AMB) (Signed)
Chronic Care Management Pharmacy Assistant   Name: MIXTLI RENO  MRN: [id number] DOB: 1955-07-08  MARCELA ALATORRE is an 65 y.o. year old female who presents for her initial CCM visit with the clinical pharmacist.  Recent office visits:  N/A  Recent consult visits:  02/14/21 Celesta Gentile DPM Podiatry- pt was seen for Status post left foot bone spur excision, PRP injection. Xray and MRI were ordered. F/u after MRI.  01/20/21 Celesta Gentile DPM-  pt was seen for Status post left foot bone spur excision, PRP injection. An Xray was done and pt was started on Mupirocin 2% BID and Cephalexin 500 mg qid. Follow up in 1 week.  12/20/20 Celesta Gentile DPM- pt was seen for a skin abrasion was started on Zofran HCI 4 mg q8h PRN and oxycodone- acetaminophen 5-325 mg 1-2 tablets q6h. No follow up noted.  12/20/20 Burney Gauze MD Oncology- pt was seen for hx of pulmonary embolism. Labs were ordered for the cancer center and provider discontinued pt's Ibuprofen 800 mg. Follow up 4 months  10/12/20 Celesta Gentile DPM- pt was seen for Capsulitis of left foot. Xray of left foot was ordered and patient opted in for surgery.  Hospital visits:  1 Medication Reconciliation was completed by comparing discharge summary, patient's EMR and Pharmacy list, and upon discussion with patient.  Admitted to the hospital on 11/12/20 due to general surgery. Discharge date was 11/12/20. Discharged from [place]?Medications Started at [facility] Discharge:?? -started ibuprofen   Medication Changes at Hospital Discharge: -Changed  celecoxib  Eliquis  loratadine  omeprazole   Medications Discontinued at Hospital Discharge: -Stopped  dicyclomine 20 MG tablet  fexofenadine 180 MG tablet  Medications that remain the same after Hospital Discharge:??  -All other medications will remain the same.    2 Admitted to the hospital on 10/17/20 due to Cough. Discharge date was 10/17/20. Discharged  from [facility]?Medications Started at [facility] Discharge:?? -started benzonatate  cefdinir  fexofenadine  predniSONE  Medication Changes at Hospital Discharge: -Changed none  Medications Discontinued at Hospital Discharge: -Stopped none  Medications that remain the same after Hospital Discharge:??  -All other medications will remain the same.   Medications: Outpatient Encounter Medications as of 02/21/2021  Medication Sig Note   albuterol (VENTOLIN HFA) 108 (90 Base) MCG/ACT inhaler Inhale 2 puffs into the lungs every 6 (six) hours as needed for wheezing or shortness of breath.    apixaban (ELIQUIS) 5 MG TABS tablet Take 2.5 mg by mouth 2 (two) times daily. 11/12/2020: On hold as of 6.22.22   buPROPion (WELLBUTRIN XL) 300 MG 24 hr tablet Take 1 tablet (300 mg total) by mouth daily.    buPROPion (WELLBUTRIN XL) 300 MG 24 hr tablet Take 1 tablet by mouth daily.    CASCARA SAGRADA PO Take 1 capsule by mouth at bedtime.    celecoxib (CELEBREX) 100 MG capsule TAKE ONE TO TWO CAPSULES BY MOUTH TWICE A DAY    cephALEXin (KEFLEX) 500 MG capsule Take 1 capsule (500 mg total) by mouth 4 (four) times daily.    Cholecalciferol (VITAMIN D) 125 MCG (5000 UT) CAPS Take 5,000 Units by mouth daily.    DULoxetine (CYMBALTA) 60 MG capsule Take 1 capsule (60 mg total) by mouth daily. Needs to establish with new PCP for future refills.    estradiol (ESTRACE) 1 MG tablet Take 1 tablet (1 mg total) by mouth daily.  loratadine (CLARITIN) 10 MG tablet Take 1 tablet (10 mg total) by mouth daily.    mupirocin ointment (BACTROBAN) 2 % Apply 1 application topically 2 (two) times daily.    omeprazole (PRILOSEC) 40 MG capsule TAKE 1 CAPSULE BY MOUTH IN THE MORNING AND AT BEDTIME.    ondansetron (ZOFRAN) 4 MG tablet Take 1 tablet (4 mg total) by mouth every 8 (eight) hours as needed for nausea or vomiting.    oxyCODONE-acetaminophen (PERCOCET/ROXICET) 5-325 MG tablet Take 1-2  tablets by mouth every 6 (six) hours as needed for severe pain.    pregabalin (LYRICA) 50 MG capsule Take 1 capsule (50 mg total) by mouth 3 (three) times daily.    pregabalin (LYRICA) 50 MG capsule Take by mouth.    vitamin E 1000 UNIT capsule Take 1,000 Units by mouth daily.    No facility-administered encounter medications on file as of 02/21/2021.    Current Medication List  albuterol 108 (90 Base) MCG/ACT inhaler last filled 06/10/20 20 DS apixaban (ELIQUIS) 5 MG TABS tablet last filled 02/15/21 30 DS buPROPion (WELLBUTRIN XL) 300 MG 24 hr tablet last filled CASCARA SAGRADA PO celecoxib (CELEBREX) 100 MG capsule last filled 03/18/20 90 DS cephALEXin (KEFLEX) 500 MG capsule last filled 01/20/21 10 DS Cholecalciferol (VITAMIN D) 125 MCG (5000 UT) CAPS DULoxetine (CYMBALTA) 60 MG capsule last filled 02/11/21 90 DS estradiol (ESTRACE) 1 MG tablet last filled 05/07/20 90 DS loratadine (CLARITIN) 10 MG tablet last filled 06/14/20 90 DS mupirocin ointment (BACTROBAN) 2 % last filled 01/20/21 30 DS omeprazole (PRILOSEC) 40 MG capsule last filled 09/13/20 90 DS ondansetron (ZOFRAN) 4 MG tablet last filled 12/20/20 10 DS oxyCODONE-acet. (PERCOCET) 5-325 MG tablet last filled 12/20/20 3 DS pregabalin (LYRICA) 50 MG capsule last filled 08/02/2020 90 DS  [place] Clinical Pharmacist Assistant [phone]

## 2021-02-25 ENCOUNTER — Ambulatory Visit (INDEPENDENT_AMBULATORY_CARE_PROVIDER_SITE_OTHER): Payer: PPO | Admitting: Pharmacist

## 2021-02-25 ENCOUNTER — Other Ambulatory Visit: Payer: Self-pay

## 2021-02-25 ENCOUNTER — Ambulatory Visit
Admission: RE | Admit: 2021-02-25 | Discharge: 2021-02-25 | Disposition: A | Discharge status: Home / Self Care | Payer: PPO | Source: Ambulatory Visit | Attending: Podiatry | Admitting: Podiatry

## 2021-02-25 DIAGNOSIS — S86312A Strain of muscle(s) and tendon(s) of peroneal muscle group at lower leg level, left leg, initial encounter: Secondary | ICD-10-CM | POA: Diagnosis not present

## 2021-02-25 DIAGNOSIS — Z86711 Personal history of pulmonary embolism: Secondary | ICD-10-CM

## 2021-02-25 DIAGNOSIS — G8929 Other chronic pain: Secondary | ICD-10-CM | POA: Diagnosis not present

## 2021-02-25 DIAGNOSIS — M65872 Other synovitis and tenosynovitis, left ankle and foot: Secondary | ICD-10-CM | POA: Diagnosis not present

## 2021-02-25 DIAGNOSIS — M19072 Primary osteoarthritis, left ankle and foot: Secondary | ICD-10-CM

## 2021-02-25 DIAGNOSIS — M722 Plantar fascial fibromatosis: Secondary | ICD-10-CM | POA: Diagnosis not present

## 2021-02-25 DIAGNOSIS — I10 Essential (primary) hypertension: Secondary | ICD-10-CM

## 2021-02-25 DIAGNOSIS — M1220 Villonodular synovitis (pigmented), unspecified site: Secondary | ICD-10-CM

## 2021-02-25 DIAGNOSIS — F419 Anxiety disorder, unspecified: Secondary | ICD-10-CM

## 2021-02-25 NOTE — Progress Notes (Signed)
Chronic Care Management Pharmacy Note  02/25/2021 Name:  [name] MRN:  [id number] DOB:  1956-04-29  Summary: addressed HTN, depression/anxiety, chronic pain, and hx of PE on chronic anticoagulation.  Recommendations/Changes made from today's visit: none, pt is doing well  Plan: f/u with pharmacist in 6 months  Subjective: [name] is an 65 y.o. year old female who is a primary patient of Emeterio Reeve, DO.  The CCM team was consulted for assistance with disease management and care coordination needs.    Engaged with patient by telephone for initial visit in response to provider referral for pharmacy case management and/or care coordination services.   Consent to Services:  The patient was given information about Chronic Care Management services, agreed to services, and gave verbal consent prior to initiation of services.  Please see initial visit note for detailed documentation.   Patient Care Team: Emeterio Reeve, DO as PCP - General (Osteopathic Medicine) Darius Bump, [facility] as Pharmacist (Pharmacist)  Recent office visits:  N/A   Recent consult visits:  02/14/21 Celesta Gentile DPM Podiatry- pt was seen for Status post left foot bone spur excision, PRP injection. Xray and MRI were ordered. F/u after MRI.   01/20/21 Celesta Gentile DPM-  pt was seen for Status post left foot bone spur excision, PRP injection. An Xray was done and pt was started on Mupirocin 2% BID and Cephalexin 500 mg qid. Follow up in 1 week.   12/20/20 Celesta Gentile DPM- pt was seen for a skin abrasion was started on Zofran HCI 4 mg q8h PRN and oxycodone- acetaminophen 5-325 mg 1-2 tablets q6h. No follow up noted.   12/20/20 Burney Gauze MD Oncology- pt was seen for hx of pulmonary embolism. Labs were ordered for the cancer center and provider discontinued pt's Ibuprofen 800 mg. Follow up 4 months   10/12/20 Celesta Gentile DPM- pt was seen for Capsulitis of left foot. Xray of left foot was  ordered and patient opted in for surgery.   Hospital visits:  1 Medication Reconciliation was completed by comparing discharge summary, patient's EMR and Pharmacy list, and upon discussion with patient.   Admitted to the hospital on 11/12/20 due to general surgery. Discharge date was 11/12/20. Discharged from [place]?Medications Started at [facility] Discharge:?? -started ibuprofen    Medication Changes at Hospital Discharge: -Changed  celecoxib  Eliquis  loratadine  omeprazole    Medications Discontinued at Hospital Discharge: -Stopped  dicyclomine 20 MG tablet  fexofenadine 180 MG tablet   Medications that remain the same after Hospital Discharge:??  -All other medications will remain the same.     2 Admitted to the hospital on 10/17/20 due to Cough. Discharge date was 10/17/20. Discharged from [facility]?Medications Started at [facility] Discharge:?? -started benzonatate  cefdinir  fexofenadine  predniSONE   Medication Changes at Hospital Discharge: -Changed none   Medications Discontinued at Hospital Discharge: -Stopped none   Medications that remain the same after Hospital Discharge:??  -All other medications will remain the same.   Objective:  Lab Results  Component Value Date   CREATININE 0.86 12/20/2020   CREATININE 0.92 11/05/2020   CREATININE 0.92 08/20/2020    No results found for: HGBA1C Last diabetic Eye exam: No results found for: HMDIABEYEEXA  Last diabetic Foot exam: No results found for: HMDIABFOOTEX      Component Value Date/Time   CHOL 215 (H) 02/01/2018  1205   TRIG 78 02/01/2018 1205   HDL 87 02/01/2018 1205   CHOLHDL 2.5 02/01/2018 1205   LDLCALC 111 (H) 02/01/2018 1205    Hepatic Function Latest Ref Rng & Units 12/20/2020 08/20/2020 05/20/2020  Total Protein 6.5 - 8.1 g/dL 6.7 7.0 6.7  Albumin 3.5 - 5.0 g/dL 3.8 3.9 3.8  AST 15 - 41 U/L 36 28 34  ALT 0 - 44 U/L 23 19 23    Alk Phosphatase 38 - 126 U/L 61 58 62  Total Bilirubin 0.3 - 1.2 mg/dL 0.7 0.5 0.6    Lab Results  Component Value Date/Time   TSH 2.21 02/01/2018 12:05 PM    CBC Latest Ref Rng & Units 12/20/2020 11/05/2020 08/20/2020  WBC 4.0 - 10.5 K/uL 6.7 6.9 8.0  Hemoglobin 12.0 - 15.0 g/dL 14.7 13.5 14.8  Hematocrit 36.0 - 46.0 % 44.3 42.2 45.1  Platelets 150 - 400 K/uL 240 271 249     Social History   Tobacco Use  Smoking Status Never   Passive exposure: Yes  Smokeless Tobacco Never   BP Readings from Last 3 Encounters:  12/20/20 (!) 135/94  11/12/20 135/86  11/05/20 (!) 137/94   Pulse Readings from Last 3 Encounters:  12/20/20 84  11/12/20 72  11/05/20 86   Wt Readings from Last 3 Encounters:  12/20/20 212 lb (96.2 kg)  11/12/20 214 lb (97.1 kg)  11/05/20 214 lb (97.1 kg)    Assessment: Review of patient past medical history, allergies, medications, health status, including review of consultants reports, laboratory and other test data, was performed as part of comprehensive evaluation and provision of chronic care management services.   SDOH:  (Social Determinants of Health) assessments and interventions performed:    CCM Care Plan  No Known Allergies  Medications Reviewed Today     Reviewed by Viviana Simpler, [facility] (Certified Podiatric Assistant) on 01/31/21 at [place] List Status: <None>   Medication Order Taking? Sig Documenting Provider Last Dose Status Informant  albuterol (VENTOLIN HFA) 108 (90 Base) MCG/ACT inhaler [id number] No Inhale 2 puffs into the lungs every 6 (six) hours as needed for wheezing or shortness of breath. Martyn Ehrich, NP unk Active Self  apixaban (ELIQUIS) 5 MG TABS tablet [id number] No Take 2.5 mg by mouth 2 (two) times daily. [provider] 11/09/2020 Active Self           Med Note Luana Shu, MONCHELL R   Fri Nov 12, 2020 11:27 AM) On hold as of 6.22.22  buPROPion (WELLBUTRIN XL) 300 MG 24 hr tablet [id number] No Take 1 tablet (300 mg  total) by mouth daily. Emeterio Reeve, DO 11/11/2020 Active Self  buPROPion (WELLBUTRIN XL) 300 MG 24 hr tablet [id number]  Take 1 tablet by mouth daily. [provider]  Active   CASCARA SAGRADA PO [id number] No Take 1 capsule by mouth at bedtime. [provider] 11/10/2020 Active Self  celecoxib (CELEBREX) 100 MG capsule [id number] No TAKE ONE TO TWO CAPSULES BY MOUTH TWICE A DAY Emeterio Reeve, DO 11/11/2020 1400 Active Self  cephALEXin (KEFLEX) 500 MG capsule [id number]  Take 1 capsule (500 mg total) by mouth 4 (four) times daily. Trula Slade, DPM  Active   Cholecalciferol (VITAMIN D) 125 MCG (5000 UT) CAPS [id number] No Take 5,000 Units by mouth daily. [provider] 11/11/2020 1400 Active Self  DULoxetine (CYMBALTA) 60 MG capsule [id number] No Take 60 mg by mouth daily. [provider] 11/11/2020 Active Self  estradiol (ESTRACE)  1 MG tablet [id number] No Take 1 tablet (1 mg total) by mouth daily. Emeterio Reeve, DO 11/11/2020 Active Self  loratadine (CLARITIN) 10 MG tablet [id number] No Take 1 tablet (10 mg total) by mouth daily. Emeterio Reeve, DO unk Active Self  mupirocin ointment (BACTROBAN) 2 % [id number]  Apply 1 application topically 2 (two) times daily. Trula Slade, DPM  Active   omeprazole (PRILOSEC) 40 MG capsule [id number] No TAKE 1 CAPSULE BY MOUTH IN THE MORNING AND AT BEDTIME. Emeterio Reeve, DO 11/11/2020 Active Self  ondansetron (ZOFRAN) 4 MG tablet [id number]  Take 1 tablet (4 mg total) by mouth every 8 (eight) hours as needed for nausea or vomiting. Trula Slade, DPM  Active   oxyCODONE-acetaminophen (PERCOCET/ROXICET) 5-325 MG tablet [id number]  Take 1-2 tablets by mouth every 6 (six) hours as needed for severe pain. Trula Slade, DPM  Active   pregabalin (LYRICA) 50 MG capsule [id number] No Take 1 capsule (50 mg total) by mouth 3 (three) times daily. Emeterio Reeve, DO 11/11/2020 1400 Expired 11/12/20 2359 Self   pregabalin (LYRICA) 50 MG capsule [id number]  Take by mouth. [provider]  Active   vitamin E 1000 UNIT capsule [id number] No Take 1,000 Units by mouth daily. [provider] 11/11/2020 1400 Active Self            Patient Active Problem List   Diagnosis Date Noted   At risk for obstructive sleep apnea 03/26/2020   History of pulmonary embolus (PE) 03/26/2020   Basal cell carcinoma (BCC) of right ala nasi 02/21/2019   Basal cell carcinoma (BCC) of right upper eyelid 02/07/2019   Dysphagia 10/30/2018   History of esophageal dilatation 10/30/2018   Chronic right shoulder pain 10/15/2018   Asthma 02/21/2018   Paralyzed hemidiaphragm 02/21/2018   Elevated hemidiaphragm 02/05/2018   Atelectasis 02/05/2018   Anxiety 02/01/2018   Cervical neck pain with evidence of disc disease 02/01/2018   BMI 38.0-38.9,adult 02/01/2018   Cataract 04/01/2015   Depression 04/01/2015   Hyperlipidemia 04/01/2015   Hypertension 04/01/2015   Disorder of intervertebral disc of cervical spine 04/01/2015   Lumbar disc disease 04/01/2015   Hormone replacement therapy (postmenopausal) 07/21/2014   Fatty (change of) liver, not elsewhere classified 05/29/2012    Immunization History  Administered Date(s) Administered   Moderna Sars-Covid-2 Vaccination 08/21/2019, 09/16/2019   Tdap 11/12/2019    Conditions to be addressed/monitored: HTN, Depression, and chronic pain, hx of PE on anticoagulant  There are no care plans that you recently modified to display for this patient.   Medication Assistance: None required.  Patient affirms current coverage meets needs.  Patient's preferred pharmacy is:  [id number] [address]Phone: [phone]: [phone] [id number] [place]Phone: [phone]:  [phone] Uses pill box? Yes Pt endorses 100% compliance  Follow Up:  Patient agrees to Care Plan and Follow-up.  Plan: Telephone follow up appointment with care management team member scheduled for:  6 months  KLarinda Buttery PharmD Clinical Pharmacist CNov[phone]

## 2021-02-28 NOTE — Patient Instructions (Signed)
Visit Information   PATIENT GOALS:   Goals Addressed             This Visit's Progress    Medication Management       Patient Goals/Self-Care Activities Over the next 180 days, patient will:  take medications as prescribed  Follow Up Plan: Telephone follow up appointment with care management team member scheduled for:  6 months          Consent to CCM Services: [name] was given information about Chronic Care Management services including:  CCM service includes personalized support from designated clinical staff supervised by her physician, including individualized plan of care and coordination with other care providers 24/7 contact phone numbers for assistance for urgent and routine care needs. Service will only be billed when office clinical staff spend 20 minutes or more in a month to coordinate care. Only one practitioner may furnish and bill the service in a calendar month. The patient may stop CCM services at any time (effective at the end of the month) by phone call to the office staff. The patient will be responsible for cost sharing (co-pay) of up to 20% of the service fee (after annual deductible is met).  Patient agreed to services and verbal consent obtained.   Patient verbalizes understanding of instructions provided today and agrees to view in [place].   Telephone follow up appointment with care management team member scheduled for: 6 months  [place]: Patient Care Plan: Medication Management     Problem Identified: HTN, mental health, chronic pain, hx of PE on anticoag      Long-Range Goal: Disease Progression Prevention   Start Date: 02/28/2021  This Visit's Progress: On track  Priority: High  Note:   Current Barriers:  None at present  Pharmacist Clinical Goal(s):  Over the next 180 days, patient will maintain control of chronic conditions as evidenced by medication fill history, lab values, and vital signs  through  collaboration with PharmD and provider.   Interventions: 1:1 collaboration with Emeterio Reeve, DO regarding development and update of comprehensive plan of care as evidenced by provider attestation and co-signature Inter-disciplinary care team collaboration (see longitudinal plan of care) Comprehensive medication review performed; medication list updated in electronic medical record  Hypertension:  Controlled; current treatment:lifestyle modifications only, lost weight and came off of BP meds;   Current home readings: 120/80-90s, states it fluctuates   Denies hypotensive/hypertensive symptoms  Recommended continue current regimen,   Hx of PE on long term anticoagulation:  Controlled; eliquis 2.75m BID;   Counseled on s/sx of bleeding and when to seek medical attention Recommended continue current regimen  Depression/Anxiety:  Controlled; current treatment: wellbutrin XL 3068mdaily, cymbalta 6042maily;   Recommended continue current regimen  Chronic Pain:  Controlled; current treatment: celebrex 100m20mD, duloxtein 60mg8mly, lyrica 50mg 28m   Recommended continue current regimen  Patient Goals/Self-Care Activities Over the next 180 days, patient will:  take medications as prescribed  Follow Up Plan: Telephone follow up appointment with care management team member scheduled for:  6 months

## 2021-03-01 ENCOUNTER — Ambulatory Visit: Payer: PPO | Admitting: Podiatry

## 2021-03-01 ENCOUNTER — Other Ambulatory Visit: Payer: Self-pay

## 2021-03-01 ENCOUNTER — Encounter: Payer: Self-pay | Admitting: Podiatry

## 2021-03-01 DIAGNOSIS — M7752 Other enthesopathy of left foot: Secondary | ICD-10-CM

## 2021-03-01 MED ORDER — METHYLPREDNISOLONE 4 MG PO TBPK: ORAL_TABLET | ORAL | 0 refills | Status: DC

## 2021-03-01 NOTE — Patient Instructions (Signed)
For instructions on how to put on your Tri-Lock Ankle Brace, please visit [email or web address]    Achilles Tendinitis  with Rehab Achilles tendinitis is a disorder of the Achilles tendon. The Achilles tendon connects the large calf muscles (Gastrocnemius and Soleus) to the heel bone (calcaneus). This tendon is sometimes called the heel cord. It is important for pushing-off and standing on your toes and is important for walking, running, or jumping. Tendinitis is often caused by overuse and repetitive microtrauma. SYMPTOMS Pain, tenderness, swelling, warmth, and redness may occur over the Achilles tendon even at rest. Pain with pushing off, or flexing or extending the ankle. Pain that is worsened after or during activity. CAUSES  Overuse sometimes seen with rapid increase in exercise programs or in sports requiring running and jumping. Poor physical conditioning (strength and flexibility or endurance). Running sports, especially training running down hills. Inadequate warm-up before practice or play or failure to stretch before participation. Injury to the tendon. PREVENTION  Warm up and stretch before practice or competition. Allow time for adequate rest and recovery between practices and competition. Keep up conditioning. Keep up ankle and leg flexibility. Improve or keep muscle strength and endurance. Improve cardiovascular fitness. Use proper technique. Use proper equipment (shoes, skates). To help prevent recurrence, taping, protective strapping, or an adhesive bandage may be recommended for several weeks after healing is complete. PROGNOSIS  Recovery may take weeks to several months to heal. Longer recovery is expected if symptoms have been prolonged. Recovery is usually quicker if the inflammation is due to a direct blow as compared with overuse or sudden strain. RELATED COMPLICATIONS  Healing time will be prolonged if the condition is not correctly treated. The injury must  be given plenty of time to heal. Symptoms can reoccur if activity is resumed too soon. Untreated, tendinitis may increase the risk of tendon rupture requiring additional time for recovery and possibly surgery. TREATMENT  The first treatment consists of rest anti-inflammatory medication, and ice to relieve the pain. Stretching and strengthening exercises after resolution of pain will likely help reduce the risk of recurrence. Referral to a physical therapist or athletic trainer for further evaluation and treatment may be helpful. A walking boot or cast may be recommended to rest the Achilles tendon. This can help break the cycle of inflammation and microtrauma. Arch supports (orthotics) may be prescribed or recommended by your caregiver as an adjunct to therapy and rest. Surgery to remove the inflamed tendon lining or degenerated tendon tissue is rarely necessary and has shown less than predictable results. MEDICATION  Nonsteroidal anti-inflammatory medications, such as aspirin and ibuprofen, may be used for pain and inflammation relief. Do not take within 7 days before surgery. Take these as directed by your caregiver. Contact your caregiver immediately if any bleeding, stomach upset, or signs of allergic reaction occur. Other minor pain relievers, such as acetaminophen, may also be used. Pain relievers may be prescribed as necessary by your caregiver. Do not take prescription pain medication for longer than 4 to 7 days. Use only as directed and only as much as you need. Cortisone injections are rarely indicated. Cortisone injections may weaken tendons and predispose to rupture. It is better to give the condition more time to heal than to use them. HEAT AND COLD Cold is used to relieve pain and reduce inflammation for acute and chronic Achilles tendinitis. Cold should be applied for 10 to 15 minutes every 2 to 3 hours for inflammation and pain and immediately after any activity that   aggravates your  symptoms. Use ice packs or an ice massage. Heat may be used before performing stretching and strengthening activities prescribed by your caregiver. Use a heat pack or a warm soak. SEEK MEDICAL CARE IF: Symptoms get worse or do not improve in 2 weeks despite treatment. New, unexplained symptoms develop. Drugs used in treatment may produce side effects.  EXERCISES:  RANGE OF MOTION (ROM) AND STRETCHING EXERCISES - Achilles Tendinitis  These exercises may help you when beginning to rehabilitate your injury. Your symptoms may resolve with or without further involvement from your physician, physical therapist or athletic trainer. While completing these exercises, remember:  Restoring tissue flexibility helps normal motion to return to the joints. This allows healthier, less painful movement and activity. An effective stretch should be held for at least 30 seconds. A stretch should never be painful. You should only feel a gentle lengthening or release in the stretched tissue.  STRETCH  Gastroc, Standing  Place hands on wall. Extend right / left leg, keeping the front knee somewhat bent. Slightly point your toes inward on your back foot. Keeping your right / left heel on the floor and your knee straight, shift your weight toward the wall, not allowing your back to arch. You should feel a gentle stretch in the right / left calf. Hold this position for 10 seconds. Repeat 3 times. Complete this stretch 2 times per day.  STRETCH  Soleus, Standing  Place hands on wall. Extend right / left leg, keeping the other knee somewhat bent. Slightly point your toes inward on your back foot. Keep your right / left heel on the floor, bend your back knee, and slightly shift your weight over the back leg so that you feel a gentle stretch deep in your back calf. Hold this position for 10 seconds. Repeat 3 times. Complete this stretch 2 times per day.  STRETCH  Gastrocsoleus, Standing  Note: This exercise can  place a lot of stress on your foot and ankle. Please complete this exercise only if specifically instructed by your caregiver.  Place the ball of your right / left foot on a step, keeping your other foot firmly on the same step. Hold on to the wall or a rail for balance. Slowly lift your other foot, allowing your body weight to press your heel down over the edge of the step. You should feel a stretch in your right / left calf. Hold this position for 10 seconds. Repeat this exercise with a slight bend in your knee. Repeat 3 times. Complete this stretch 2 times per day.   STRENGTHENING EXERCISES - Achilles Tendinitis These exercises may help you when beginning to rehabilitate your injury. They may resolve your symptoms with or without further involvement from your physician, physical therapist or athletic trainer. While completing these exercises, remember:  Muscles can gain both the endurance and the strength needed for everyday activities through controlled exercises. Complete these exercises as instructed by your physician, physical therapist or athletic trainer. Progress the resistance and repetitions only as guided. You may experience muscle soreness or fatigue, but the pain or discomfort you are trying to eliminate should never worsen during these exercises. If this pain does worsen, stop and make certain you are following the directions exactly. If the pain is still present after adjustments, discontinue the exercise until you can discuss the trouble with your clinician.  STRENGTH - Plantar-flexors  Sit with your right / left leg extended. Holding onto both ends of a rubber exercise   band/tubing, loop it around the ball of your foot. Keep a slight tension in the band. Slowly push your toes away from you, pointing them downward. Hold this position for 10 seconds. Return slowly, controlling the tension in the band/tubing. Repeat 3 times. Complete this exercise 2 times per day.   STRENGTH -  Plantar-flexors  Stand with your feet shoulder width apart. Steady yourself with a wall or table using as little support as needed. Keeping your weight evenly spread over the width of your feet, rise up on your toes.* Hold this position for 10 seconds. Repeat 3 times. Complete this exercise 2 times per day.  *If this is too easy, shift your weight toward your right / left leg until you feel challenged. Ultimately, you may be asked to do this exercise with your right / left foot only.  STRENGTH  Plantar-flexors, Eccentric  Note: This exercise can place a lot of stress on your foot and ankle. Please complete this exercise only if specifically instructed by your caregiver.  Place the balls of your feet on a step. With your hands, use only enough support from a wall or rail to keep your balance. Keep your knees straight and rise up on your toes. Slowly shift your weight entirely to your right / left toes and pick up your opposite foot. Gently and with controlled movement, lower your weight through your right / left foot so that your heel drops below the level of the step. You will feel a slight stretch in the back of your calf at the end position. Use the healthy leg to help rise up onto the balls of both feet, then lower weight only on the right / left leg again. Build up to 15 repetitions. Then progress to 3 consecutive sets of 15 repetitions.* After completing the above exercise, complete the same exercise with a slight knee bend (about 30 degrees). Again, build up to 15 repetitions. Then progress to 3 consecutive sets of 15 repetitions.* Perform this exercise 2 times per day.  *When you easily complete 3 sets of 15, your physician, physical therapist or athletic trainer may advise you to add resistance by wearing a backpack filled with additional weight.  STRENGTH - Plantar Flexors, Seated  Sit on a chair that allows your feet to rest flat on the ground. If necessary, sit at the edge of the  chair. Keeping your toes firmly on the ground, lift your right / left heel as far as you can without increasing any discomfort in your ankle. Repeat 3 times. Complete this exercise 2 times a day.  

## 2021-03-05 NOTE — Progress Notes (Signed)
Subjective: [name] is a 65 y.o. is seen today in office s/p left foot bone spur excision, PRP injection preformed on 12/22/2020.  She presents today for follow-up after an MRI performed.  She states that she gets discomfort to the top, and also starting the instep.  No recent injury or changes otherwise since I last saw her.  The surgical site has been feeling okay.  Some discomfort some swelling noted but that part is been doing well.  Denies any fevers or chills.  No other concerns.  MRI 02/25/2021 IMPRESSION: 1. Severe tenosynovitis of the extensor digitorum tendon sheath. 2. Longitudinal split tear of the peroneus brevis with mild tenosynovitis. 3. Severe osteoarthritis of the navicular-cuneiform joints with severe subchondral marrow edema and cystic changes. 4. Mild plantar fasciitis of the medial band of the plantar fascia.  Objective: General: No acute distress, AAOx3 -husband present DP/PT pulses palpable 2/4, CRT < 3 sec to all digits.  Protective sensation intact. Motor function intact.  LEFT foot: Incision is well coapted without any evidence of dehiscence and scar is formed.  There is minimal edema there is no erythema warmth or any signs of infection.  The majority tenderness is still localized along the anterior lateral aspect of the ankle joint and there is minimal edema present.  She is also try to get some discomfort on the instep on the left foot as well.  I think this is coming more from compensation.  No area of pinpoint tenderness. No pain with calf compression, swelling, warmth, erythema.   Assessment and Plan:  Status post left foot bone spur excision, PRP injection; arthritis  -Treatment options discussed including all alternatives, risks, and complications -I did the MRI with her.  I think the majority of her symptoms are coming from the tenosynovitis of the extensor digitorum tendon sheath.  Also having some discomfort in the arthritis.  Given her MRI findings as  well as where she is having discomfort I will put her into a Tri-Lock ankle brace was dispensed today.  Encouraged ice elevation.  Prescribed a Medrol Dosepak as she did not do anti-inflammatories given the Eliquis.  If needed we can return to physical therapy.  Discussed shoes and good arch support which she already has.  Return in about 6 weeks (around 04/12/2021).  Trula Slade DPM

## 2021-03-21 DIAGNOSIS — I10 Essential (primary) hypertension: Secondary | ICD-10-CM

## 2021-03-22 IMAGING — CT CT ANGIO CHEST
2 of 8 series · 18 of 36 positions shown · IV contrast (Omnipaque)
Comparison: CT a chest [DATE]

CLINICAL DATA: Follow-up pulmonary embolus. Patient has been
treated with Eliquis.

EXAM:
CT ANGIOGRAPHY CHEST WITH CONTRAST
TECHNIQUE: Multidetector CT imaging of the chest was performed using the
standard protocol during bolus administration of intravenous
contrast. Multiplanar CT image reconstructions and MIPs were
obtained to evaluate the vascular anatomy.
CONTRAST:  100mL OMNIPAQUE IOHEXOL 350 MG/ML SOLN

[Series 5: pe thins · axial · 0.87mm/px · z∈[-194,+46]mm · 17 of 270 slices shown]
[im 15/270  lung]
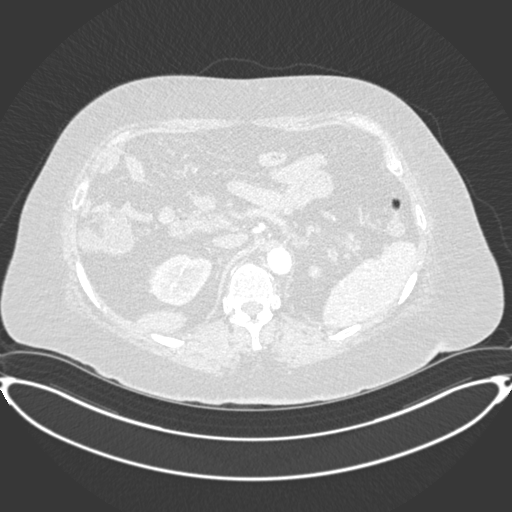
[im 29/270  mediastinal]
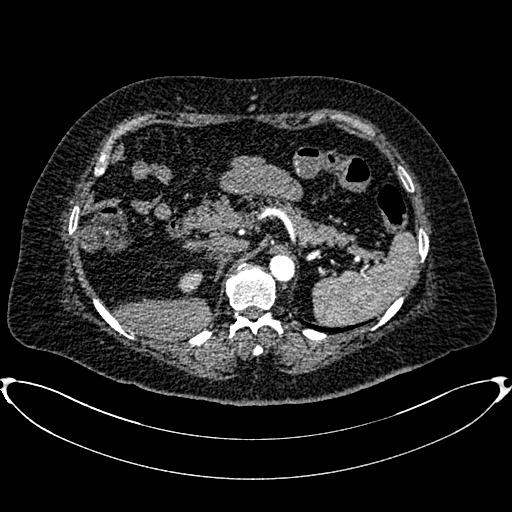
[im 43/270  lung]
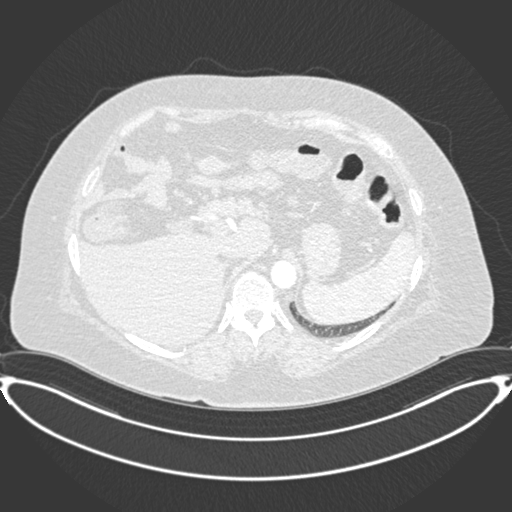
[im 57/270  mediastinal]
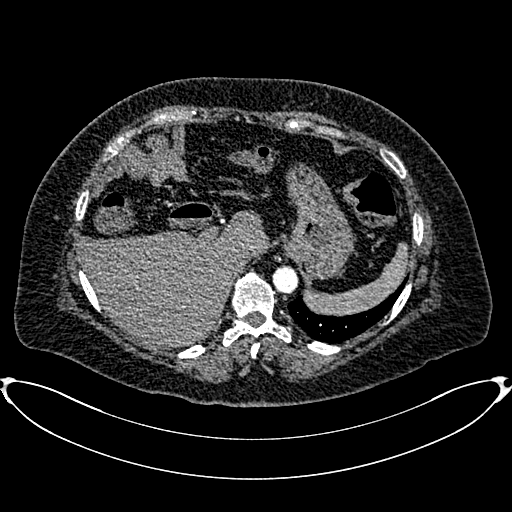
[im 71/270  lung]
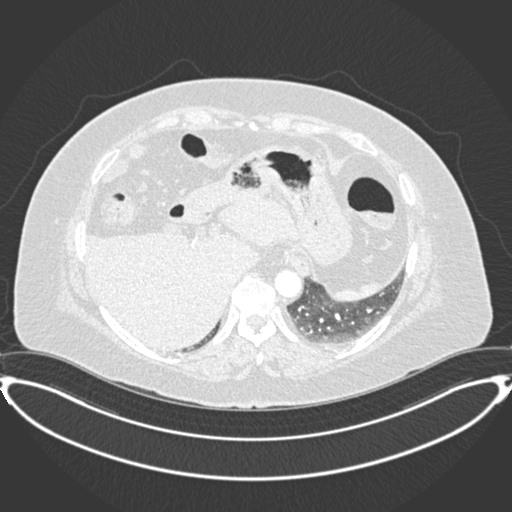
[im 85/270  mediastinal]
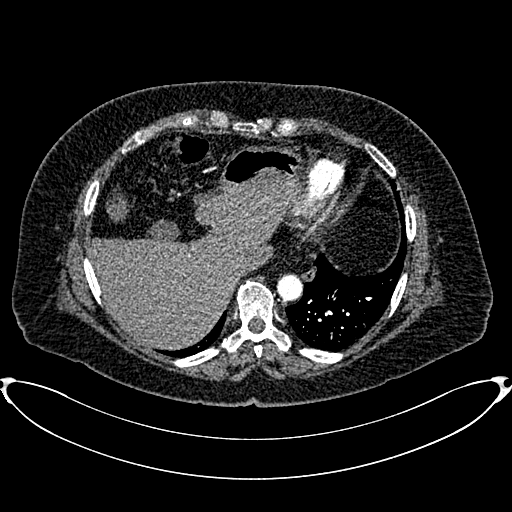
[im 100/270  lung]
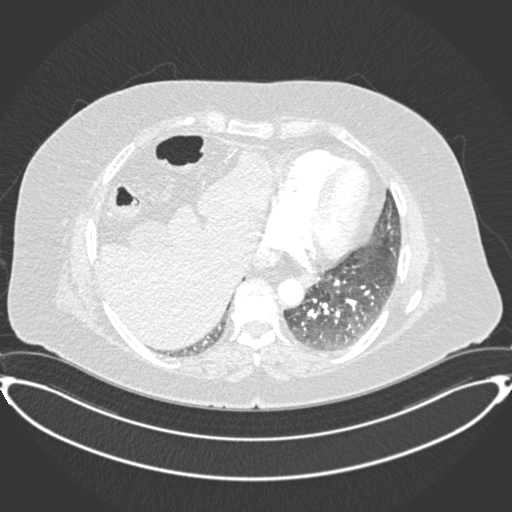
[im 114/270  mediastinal]
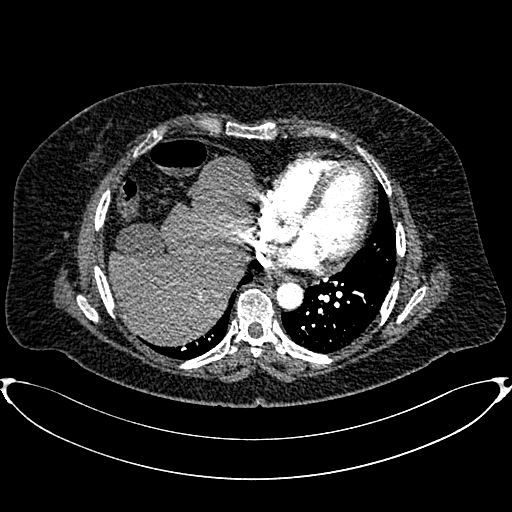
[im 142/270  lung]
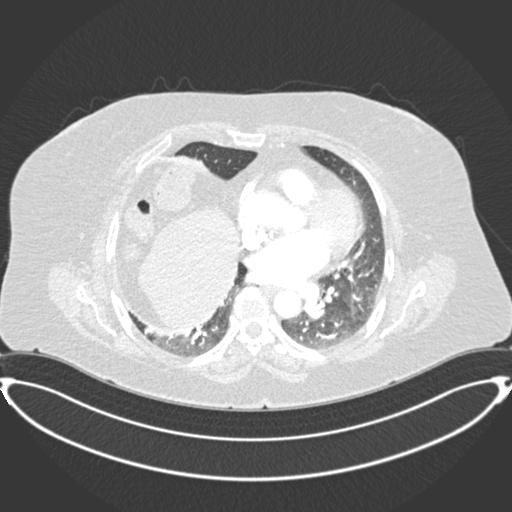
[im 156/270  mediastinal]
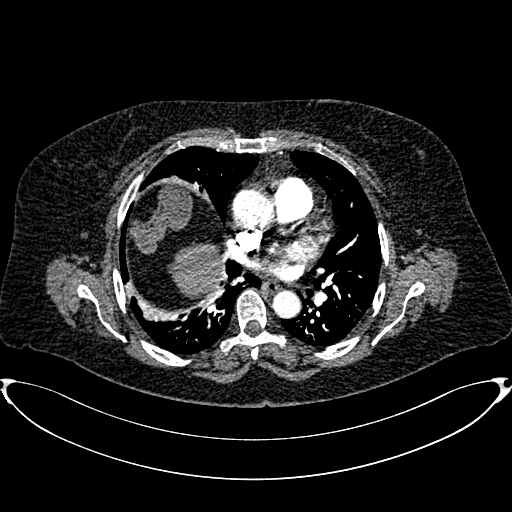
[im 170/270  lung]
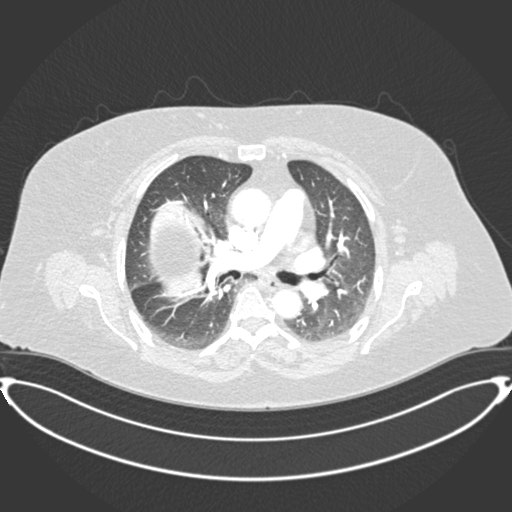
[im 185/270  mediastinal]
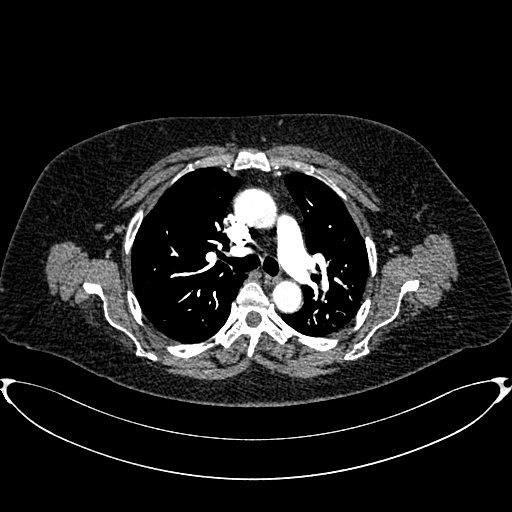
[im 199/270  lung]
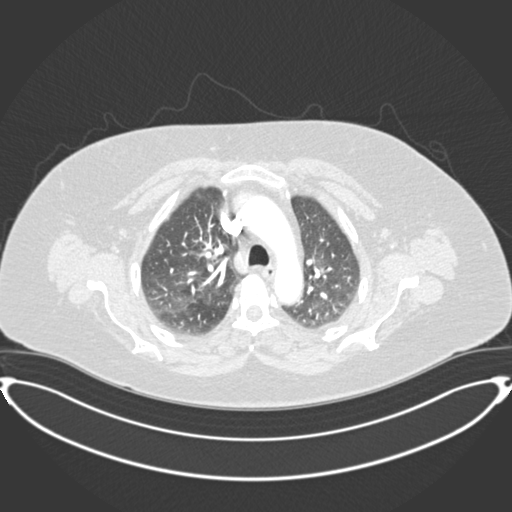
[im 213/270  mediastinal]
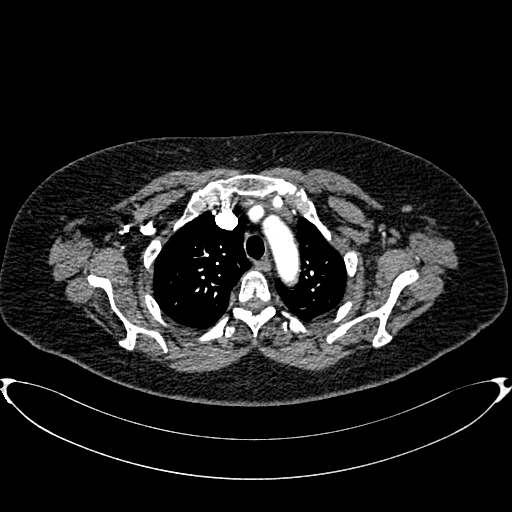
[im 227/270  lung]
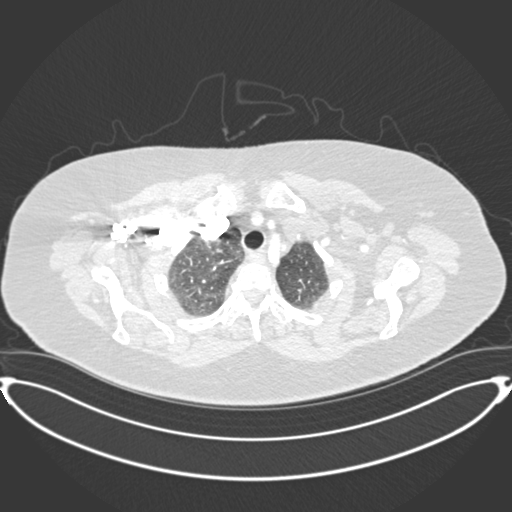
[im 241/270  mediastinal]
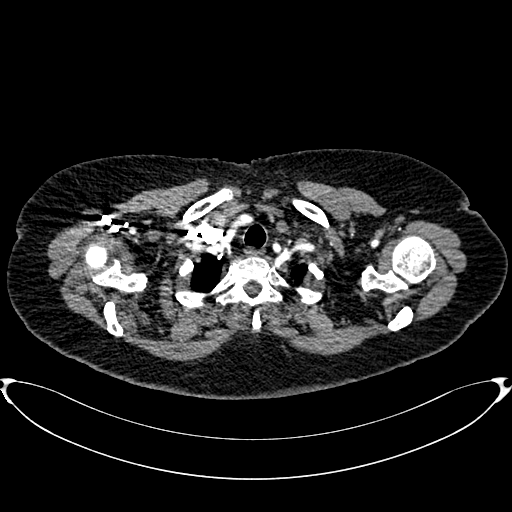
[im 255/270  lung]
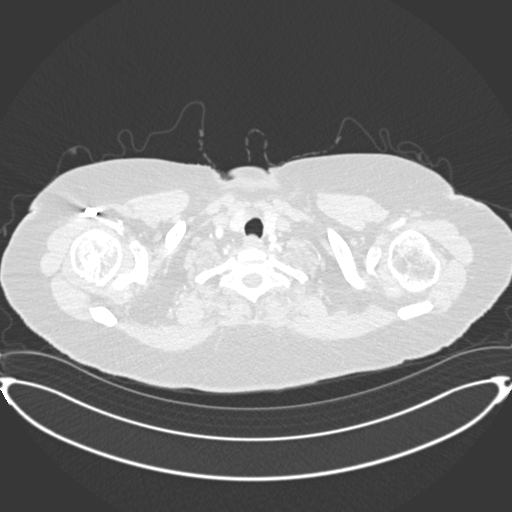

[Series 7: pe coronal mpr · coronal · 0.54mm/px · 1 of 137 slices shown]
[im 69/137  mediastinal]
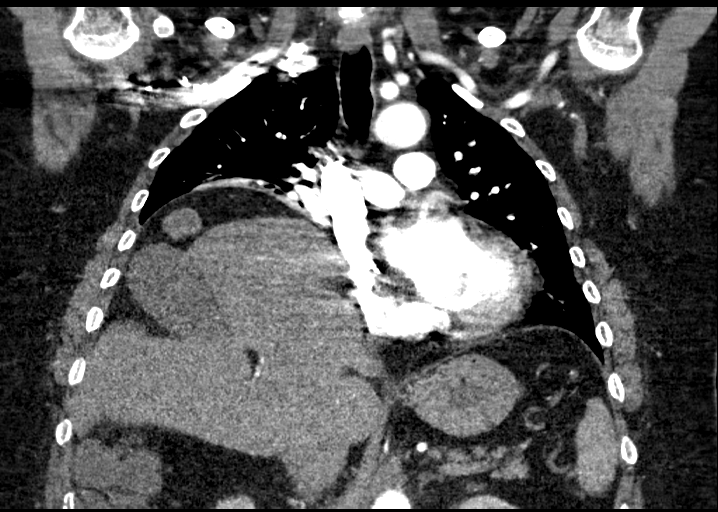

[18 of 36 positions shown; findings below may reference images not displayed]

FINDINGS: Cardiovascular: Heart size is normal. Aorta and great vessel origins
are within normal limits.

Previously seen right upper lobe pulmonary emboli have resolved. No
focal filling defects are present on today's study. The pulmonary
arteries are of normal size. Right atrium is unremarkable.

Mediastinum/Nodes: No significant mediastinal hilar, or axillary
adenopathy is present. The thoracic inlet is within normal limits.
Esophagus is within normal limits.

Lungs/Pleura: Mild dependent atelectasis is present both lungs. No
nodule, mass, or airspace disease is present. There is no effusion
or pneumothorax.

Upper Abdomen: Chronic elevation of the right hemidiaphragm is again
noted. Focal lesions are present in the visualized abdomen.

Musculoskeletal: Vertebral body heights and alignment are
maintained. No focal lytic or blastic lesions are present. Ribs are
within normal limits bilaterally.

Review of the MIP images confirms the above findings.
IMPRESSION: 1. Interval resolution of previously seen right upper lobe pulmonary
emboli.
2. No new pulmonary embolus.
3. Mild dependent atelectasis is present both lungs.
4. Chronic elevation of the right hemidiaphragm.

## 2021-04-12 ENCOUNTER — Encounter: Payer: Self-pay | Admitting: Podiatry

## 2021-04-12 ENCOUNTER — Ambulatory Visit: Payer: PPO | Admitting: Podiatry

## 2021-04-12 ENCOUNTER — Other Ambulatory Visit: Payer: Self-pay

## 2021-04-12 DIAGNOSIS — M778 Other enthesopathies, not elsewhere classified: Secondary | ICD-10-CM

## 2021-04-12 MED ORDER — DEXAMETHASONE SODIUM PHOSPHATE 120 MG/30ML IJ SOLN
4.0000 mg | Freq: Once | INTRAMUSCULAR | Status: AC
  Administered 2021-04-12: 4 mg via INTRA_ARTICULAR

## 2021-04-12 MED ORDER — DICLOFENAC EPOLAMINE 1.3 % EX PTCH: 1.0000 | MEDICATED_PATCH | Freq: Two times a day (BID) | CUTANEOUS | 1 refills | Status: DC

## 2021-04-19 NOTE — Progress Notes (Signed)
Subjective: 65 year old female presents to the office today for concerns of discomfort to her foot, ankle.  She says it feels different from the surgery.  The surgery took care of the pain from the spur but she is feeling a different type of pain now and likely from arthritis she thinks.  She is also gets a knot occasionally.  Minimal swelling.  No recent injury.  Otherwise has been doing well.  No fevers or chills.  Objective: AAO x3, NAD DP/PT pulses palpable bilaterally, CRT less than 3 seconds Incision is well coapted and scar is formed.  There is minimal edema present.  There is mild discomfort still at surgical site.  Small amount of swelling present to this area but also along anterior ankle from where the fluid was drained previously.  There is no pain with wrinkling to motion.  MMT 5/5 No pain with calf compression, swelling, warmth, erythema  Assessment: Right foot capsulitis, arthritis  Plan: -All treatment options discussed with the patient including all alternatives, risks, complications.  -I do think she still has discomfort and swelling to the underlying arch for this.  Discussed shoes and arch support and stiffer soled shoes.  We discussed steroid injection she wishes to proceed with this.  Discussed risks.  I prepped the skin with alcohol and mixture of 1 cc dexamethasone phosphate, 1 cc Marcaine plain was infiltrated into the area of tenderness on the dorsal aspect of the foot.  Postinjection care was discussed.  Recommend her to wear the cam boot for the next several days to help prevent any complications. -Patient encouraged to call the office with any questions, concerns, change in symptoms.   Trula Slade DPM

## 2021-04-21 ENCOUNTER — Inpatient Hospital Stay: Payer: PPO | Attending: Hematology & Oncology

## 2021-04-21 ENCOUNTER — Encounter: Payer: Self-pay | Admitting: Hematology & Oncology

## 2021-04-21 ENCOUNTER — Inpatient Hospital Stay (HOSPITAL_BASED_OUTPATIENT_CLINIC_OR_DEPARTMENT_OTHER): Payer: PPO | Admitting: Hematology & Oncology

## 2021-04-21 ENCOUNTER — Other Ambulatory Visit: Payer: Self-pay

## 2021-04-21 VITALS — BP 141/74 | HR 94 | Temp 98.2°F | Wt 212.0 lb

## 2021-04-21 DIAGNOSIS — Z86711 Personal history of pulmonary embolism: Secondary | ICD-10-CM | POA: Diagnosis not present

## 2021-04-21 DIAGNOSIS — Z7901 Long term (current) use of anticoagulants: Secondary | ICD-10-CM | POA: Insufficient documentation

## 2021-04-21 DIAGNOSIS — I2699 Other pulmonary embolism without acute cor pulmonale: Secondary | ICD-10-CM | POA: Diagnosis not present

## 2021-04-21 LAB — CMP (CANCER CENTER ONLY)
ALT: 16 U/L (ref 0–44)
AST: 24 U/L (ref 15–41)
Albumin: 3.9 g/dL (ref 3.5–5.0)
Alkaline Phosphatase: 58 U/L (ref 38–126)
Anion gap: 8 (ref 5–15)
BUN: 20 mg/dL (ref 8–23)
CO2: 28 mmol/L (ref 22–32)
Calcium: 9.5 mg/dL (ref 8.9–10.3)
Chloride: 103 mmol/L (ref 98–111)
Creatinine: 0.92 mg/dL (ref 0.44–1.00)
GFR, Estimated: >60 mL/min (ref >60)
Glucose, Bld: 93 mg/dL (ref 70–99)
Potassium: 4.4 mmol/L (ref 3.5–5.1)
Sodium: 139 mmol/L (ref 135–145)
Total Bilirubin: 0.7 mg/dL (ref 0.3–1.2)
Total Protein: 6.7 g/dL (ref 6.5–8.1)

## 2021-04-21 LAB — CBC WITH DIFFERENTIAL (CANCER CENTER ONLY)
Abs Immature Granulocytes: 0.03 10*3/uL (ref 0.00–0.07)
Basophils Absolute: 0 10*3/uL (ref 0.0–0.1)
Basophils Relative: 1 %
Eosinophils Absolute: 0.1 10*3/uL (ref 0.0–0.5)
Eosinophils Relative: 1 %
HCT: 45 % (ref 36.0–46.0)
Hemoglobin: 14.8 g/dL (ref 12.0–15.0)
Immature Granulocytes: 0 %
Lymphocytes Relative: 46 %
Lymphs Abs: 3.4 10*3/uL (ref 0.7–4.0)
MCH: 33.1 pg (ref 26.0–34.0)
MCHC: 32.9 g/dL (ref 30.0–36.0)
MCV: 100.7 fL — ABNORMAL HIGH (ref 80.0–100.0)
Monocytes Absolute: 0.6 10*3/uL (ref 0.1–1.0)
Monocytes Relative: 8 %
Neutro Abs: 3.2 10*3/uL (ref 1.7–7.7)
Neutrophils Relative %: 44 %
Platelet Count: 227 10*3/uL (ref 150–400)
RBC: 4.47 MIL/uL (ref 3.87–5.11)
RDW: 13.3 % (ref 11.5–15.5)
WBC Count: 7.4 10*3/uL (ref 4.0–10.5)
nRBC: 0 % (ref 0.0–0.2)

## 2021-04-21 NOTE — Progress Notes (Signed)
Hematology and Oncology Follow Up Visit  [name] [id number] 1956-04-26 65 y.o. 04/21/2021   Principle Diagnosis:  Pulmonary embolism -- dx 'ed in 01/2020   Past Work-up: Hyper coag panel negative   Current Therapy: Eliquis 2.5 mg PO BID -- complete 1 yr of therapy in 07/2021   Interim History:  [name] is here today with her husband for follow-up.  She looks good.  She is losing some weight.  She is very happy about this..  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  I am not sure exactly as to why this has happened.  She says she has had quite a few tests for this.  She did have the foot surgery.  She unfortunately did develop a infection.  She needed to be in the boot for about a month or so for longer.  She has had no problems with bleeding.  She has had no change in bowel or bladder habits.  Her last mammogram was a couple months ago and everything looked okay on the mammogram.  She has had no chest wall pain.  There is no cough.  She has had no issues with COVID.  Overall, I would have to say that her performance status is ECOG 1.    Medications:  Allergies as of 04/21/2021   No Known Allergies      Medication List        Accurate as of April 21, 2021  3:11 PM. If you have any questions, ask your nurse or doctor.          albuterol 108 (90 Base) MCG/ACT inhaler Commonly known as: VENTOLIN HFA Inhale 2 puffs into the lungs every 6 (six) hours as needed for wheezing or shortness of breath.   buPROPion 300 MG 24 hr tablet Commonly known as: WELLBUTRIN XL Take 1 tablet (300 mg total) by mouth daily.   CASCARA SAGRADA PO Take 1 capsule by mouth at bedtime.   celecoxib 100 MG capsule Commonly known as: CELEBREX TAKE ONE TO TWO CAPSULES BY MOUTH TWICE A DAY   diclofenac 1.3 % Ptch Commonly known as: Flector Place 1 patch onto the skin 2 (two) times daily.   DULoxetine 60 MG capsule Commonly known as: CYMBALTA Take 1 capsule  (60 mg total) by mouth daily. Needs to establish with new PCP for future refills.   Eliquis 5 MG Tabs tablet Generic drug: apixaban Take 2.5 mg by mouth 2 (two) times daily.   estradiol 1 MG tablet Commonly known as: ESTRACE Take 1 tablet (1 mg total) by mouth daily.   loratadine 10 MG tablet Commonly known as: CLARITIN Take 1 tablet (10 mg total) by mouth daily.   methylPREDNISolone 4 MG Tbpk tablet Commonly known as: MEDROL DOSEPAK Take as directed   omeprazole 40 MG capsule Commonly known as: PRILOSEC TAKE 1 CAPSULE BY MOUTH IN THE MORNING AND AT BEDTIME.   pregabalin 50 MG capsule Commonly known as: Lyrica Take 1 capsule (50 mg total) by mouth 3 (three) times daily.   Vitamin D 125 MCG (5000 UT) Caps Take 5,000 Units by mouth daily.   vitamin E 1000 UNIT capsule Take 1,000 Units by mouth daily.        Allergies: No Known Allergies  Past Medical History, Surgical history, Social history, and Family History were reviewed and updated.  Review of Systems: Review of Systems  Constitutional: Negative.   HENT: Negative.    Eyes: Negative.   Respiratory: Negative.  Cardiovascular: Negative.   Gastrointestinal: Negative.   Genitourinary: Negative.   Musculoskeletal: Negative.   Skin: Negative.   Neurological: Negative.   Endo/Heme/Allergies: Negative.   Psychiatric/Behavioral: Negative.      Physical Exam:  weight is 212 lb (96.2 kg). Her oral temperature is 98.2 F (36.8 C). Her blood pressure is 141/74 (abnormal) and her pulse is 94. Her oxygen saturation is 100%.   Wt Readings from Last 3 Encounters:  04/21/21 212 lb (96.2 kg)  12/20/20 212 lb (96.2 kg)  11/12/20 214 lb (97.1 kg)    Physical Exam Vitals reviewed.  HENT:     Head: Normocephalic and atraumatic.  Eyes:     Pupils: Pupils are equal, round, and reactive to light.  Cardiovascular:     Rate and Rhythm: Normal rate and regular rhythm.     Heart sounds: Normal heart sounds.   Pulmonary:     Effort: Pulmonary effort is normal.     Breath sounds: Normal breath sounds.  Abdominal:     General: Bowel sounds are normal.     Palpations: Abdomen is soft.  Musculoskeletal:        General: No tenderness or deformity. Normal range of motion.     Cervical back: Normal range of motion.  Lymphadenopathy:     Cervical: No cervical adenopathy.  Skin:    General: Skin is warm and dry.     Findings: No erythema or rash.  Neurological:     Mental Status: She is alert and oriented to person, place, and time.  Psychiatric:        Behavior: Behavior normal.        Thought Content: Thought content normal.        Judgment: Judgment normal.    Lab Results  Component Value Date   WBC 7.4 04/21/2021   HGB 14.8 04/21/2021   HCT 45.0 04/21/2021   MCV 100.7 (H) 04/21/2021   PLT 227 04/21/2021   Lab Results  Component Value Date   FERRITIN 439 (H) 04/20/2020   IRON 88 04/20/2020   TIBC 223 (L) 04/20/2020   UIBC 135 04/20/2020   IRONPCTSAT 39 04/20/2020   Lab Results  Component Value Date   RETICCTPCT 2.2 04/20/2020   RBC 4.47 04/21/2021   No results found for: KPAFRELGTCHN, LAMBDASER, KAPLAMBRATIO No results found for: IGGSERUM, IGA, IGMSERUM No results found for: Odetta Pink, SPEI   Chemistry      Component Value Date/Time   NA 139 04/21/2021 1427   K 4.4 04/21/2021 1427   CL 103 04/21/2021 1427   CO2 28 04/21/2021 1427   BUN 20 04/21/2021 1427   CREATININE 0.92 04/21/2021 1427   CREATININE 0.78 02/09/2020 1415      Component Value Date/Time   CALCIUM 9.5 04/21/2021 1427   ALKPHOS 58 04/21/2021 1427   AST 24 04/21/2021 1427   ALT 16 04/21/2021 1427   BILITOT 0.7 04/21/2021 1427       Impression and Plan: [name] is a very pleasant 65 yo caucasian female with recent diagnosis of small right upper lobe pulmonary embolus.  This appears to be idiopathic.  She is on maintenance Eliquis right now.   I would keep her on maintenance Eliquis until March of next year.  So happy that everything is going well for her.  I hate that she had the infection of the foot after her surgery.  This seems to have healed up nicely.  We will plan  to get her back in March.  At that time, we will just have her get off Eliquis.    Karina Napoleon, MD 12/1/20223:11 PM

## 2021-04-26 ENCOUNTER — Other Ambulatory Visit: Payer: Self-pay | Admitting: Medical-Surgical

## 2021-04-26 ENCOUNTER — Other Ambulatory Visit: Payer: Self-pay | Admitting: Family Medicine

## 2021-04-26 MED ORDER — ESTRADIOL 1 MG PO TABS: 1.0000 mg | ORAL_TABLET | Freq: Every day | ORAL | 1 refills | Status: DC

## 2021-04-26 MED ORDER — CELECOXIB 100 MG PO CAPS: ORAL_CAPSULE | ORAL | 1 refills | Status: DC

## 2021-04-26 MED ORDER — PREGABALIN 50 MG PO CAPS: 50.0000 mg | ORAL_CAPSULE | Freq: Three times a day (TID) | ORAL | 1 refills | Status: DC

## 2021-04-26 MED ORDER — DULOXETINE HCL 60 MG PO CPEP: 60.0000 mg | ORAL_CAPSULE | Freq: Every day | ORAL | 1 refills | Status: DC

## 2021-04-26 MED ORDER — BUPROPION HCL ER (XL) 300 MG PO TB24: 300.0000 mg | ORAL_TABLET | Freq: Every day | ORAL | 1 refills | Status: DC

## 2021-04-26 NOTE — Progress Notes (Signed)
Refills of medications requested have been sent to the pharmacy.  Please contact [facility] to cancel the pregabalin prescription that was sent to them erroneously.  New prescription for pregabalin sent to [facility] per patient request.  ___________________________________________ Clearnce Sorrel, DNP, APRN, FNP-BC [facility] [place]

## 2021-04-27 NOTE — Progress Notes (Signed)
Rx has been canceled.

## 2021-06-13 ENCOUNTER — Ambulatory Visit: Payer: PPO | Admitting: Podiatry

## 2021-06-14 ENCOUNTER — Other Ambulatory Visit: Payer: Self-pay

## 2021-06-14 ENCOUNTER — Encounter: Payer: Self-pay | Admitting: Medical-Surgical

## 2021-06-14 ENCOUNTER — Ambulatory Visit (INDEPENDENT_AMBULATORY_CARE_PROVIDER_SITE_OTHER): Payer: PPO | Admitting: Medical-Surgical

## 2021-06-14 VITALS — BP 141/85 | HR 80 | Temp 97.7°F | Ht 62.75 in | Wt 215.7 lb

## 2021-06-14 DIAGNOSIS — R718 Other abnormality of red blood cells: Secondary | ICD-10-CM | POA: Diagnosis not present

## 2021-06-14 DIAGNOSIS — Z0000 Encounter for general adult medical examination without abnormal findings: Secondary | ICD-10-CM

## 2021-06-14 DIAGNOSIS — Z1159 Encounter for screening for other viral diseases: Secondary | ICD-10-CM

## 2021-06-14 DIAGNOSIS — Z9189 Other specified personal risk factors, not elsewhere classified: Secondary | ICD-10-CM | POA: Diagnosis not present

## 2021-06-14 DIAGNOSIS — G4700 Insomnia, unspecified: Secondary | ICD-10-CM

## 2021-06-14 DIAGNOSIS — I10 Essential (primary) hypertension: Secondary | ICD-10-CM

## 2021-06-14 DIAGNOSIS — Z23 Encounter for immunization: Secondary | ICD-10-CM | POA: Diagnosis not present

## 2021-06-14 DIAGNOSIS — E785 Hyperlipidemia, unspecified: Secondary | ICD-10-CM | POA: Diagnosis not present

## 2021-06-14 MED ORDER — ESTRADIOL 1 MG PO TABS: 1.0000 mg | ORAL_TABLET | Freq: Every day | ORAL | 1 refills | Status: DC

## 2021-06-14 MED ORDER — BUPROPION HCL ER (XL) 300 MG PO TB24: 300.0000 mg | ORAL_TABLET | Freq: Every day | ORAL | 1 refills | Status: DC

## 2021-06-14 MED ORDER — PREGABALIN 75 MG PO CAPS: 75.0000 mg | ORAL_CAPSULE | Freq: Three times a day (TID) | ORAL | 2 refills | Status: DC

## 2021-06-14 MED ORDER — CELECOXIB 100 MG PO CAPS: ORAL_CAPSULE | ORAL | 1 refills | Status: DC

## 2021-06-14 MED ORDER — DULOXETINE HCL 60 MG PO CPEP: 60.0000 mg | ORAL_CAPSULE | Freq: Every day | ORAL | 1 refills | Status: DC

## 2021-06-14 MED ORDER — OMEPRAZOLE 40 MG PO CPDR: DELAYED_RELEASE_CAPSULE | ORAL | 1 refills | Status: DC

## 2021-06-14 NOTE — Progress Notes (Signed)
Medical screening examination/treatment was performed by qualified clinical staff member and as supervising physician I was immediately available for consultation/collaboration. I have reviewed documentation and agree with assessment and plan.  Clearnce Sorrel, DNP, APRN, FNP-BC [facility]

## 2021-06-14 NOTE — Patient Instructions (Addendum)
Recombinant Zoster (Shingles) Vaccine: What You Need to Know °1. Why get vaccinated? °Recombinant zoster (shingles) vaccine can prevent shingles. °Shingles (also called herpes zoster, or just zoster) is a painful skin rash, usually with blisters. In addition to the rash, shingles can cause fever, headache, chills, or upset stomach. Rarely, shingles can lead to complications such as pneumonia, hearing problems, blindness, brain inflammation (encephalitis), or death. °The risk of shingles increases with age. The most common complication of shingles is long-term nerve pain called postherpetic neuralgia (PHN). PHN occurs in the areas where the shingles rash was and can last for months or years after the rash goes away. The pain from PHN can be severe and debilitating. °The risk of PHN increases with age. An older adult with shingles is more likely to develop PHN and have longer lasting and more severe pain than a younger person. °People with weakened immune systems also have a higher risk of getting shingles and complications from the disease. °Shingles is caused by varicella-zoster virus, the same virus that causes chickenpox. After you have chickenpox, the virus stays in your body and can cause shingles later in life. Shingles cannot be passed from one person to another, but the virus that causes shingles can spread and cause chickenpox in someone who has never had chickenpox or has never received chickenpox vaccine. °2. Recombinant shingles vaccine °Recombinant shingles vaccine provides strong protection against shingles. By preventing shingles, recombinant shingles vaccine also protects against PHN and other complications. °Recombinant shingles vaccine is recommended for: °Adults 50 years and older °Adults 19 years and older who have a weakened immune system because of disease or treatments °Shingles vaccine is given as a two-dose series. For most people, the second dose should be given 2 to 6 months after the first  dose. Some people who have or will have a weakened immune system can get the second dose 1 to 2 months after the first dose. Ask your health care provider for guidance. °People who have had shingles in the past and people who have received varicella (chickenpox) vaccine are recommended to get recombinant shingles vaccine. The vaccine is also recommended for people who have already gotten another type of shingles vaccine, the live shingles vaccine. There is no live virus in recombinant shingles vaccine. °Shingles vaccine may be given at the same time as other vaccines. °3. Talk with your health care provider °Tell your vaccination provider if the person getting the vaccine: °Has had an allergic reaction after a previous dose of recombinant shingles vaccine, or has any severe, life-threatening allergies °Is currently experiencing an episode of shingles °Is pregnant °In some cases, your health care provider may decide to postpone shingles vaccination until a future visit. °People with minor illnesses, such as a cold, may be vaccinated. People who are moderately or severely ill should usually wait until they recover before getting recombinant shingles vaccine. °Your health care provider can give you more information. °4. Risks of a vaccine reaction °A sore arm with mild or moderate pain is very common after recombinant shingles vaccine. Redness and swelling can also happen at the site of the injection. °Tiredness, muscle pain, headache, shivering, fever, stomach pain, and nausea are common after recombinant shingles vaccine. °These side effects may temporarily prevent a vaccinated person from doing regular activities. Symptoms usually go away on their own in 2 to 3 days. You should still get the second dose of recombinant shingles vaccine even if you had one of these reactions after the first dose. °Guillain-Barré   syndrome (GBS), a serious nervous system disorder, has been reported very rarely after recombinant zoster  vaccine. People sometimes faint after medical procedures, including vaccination. Tell your provider if you feel dizzy or have vision changes or ringing in the ears. As with any medicine, there is a very remote chance of a vaccine causing a severe allergic reaction, other serious injury, or death. 5. What if there is a serious problem? An allergic reaction could occur after the vaccinated person leaves the clinic. If you see signs of a severe allergic reaction (hives, swelling of the face and throat, difficulty breathing, a fast heartbeat, dizziness, or weakness), call 9-1-1 and get the person to the nearest hospital. For other signs that concern you, call your health care provider. Adverse reactions should be reported to the Vaccine Adverse Event Reporting System (VAERS). Your health care provider will usually file this report, or you can do it yourself. Visit the VAERS website at www.vaers.SamedayNews.es or call 223-174-5807. VAERS is only for reporting reactions, and VAERS staff members do not give medical advice. 6. How can I learn more? Ask your health care provider. Call your local or state health department. Visit the website of the Food and Drug Administration (FDA) for vaccine package inserts and additional information at http://lopez-wang.org/. Contact the Centers for Disease Control and Prevention (CDC): Call (440)310-4605 (1-800-CDC-INFO) or Visit CDC's website at http://hunter.com/. Vaccine Information Statement Recombinant Zoster Vaccine (06/25/2020) This information is not intended to replace advice given to you by your health care provider. Make sure you discuss any questions you have with your health care provider. Document Revised: 01/21/2021 Document Reviewed: 07/09/2020 Elsevier Patient Education  [place].   Pneumococcal Polysaccharide Vaccine (PPSV23): What You Need to Know 1. Why get vaccinated? Pneumococcal polysaccharide vaccine (PPSV23) can  prevent pneumococcal disease. Pneumococcal disease refers to any illness caused by pneumococcal bacteria. These bacteria can cause many types of illnesses, including pneumonia, which is an infection of the lungs. Pneumococcal bacteria are one of the most common causes of pneumonia. Besides pneumonia, pneumococcal bacteria can also cause: Ear infections Sinus infections Meningitis (infection of the tissue covering the brain and spinal cord) Bacteremia (bloodstream infection) Anyone can get pneumococcal disease, but children under 26 years of age, people with certain medical conditions, adults 68 years or older, and cigarette smokers are at the highest risk. Most pneumococcal infections are mild. However, some can result in long-term problems, such as brain damage or hearing loss. Meningitis, bacteremia, and pneumonia caused by pneumococcal disease can be fatal. 2. PPSV23 PPSV23 protects against 23 types of bacteria that cause pneumococcal disease. PPSV23 is recommended for: All adults 47 years or older, Anyone 2 years or older with certain medical conditions that can lead to an increased risk for pneumococcal disease. Most people need only one dose of PPSV23. A second dose of PPSV23, and another type of pneumococcal vaccine called PCV13, are recommended for certain high-risk groups. Your health care provider can give you more information. People 65 years or older should get a dose of PPSV23 even if they have already gotten one or more doses of the vaccine before they turned 54. 3. Talk with your health care provider Tell your vaccine provider if the person getting the vaccine: Has had an allergic reaction after a previous dose of PPSV23, or has any severe, life-threatening allergies. In some cases, your health care provider may decide to postpone PPSV23 vaccination to a future visit. People with minor illnesses, such as a cold, may be vaccinated.  People who are moderately or severely ill should  usually wait until they recover before getting PPSV23. Your health care provider can give you more information. 4. Risks of a vaccine reaction Redness or pain where the shot is given, feeling tired, fever, or muscle aches can happen after PPSV23. People sometimes faint after medical procedures, including vaccination. Tell your provider if you feel dizzy or have vision changes or ringing in the ears. As with any medicine, there is a very remote chance of a vaccine causing a severe allergic reaction, other serious injury, or death. 5. What if there is a serious problem? An allergic reaction could occur after the vaccinated person leaves the clinic. If you see signs of a severe allergic reaction (hives, swelling of the face and throat, difficulty breathing, a fast heartbeat, dizziness, or weakness), call 9-1-1 and get the person to the nearest hospital. For other signs that concern you, call your health care provider. Adverse reactions should be reported to the Vaccine Adverse Event Reporting System (VAERS). Your health care provider will usually file this report, or you can do it yourself. Visit the VAERS website at www.vaers.SamedayNews.es or call 7060431436. VAERS is only for reporting reactions, and VAERS staff do not give medical advice. 6. How can I learn more? Ask your health care provider. Call your local or state health department. Contact the Centers for Disease Control and Prevention (CDC): Call 812-819-1302 (1-800-CDC-INFO) or Visit CDC's website at http://hunter.com/ Vaccine Information Statement PPSV23 Vaccine (03/20/2018) This information is not intended to replace advice given to you by your health care provider. Make sure you discuss any questions you have with your health care provider. Document Revised: 01/08/2020 Document Reviewed: 01/09/2020 Elsevier Patient Education  [place].

## 2021-06-14 NOTE — Progress Notes (Addendum)
CC: annual physical, hypertension  HPI: Pleasant 66 y.o. female who  has a past medical history of Anxiety, Asthma (02/21/2018), Back pain, Cancer ([place]), Depression, GERD (gastroesophageal reflux disease), and Hypertension.  she presents to [facility] today, 06/14/21,  for chief complaint of: Annual physical exam  Dentist: not UTD, has not seen in last couple of years Eye exam: not UTD, has not seen in last 5 years Exercise: keeps grandkids daily, very active with them Diet: No restrictions Mammogram: UTD Colon cancer screening:  UTD COVID vaccine:  UTD  Concerns: Followed by podiatry for bone spur excision and had capsulitis in foot in November. Reports still have pain in feet with some pockets of swelling that podiatry is aware of. Reports she is being followed by oncology for history of PE and has a follow up in a few months to see if she is able to come off of Eliquis. States she has been having trouble staying asleep at night. Some nights she sleeps through the night, but some nights she will wake up at 0230 and have trouble falling back to sleep.   Past medical, surgical, social and family history reviewed:  Social History   Tobacco Use   Smoking status: Never    Passive exposure: Yes   Smokeless tobacco: Never  Substance Use Topics   Alcohol use: Yes    Comment: occasionally    Family History  Problem Relation Age of Onset   Cancer Mother        lung   Cancer Father        lung   Hypertension Father    Diabetes Father    Breast cancer Sister 22   Breast cancer Maternal Aunt    Colon cancer Maternal Aunt    Esophageal cancer Maternal Uncle    Breast cancer Cousin    Non-Hodgkin's lymphoma Son    Rectal cancer Neg Hx    Stomach cancer Neg Hx      Current medication list and allergy/intolerance information reviewed:    Current Outpatient Medications  Medication Sig Dispense Refill   albuterol (VENTOLIN HFA) 108 (90 Base)  MCG/ACT inhaler Inhale 2 puffs into the lungs every 6 (six) hours as needed for wheezing or shortness of breath. 1 each 1   apixaban (ELIQUIS) 5 MG TABS tablet Take 2.5 mg by mouth 2 (two) times daily.     CASCARA SAGRADA PO Take 1 capsule by mouth at bedtime.     Cholecalciferol (VITAMIN D) 125 MCG (5000 UT) CAPS Take 5,000 Units by mouth daily.     pregabalin (LYRICA) 75 MG capsule Take 1 capsule (75 mg total) by mouth 3 (three) times daily. 90 capsule 2   vitamin E 1000 UNIT capsule Take 1,000 Units by mouth daily.     buPROPion (WELLBUTRIN XL) 300 MG 24 hr tablet Take 1 tablet (300 mg total) by mouth daily. 90 tablet 1   celecoxib (CELEBREX) 100 MG capsule TAKE ONE TO TWO CAPSULES BY MOUTH TWICE A DAY 180 capsule 1   DULoxetine (CYMBALTA) 60 MG capsule Take 1 capsule (60 mg total) by mouth daily. 90 capsule 1   estradiol (ESTRACE) 1 MG tablet Take 1 tablet (1 mg total) by mouth daily. 90 tablet 1   omeprazole (PRILOSEC) 40 MG capsule TAKE 1 CAPSULE BY MOUTH IN THE MORNING AND AT BEDTIME. 180 capsule 1   No current facility-administered medications for this visit.    No Known Allergies  Exam:  BP (!) 141/85    Pulse 80    Temp 97.7 F (36.5 C)    Ht 5' 2.75" (1.594 m)    Wt 97.8 kg    SpO2 99%    BMI 38.51 kg/m  Constitutional: VS see above. General Appearance: alert, well-developed, well-nourished, NAD Eyes: Normal lids and conjunctive Ears, Nose, Mouth, Throat: MMM,. TM normal bilaterally.  Neck: No masses, trachea midline. No thyroid enlargement. No tenderness/mass appreciated. No lymphadenopathy Respiratory: Normal respiratory effort. no wheeze, no rhonchi, no rales Cardiovascular: S1/S2 normal, no murmur, no rub/gallop auscultated. RRR. No lower extremity edema. Pedal pulse II/IV bilaterally DP and PT. No carotid bruit or JVD. No abdominal aortic bruit. Gastrointestinal: Nontender, no masses. No hepatomegaly, no splenomegaly. No hernia appreciated. Bowel sounds normal. Rectal  exam deferred.  Musculoskeletal: Gait normal. No clubbing/cyanosis of digits.  Neurological: Normal balance/coordination. No tremor. No cranial nerve deficit on limited exam. Motor and sensation intact and symmetric. Cerebellar reflexes intact.  Skin: warm, dry, intact. No rash/ulcer. No concerning nevi or subq nodules on limited exam.   Psychiatric: Normal judgment/insight. Normal mood and affect. Oriented x3.    ASSESSMENT/PLAN:   1. Annual physical exam Routine lab work today.  - CBC with Differential/Platelet - Lipid panel  2. Hypertension, unspecified type Obtaining lab work today. Denies CP, SOB, dizziness, palpitations . - CBC with Differential/Platelet  3. Need for shingles vaccine Received vaccine in office. Follow up in 2-6 months for second dose - Varicella-zoster vaccine IM  4. Need for pneumococcal vaccination Received vaccine in office today  - Pneumococcal conjugate vaccine 20-valent  5. Need for hepatitis C screening test Obtaining lab work today - Hepatitis C antibody  6. Insomnia, unspecified Discussed sleep hygiene habits, guided imaging, melatonin/herbal teas.   Orders Placed This Encounter  Procedures   Varicella-zoster vaccine IM   Pneumococcal conjugate vaccine 20-valent   Hepatitis C antibody   CBC with Differential/Platelet   Lipid panel    Meds ordered this encounter  Medications   buPROPion (WELLBUTRIN XL) 300 MG 24 hr tablet    Sig: Take 1 tablet (300 mg total) by mouth daily.    Dispense:  90 tablet    Refill:  1    Order Specific Question:   Supervising Provider    Answer:   MATTHEWS, CODY [[id number]]   celecoxib (CELEBREX) 100 MG capsule    Sig: TAKE ONE TO TWO CAPSULES BY MOUTH TWICE A DAY    Dispense:  180 capsule    Refill:  1    Order Specific Question:   Supervising Provider    Answer:   MATTHEWS, CODY [[id number]]   DULoxetine (CYMBALTA) 60 MG capsule    Sig: Take 1 capsule (60 mg total) by mouth daily.    Dispense:  90 capsule     Refill:  1    Order Specific Question:   Supervising Provider    Answer:   MATTHEWS, CODY [[id number]]   estradiol (ESTRACE) 1 MG tablet    Sig: Take 1 tablet (1 mg total) by mouth daily.    Dispense:  90 tablet    Refill:  1    Order Specific Question:   Supervising Provider    Answer:   MATTHEWS, CODY [[id number]]   omeprazole (PRILOSEC) 40 MG capsule    Sig: TAKE 1 CAPSULE BY MOUTH IN THE MORNING AND AT BEDTIME.    Dispense:  180 capsule    Refill:  1    Order Specific Question:  Supervising Provider    Answer:   MATTHEWS, CODY [[id number]]   pregabalin (LYRICA) 75 MG capsule    Sig: Take 1 capsule (75 mg total) by mouth 3 (three) times daily.    Dispense:  90 capsule    Refill:  2    Order Specific Question:   Supervising Provider    Answer:   Zigmund Daniel, CODY [[id number]]    Patient Instructions  Recombinant Zoster (Shingles) Vaccine: What You Need to Know 1. Why get vaccinated? Recombinant zoster (shingles) vaccine can prevent shingles. Shingles (also called herpes zoster, or just zoster) is a painful skin rash, usually with blisters. In addition to the rash, shingles can cause fever, headache, chills, or upset stomach. Rarely, shingles can lead to complications such as pneumonia, hearing problems, blindness, brain inflammation (encephalitis), or death. The risk of shingles increases with age. The most common complication of shingles is long-term nerve pain called postherpetic neuralgia (PHN). PHN occurs in the areas where the shingles rash was and can last for months or years after the rash goes away. The pain from PHN can be severe and debilitating. The risk of PHN increases with age. An older adult with shingles is more likely to develop PHN and have longer lasting and more severe pain than a younger person. People with weakened immune systems also have a higher risk of getting shingles and complications from the disease. Shingles is caused by varicella-zoster virus, the same virus that causes  chickenpox. After you have chickenpox, the virus stays in your body and can cause shingles later in life. Shingles cannot be passed from one person to another, but the virus that causes shingles can spread and cause chickenpox in someone who has never had chickenpox or has never received chickenpox vaccine. 2. Recombinant shingles vaccine Recombinant shingles vaccine provides strong protection against shingles. By preventing shingles, recombinant shingles vaccine also protects against PHN and other complications. Recombinant shingles vaccine is recommended for: Adults 23 years and older Adults 19 years and older who have a weakened immune system because of disease or treatments Shingles vaccine is given as a two-dose series. For most people, the second dose should be given 2 to 6 months after the first dose. Some people who have or will have a weakened immune system can get the second dose 1 to 2 months after the first dose. Ask your health care provider for guidance. People who have had shingles in the past and people who have received varicella (chickenpox) vaccine are recommended to get recombinant shingles vaccine. The vaccine is also recommended for people who have already gotten another type of shingles vaccine, the live shingles vaccine. There is no live virus in recombinant shingles vaccine. Shingles vaccine may be given at the same time as other vaccines. 3. Talk with your health care provider Tell your vaccination provider if the person getting the vaccine: Has had an allergic reaction after a previous dose of recombinant shingles vaccine, or has any severe, life-threatening allergies Is currently experiencing an episode of shingles Is pregnant In some cases, your health care provider may decide to postpone shingles vaccination until a future visit. People with minor illnesses, such as a cold, may be vaccinated. People who are moderately or severely ill should usually wait until they recover  before getting recombinant shingles vaccine. Your health care provider can give you more information. 4. Risks of a vaccine reaction A sore arm with mild or moderate pain is very common after recombinant shingles vaccine. Redness and swelling can  also happen at the site of the injection. Tiredness, muscle pain, headache, shivering, fever, stomach pain, and nausea are common after recombinant shingles vaccine. These side effects may temporarily prevent a vaccinated person from doing regular activities. Symptoms usually go away on their own in 2 to 3 days. You should still get the second dose of recombinant shingles vaccine even if you had one of these reactions after the first dose. Guillain-Barr syndrome (GBS), a serious nervous system disorder, has been reported very rarely after recombinant zoster vaccine. People sometimes faint after medical procedures, including vaccination. Tell your provider if you feel dizzy or have vision changes or ringing in the ears. As with any medicine, there is a very remote chance of a vaccine causing a severe allergic reaction, other serious injury, or death. 5. What if there is a serious problem? An allergic reaction could occur after the vaccinated person leaves the clinic. If you see signs of a severe allergic reaction (hives, swelling of the face and throat, difficulty breathing, a fast heartbeat, dizziness, or weakness), call 9-1-1 and get the person to the nearest hospital. For other signs that concern you, call your health care provider. Adverse reactions should be reported to the Vaccine Adverse Event Reporting System (VAERS). Your health care provider will usually file this report, or you can do it yourself. Visit the VAERS website at www.vaers.SamedayNews.es or call 336-064-6908. VAERS is only for reporting reactions, and VAERS staff members do not give medical advice. 6. How can I learn more? Ask your health care provider. Call your local or state health  department. Visit the website of the Food and Drug Administration (FDA) for vaccine package inserts and additional information at http://lopez-wang.org/. Contact the Centers for Disease Control and Prevention (CDC): Call 479-547-6296 (1-800-CDC-INFO) or Visit CDC's website at http://hunter.com/. Vaccine Information Statement Recombinant Zoster Vaccine (06/25/2020) This information is not intended to replace advice given to you by your health care provider. Make sure you discuss any questions you have with your health care provider. Document Revised: 01/21/2021 Document Reviewed: 07/09/2020 Elsevier Patient Education  [place].   Pneumococcal Polysaccharide Vaccine (PPSV23): What You Need to Know 1. Why get vaccinated? Pneumococcal polysaccharide vaccine (PPSV23) can prevent pneumococcal disease. Pneumococcal disease refers to any illness caused by pneumococcal bacteria. These bacteria can cause many types of illnesses, including pneumonia, which is an infection of the lungs. Pneumococcal bacteria are one of the most common causes of pneumonia. Besides pneumonia, pneumococcal bacteria can also cause: Ear infections Sinus infections Meningitis (infection of the tissue covering the brain and spinal cord) Bacteremia (bloodstream infection) Anyone can get pneumococcal disease, but children under 60 years of age, people with certain medical conditions, adults 23 years or older, and cigarette smokers are at the highest risk. Most pneumococcal infections are mild. However, some can result in long-term problems, such as brain damage or hearing loss. Meningitis, bacteremia, and pneumonia caused by pneumococcal disease can be fatal. 2. PPSV23 PPSV23 protects against 23 types of bacteria that cause pneumococcal disease. PPSV23 is recommended for: All adults 25 years or older, Anyone 2 years or older with certain medical conditions that can lead to an increased risk for  pneumococcal disease. Most people need only one dose of PPSV23. A second dose of PPSV23, and another type of pneumococcal vaccine called PCV13, are recommended for certain high-risk groups. Your health care provider can give you more information. People 65 years or older should get a dose of PPSV23 even if they have already gotten  one or more doses of the vaccine before they turned 32. 3. Talk with your health care provider Tell your vaccine provider if the person getting the vaccine: Has had an allergic reaction after a previous dose of PPSV23, or has any severe, life-threatening allergies. In some cases, your health care provider may decide to postpone PPSV23 vaccination to a future visit. People with minor illnesses, such as a cold, may be vaccinated. People who are moderately or severely ill should usually wait until they recover before getting PPSV23. Your health care provider can give you more information. 4. Risks of a vaccine reaction Redness or pain where the shot is given, feeling tired, fever, or muscle aches can happen after PPSV23. People sometimes faint after medical procedures, including vaccination. Tell your provider if you feel dizzy or have vision changes or ringing in the ears. As with any medicine, there is a very remote chance of a vaccine causing a severe allergic reaction, other serious injury, or death. 5. What if there is a serious problem? An allergic reaction could occur after the vaccinated person leaves the clinic. If you see signs of a severe allergic reaction (hives, swelling of the face and throat, difficulty breathing, a fast heartbeat, dizziness, or weakness), call 9-1-1 and get the person to the nearest hospital. For other signs that concern you, call your health care provider. Adverse reactions should be reported to the Vaccine Adverse Event Reporting System (VAERS). Your health care provider will usually file this report, or you can do it yourself. Visit the  VAERS website at www.vaers.SamedayNews.es or call 567 714 6509. VAERS is only for reporting reactions, and VAERS staff do not give medical advice. 6. How can I learn more? Ask your health care provider. Call your local or state health department. Contact the Centers for Disease Control and Prevention (CDC): Call 612 467 8147 (1-800-CDC-INFO) or Visit CDC's website at http://hunter.com/ Vaccine Information Statement PPSV23 Vaccine (03/20/2018) This information is not intended to replace advice given to you by your health care provider. Make sure you discuss any questions you have with your health care provider. Document Revised: 01/08/2020 Document Reviewed: 01/09/2020 Elsevier Patient Education  [place].    Follow-up plan: Return in about 6 months (around 12/12/2021) for Chronic disease follow up with second Shingrix vaccine.  Jeanann Lewandowsky, Student NP

## 2021-06-15 LAB — CBC WITH DIFFERENTIAL/PLATELET
Absolute Monocytes: 429 cells/uL (ref 200–950)
Basophils Absolute: 21 cells/uL (ref 0–200)
Basophils Relative: 0.4 %
Eosinophils Absolute: 90 cells/uL (ref 15–500)
Eosinophils Relative: 1.7 %
HCT: 43.6 % (ref 35.0–45.0)
Hemoglobin: 14.6 g/dL (ref 11.7–15.5)
Lymphs Abs: 2417 cells/uL (ref 850–3900)
MCH: 32.7 pg (ref 27.0–33.0)
MCHC: 33.5 g/dL (ref 32.0–36.0)
MCV: 97.8 fL (ref 80.0–100.0)
MPV: 10 fL (ref 7.5–12.5)
Monocytes Relative: 8.1 %
Neutro Abs: 2343 cells/uL (ref 1500–7800)
Neutrophils Relative %: 44.2 %
Platelets: 233 10*3/uL (ref 140–400)
RBC: 4.46 10*6/uL (ref 3.80–5.10)
RDW: 12.8 % (ref 11.0–15.0)
Total Lymphocyte: 45.6 %
WBC: 5.3 10*3/uL (ref 3.8–10.8)

## 2021-06-15 LAB — HEPATITIS C ANTIBODY
Hepatitis C Ab: NONREACTIVE
SIGNAL TO CUT-OFF: 0.08 (ref <1.00)

## 2021-06-15 LAB — LIPID PANEL
Cholesterol: 201 mg/dL — ABNORMAL HIGH (ref <200)
HDL: 89 mg/dL (ref >=50)
LDL Cholesterol (Calc): 98 mg/dL (calc)
Non-HDL Cholesterol (Calc): 112 mg/dL (calc) (ref <130)
Total CHOL/HDL Ratio: 2.3 (calc) (ref <5.0)
Triglycerides: 59 mg/dL (ref <150)

## 2021-06-23 ENCOUNTER — Other Ambulatory Visit: Payer: Self-pay

## 2021-06-23 ENCOUNTER — Ambulatory Visit: Payer: PPO | Admitting: Podiatry

## 2021-06-23 DIAGNOSIS — M778 Other enthesopathies, not elsewhere classified: Secondary | ICD-10-CM

## 2021-06-23 DIAGNOSIS — M7752 Other enthesopathy of left foot: Secondary | ICD-10-CM | POA: Diagnosis not present

## 2021-06-25 NOTE — Progress Notes (Signed)
Subjective: 66 year old female presents to the office today for concerns of discomfort to her foot, ankle.  She states that she still having pain on the top of her foot as well as the top, side of her foot.  She gets the knots on the top of her foot as well as cause discomfort.  No recent injury or changes.  She has tried the oral steroids as well as orthotics without significant provement.  Also her Lyrica is recently been increased to 75 mg 3 times a day.  No recent injury or changes otherwise.  No other concerns.    Objective: AAO x3, NAD DP/PT pulses palpable bilaterally, CRT less than 3 seconds Tenderness palpation on the dorsal medial aspect of the midfoot as well as the dorsal central aspect.  Incision from prior surgery is well-healed.  There is localized edema.  No erythema or warmth.  Flexor, extensor tendons appear to be intact.  MMT 5/5. No pain with calf compression, swelling, warmth, erythema  Assessment: Right foot capsulitis, arthritis; tendonitis ankle  Plan: -All treatment options discussed with the patient including all alternatives, risks, complications.  -At this point she is tried oral steroids as well as orthotics without significant improvement.  We also previously had surgery to remove an osteophyte.  We discussed with conservative as well as surgical options.  She does not want to proceed with surgery but symptoms progress may consider midfoot arthrodesis.  We discussed another steroid injection she wants to proceed with this today.  Discussed risks of repeated injection. I prepped the skin with alcohol and mixture of 1 cc dexamethasone phosphate, 1 cc Marcaine plain was infiltrated into the area of tenderness on the dorsal aspect of the foot.  Postinjection care was discussed.  Recommend her to wear the cam boot for the next several days to help prevent any complications. -I will have her follow-up with our orthotist, Aaron Edelman for orthotic modifications.  He will consider  graphite insert as well. -Referral to physical therapy.  Consider possible dry needling to see if this will be helpful as I think some of her issues are still some tendinitis in conjunction with the arthritis in her foot. -Continue Lyrica. -Patient encouraged to call the office with any questions, concerns, change in symptoms.   Trula Slade DPM

## 2021-06-27 ENCOUNTER — Encounter: Payer: Self-pay | Admitting: Podiatry

## 2021-07-06 ENCOUNTER — Encounter: Payer: Self-pay | Admitting: Physical Therapy

## 2021-07-06 ENCOUNTER — Other Ambulatory Visit: Payer: Self-pay

## 2021-07-06 ENCOUNTER — Ambulatory Visit: Payer: PPO | Attending: Podiatry | Admitting: Physical Therapy

## 2021-07-06 DIAGNOSIS — R262 Difficulty in walking, not elsewhere classified: Secondary | ICD-10-CM | POA: Diagnosis not present

## 2021-07-06 DIAGNOSIS — R29898 Other symptoms and signs involving the musculoskeletal system: Secondary | ICD-10-CM | POA: Diagnosis not present

## 2021-07-06 DIAGNOSIS — M7752 Other enthesopathy of left foot: Secondary | ICD-10-CM | POA: Diagnosis not present

## 2021-07-06 DIAGNOSIS — M79672 Pain in left foot: Secondary | ICD-10-CM | POA: Insufficient documentation

## 2021-07-06 NOTE — Patient Instructions (Signed)
Access Code: [email or web address] URL: [email or web address] Date: 07/06/2021 Prepared by: Isabelle Course  Exercises Seated Toe Towel Scrunches - 1 x daily - 7 x weekly - 3 sets - 10 reps Ankle Inversion Eversion Towel Slide - 1 x daily - 7 x weekly - 2 sets - 10 reps Soleus Stretch on Wall - 1 x daily - 7 x weekly - 1 sets - 3 reps - 20-30 sec hold Gastroc Stretch on Wall - 1 x daily - 7 x weekly - 1 sets - 3 reps - 20-30 seconds hold Long Sitting Ankle Eversion with Resistance - 1 x daily - 7 x weekly - 3 sets - 10 reps Long Sitting Ankle Plantar Flexion with Resistance - 1 x daily - 7 x weekly - 3 sets - 10 reps Long Sitting Ankle Inversion with Resistance - 1 x daily - 7 x weekly - 3 sets - 10 reps Long Sitting Ankle Dorsiflexion with Anchored Resistance - 1 x daily - 7 x weekly - 3 sets - 10 reps

## 2021-07-06 NOTE — Therapy (Signed)
[place], [place], [address] Phone: [phone]   Fax:  [phone]  Physical Therapy Evaluation  Patient Details  Name: [name] MRN: [id number] Date of Birth: 08-01-1955 Referring Provider (PT): Celesta Gentile   Encounter Date: 07/06/2021   PT End of Session - 07/06/21 1005     Visit Number 1    Number of Visits 6    Date for PT Re-Evaluation 08/17/21    Authorization Type [employer]    PT Start Time 0930    PT Stop Time 1005    PT Time Calculation (min) 35 min    Activity Tolerance Patient tolerated treatment well    Behavior During Therapy [facility] for tasks assessed/performed             Past Medical History:  Diagnosis Date   Anxiety    Asthma 02/21/2018   Back pain    Cancer ([place])    Depression    GERD (gastroesophageal reflux disease)    Hypertension     Past Surgical History:  Procedure Laterality Date   ABDOMINAL HYSTERECTOMY     ARTHROSCOPY WITH ANTERIOR CRUCIATE LIGAMENT (ACL) REPAIR WITH ANTERIOR TIBILIAS GRAFT     BACK SURGERY     EXCISION MASS ABDOMINAL Left 11/12/2020   Procedure: EXCISION LEFT MASS ABDOMINAL;  Surgeon: Kieth Brightly, Arta Bruce, MD;  Location: [facility];  Service: General;  Laterality: Left;   SHOULDER ARTHROSCOPY W/ ROTATOR CUFF REPAIR     SHOULDER OPEN ROTATOR CUFF REPAIR     TUBAL LIGATION      There were no vitals filed for this visit.    Subjective Assessment - 07/06/21 0929     Subjective Pt had Lt ankle surgery to remove bone spurs 12/22/2020. She states that since the surgery she has been having continued Lt ankle pain. She has had cortisone shots and custom orthotics but continues to have pain. Pt states she would like to avoid having another surgery. Pt states her LT ankle hurts all the time and gets worse at the end of the day after standing or walking for a long time. Pain decreases with elevation and rest.    Pertinent History  Lt ankle surgery 12/22/2020    Limitations Standing;Walking    How long can you stand comfortably? 30 minutes    How long can you walk comfortably? 5 minutes    Diagnostic tests MRI shows: 1. Severe tenosynovitis of the extensor digitorum tendon sheath.  2. Longitudinal split tear of the peroneus brevis with mild  tenosynovitis.  3. Severe osteoarthritis of the navicular-cuneiform joints with  severe subchondral marrow edema and cystic changes.  4. Mild plantar fasciitis of the medial band of the plantar fascia.    Patient Stated Goals decrease pain    Currently in Pain? Yes    Pain Score 6     Pain Location Foot    Pain Orientation Left    Pain Descriptors / Indicators Aching;Sharp    Pain Type Chronic pain;Surgical pain    Pain Onset More than a month ago    Pain Frequency Constant    Aggravating Factors  stand, walk    Pain Relieving Factors rest, elevation                [facility] PT Assessment - 07/06/21 0001       Assessment   Medical Diagnosis Tendonitis of Lt ankle    Referring Provider (PT) Celesta Gentile  Onset Date/Surgical Date 12/22/20    Next MD Visit 08/18/21    Prior Therapy none      Precautions   Precautions None      Restrictions   Weight Bearing Restrictions No      Balance Screen   Has the patient fallen in the past 6 months No    Has the patient had a decrease in activity level because of a fear of falling?  No    Is the patient reluctant to leave their home because of a fear of falling?  No      Prior Function   Level of Independence Independent    Vocation Requirements watches her young grandchildren a few days a week      Observation/Other Assessments   Focus on Therapeutic Outcomes (FOTO)  54      ROM / Strength   AROM / PROM / Strength AROM;Strength      AROM   AROM Assessment Site Ankle    Right/Left Ankle Left    Left Ankle Dorsiflexion -3    Left Ankle Plantar Flexion 47    Left Ankle Inversion 11    Left Ankle Eversion 9       Strength   Strength Assessment Site Ankle    Right/Left Ankle Right;Left    Right Ankle Dorsiflexion 4/5    Right Ankle Plantar Flexion 4/5    Right Ankle Inversion 4/5    Right Ankle Eversion 4/5    Left Ankle Dorsiflexion 4-/5    Left Ankle Plantar Flexion 4-/5    Left Ankle Inversion 3+/5    Left Ankle Eversion 3/5      Palpation   Palpation comment hyopmobile navicular  mobs and 1st metatarsal. some TTP medial plantar fascia                        Objective measurements completed on examination: See above findings.       [place] Adult PT Treatment/Exercise - 07/06/21 0001       Exercises   Exercises Ankle      Modalities   Modalities --   pt states she will ice at home     Ankle Exercises: Stretches   Soleus Stretch 30 seconds    Gastroc Stretch 30 seconds      Ankle Exercises: Seated   Towel Crunch 5 reps    Towel Inversion/Eversion 5 reps      Ankle Exercises: Supine   T-Band ankle 4 way red TB x 10 in long sitting                     PT Education - 07/06/21 0956     Education Details PT POC and goals, HEP    Person(s) Educated Patient    Methods Explanation;Demonstration;Handout    Comprehension Returned demonstration;Verbalized understanding                 PT Long Term Goals - 07/06/21 1011       PT LONG TERM GOAL #1   Title Pt will be independent with HEP    Time 6    Period Weeks    Status New    Target Date 08/17/21      PT LONG TERM GOAL #2   Title Pt will improve FOTO to 64 to demo improved functional mobility    Time 6    Period Weeks    Status New    Target Date  08/17/21      PT LONG TERM GOAL #3   Title Pt will tolerate walking x 10 minutes with pain <= 2/10    Time 6    Period Weeks    Status New    Target Date 08/17/21                    Plan - 07/06/21 1006     Clinical Impression Statement Pt is a 66 y/o female referred for Lt ankle tendonitis. Pt presents with decreased  mobility and activity tolerance, decreased strength and ROM and difficulty walking. Pt will benefit from skilled PT to address deficits and improve functional mobility    Personal Factors and Comorbidities Age;Past/Current Experience;Time since onset of injury/illness/exacerbation    Examination-Activity Limitations Locomotion Level;Stand;Stairs    Examination-Participation Restrictions Community Activity    Stability/Clinical Decision Making Stable/Uncomplicated    Clinical Decision Making Low    Rehab Potential Good    PT Frequency 1x / week    PT Duration 6 weeks    PT Treatment/Interventions Cryotherapy;Aquatic Therapy;Iontophoresis 4mg /ml Dexamethasone;Electrical Stimulation;Moist Heat;Ultrasound;Stair training;Gait training;Balance training;Therapeutic exercise;Neuromuscular re-education;Therapeutic activities;Patient/family education;Manual techniques;Passive range of motion;Dry needling;Taping;Vasopneumatic Device    PT Next Visit Plan assess HEP, foot and ankle strength and mobility    PT Home Exercise Plan [id number]    Consulted and Agree with Plan of Care Patient             Patient will benefit from skilled therapeutic intervention in order to improve the following deficits and impairments:  Pain, Decreased endurance, Decreased balance, Difficulty walking, Decreased strength, Decreased activity tolerance, Decreased range of motion, Increased edema  Visit Diagnosis: Pain in left foot - Plan: PT plan of care cert/re-cert  Difficulty in walking, not elsewhere classified - Plan: PT plan of care cert/re-cert  Other symptoms and signs involving the musculoskeletal system - Plan: PT plan of care cert/re-cert     Problem List Patient Active Problem List   Diagnosis Date Noted   At risk for obstructive sleep apnea 03/26/2020   History of pulmonary embolus (PE) 03/26/2020   Basal cell carcinoma (BCC) of right ala nasi 02/21/2019   Basal cell carcinoma (BCC) of right upper  eyelid 02/07/2019   Dysphagia 10/30/2018   History of esophageal dilatation 10/30/2018   Chronic right shoulder pain 10/15/2018   Asthma 02/21/2018   Paralyzed hemidiaphragm 02/21/2018   Elevated hemidiaphragm 02/05/2018   Atelectasis 02/05/2018   Anxiety 02/01/2018   Cervical neck pain with evidence of disc disease 02/01/2018   BMI 38.0-38.9,adult 02/01/2018   Cataract 04/01/2015   Depression 04/01/2015   Hyperlipidemia 04/01/2015   Hypertension 04/01/2015   Disorder of intervertebral disc of cervical spine 04/01/2015   Lumbar disc disease 04/01/2015   Hormone replacement therapy (postmenopausal) 07/21/2014   Fatty (change of) liver, not elsewhere classified 05/29/2012    Ronal Maybury, PT 07/06/2021, 10:56 AM  [facility] [address] [place], [place], [address] Phone: [phone]   Fax:  [phone]  Name: [name] MRN: [id number] Date of Birth: Jul 13, 1955

## 2021-07-07 ENCOUNTER — Encounter: Payer: Self-pay | Admitting: Podiatry

## 2021-07-07 ENCOUNTER — Other Ambulatory Visit: Payer: Self-pay | Admitting: Medical-Surgical

## 2021-07-07 NOTE — Telephone Encounter (Signed)
Please advise 

## 2021-07-07 NOTE — Telephone Encounter (Signed)
Can one of you please call to make an appointment with [name] for orthotics. Also, can you mail her a handicap paperwork? Thanks!

## 2021-07-08 NOTE — Telephone Encounter (Signed)
Patient scheduled for 07/14/21 with Aaron Edelman Little .

## 2021-07-14 ENCOUNTER — Other Ambulatory Visit: Payer: Self-pay

## 2021-07-14 ENCOUNTER — Ambulatory Visit: Payer: PPO

## 2021-07-14 ENCOUNTER — Telehealth: Payer: Self-pay

## 2021-07-14 DIAGNOSIS — M778 Other enthesopathies, not elsewhere classified: Secondary | ICD-10-CM

## 2021-07-14 NOTE — Telephone Encounter (Signed)
Casts sent to [employer] ?

## 2021-07-14 NOTE — Progress Notes (Signed)
SITUATION Reason for Consult: Evaluation for Bilateral Custom Foot Orthoses Patient / Caregiver Report: Patient is ready for foot orthotics  OBJECTIVE DATA: Patient History / Diagnosis:    ICD-10-CM   1. Capsulitis of foot, left  M77.8       Current or Previous Devices: Historical user  Foot Examination: Skin presentation:   Intact Ulcers & [place]:   None and no history Toe / Foot Deformities:  Hammertoes, bunion Weight Bearing Presentation:  Rectus Sensation:    Intact  Shoe Size: 9.76M  ORTHOTIC RECOMMENDATION Recommended Device: 1x pair of custom functional foot orthotics  GOALS OF ORTHOSES - Reduce Pain - Prevent Foot Deformity - Prevent Progression of Further Foot Deformity - Relieve Pressure - Improve the Overall Biomechanical Function of the Foot and Lower Extremity.  ACTIONS PERFORMED Patient was casted for Foot Orthoses via crush box. Procedure was explained and patient tolerated procedure well. All questions were answered and concerns addressed.  PLAN Potential out of pocket cost was communicated to patient. Casts are to be sent to [facility] for fabrication. Patient is to be called for fitting when devices are ready.

## 2021-07-15 ENCOUNTER — Ambulatory Visit: Payer: PPO | Admitting: Physical Therapy

## 2021-07-15 DIAGNOSIS — R29898 Other symptoms and signs involving the musculoskeletal system: Secondary | ICD-10-CM

## 2021-07-15 DIAGNOSIS — R262 Difficulty in walking, not elsewhere classified: Secondary | ICD-10-CM

## 2021-07-15 DIAGNOSIS — M79672 Pain in left foot: Secondary | ICD-10-CM

## 2021-07-15 DIAGNOSIS — M7752 Other enthesopathy of left foot: Secondary | ICD-10-CM | POA: Diagnosis not present

## 2021-07-15 NOTE — Therapy (Signed)
[place], [place], [address] Phone: [phone]   Fax:  [phone]  Physical Therapy Treatment  Patient Details  Name: [name] MRN: [id number] Date of Birth: 06-08-1955 Referring Provider (PT): Celesta Gentile   Encounter Date: 07/15/2021   PT End of Session - 07/15/21 1001     Visit Number 2    Number of Visits 6    Date for PT Re-Evaluation 08/17/21    Authorization Type [employer]    PT Start Time 0930    PT Stop Time 1010    PT Time Calculation (min) 40 min    Activity Tolerance Patient tolerated treatment well    Behavior During Therapy [facility] for tasks assessed/performed             Past Medical History:  Diagnosis Date   Anxiety    Asthma 02/21/2018   Back pain    Cancer ([place])    Depression    GERD (gastroesophageal reflux disease)    Hypertension     Past Surgical History:  Procedure Laterality Date   ABDOMINAL HYSTERECTOMY     ARTHROSCOPY WITH ANTERIOR CRUCIATE LIGAMENT (ACL) REPAIR WITH ANTERIOR TIBILIAS GRAFT     BACK SURGERY     EXCISION MASS ABDOMINAL Left 11/12/2020   Procedure: EXCISION LEFT MASS ABDOMINAL;  Surgeon: Kieth Brightly, Arta Bruce, MD;  Location: [facility];  Service: General;  Laterality: Left;   SHOULDER ARTHROSCOPY W/ ROTATOR CUFF REPAIR     SHOULDER OPEN ROTATOR CUFF REPAIR     TUBAL LIGATION      There were no vitals filed for this visit.   Subjective Assessment - 07/15/21 0933     Subjective Pt states that she feels her flexibility is better but that she still is having pain. She is having new tingling into her Lt toes    Patient Stated Goals decrease pain    Currently in Pain? Yes    Pain Score 6     Pain Location Foot    Pain Orientation Left    Pain Descriptors / Indicators Tingling;Sharp;Aching    Pain Type Chronic pain;Surgical pain                [facility] PT Assessment - 07/15/21 0001       Assessment   Medical  Diagnosis Tendonitis of Lt ankle    Referring Provider (PT) Celesta Gentile    Onset Date/Surgical Date 12/22/20    Next MD Visit 08/18/21    Prior Therapy none      Palpation   Palpation comment hyopmobile navicular  mobs and 1st metatarsal. some TTP medial plantar fascia                           [facility] Adult PT Treatment/Exercise - 07/15/21 0001       Modalities   Modalities Vasopneumatic      Vasopneumatic   Number Minutes Vasopneumatic  10 minutes    Vasopnuematic Location  Ankle    Vasopneumatic Pressure Low    Vasopneumatic Temperature  34      Manual Therapy   Manual Therapy Joint mobilization;Soft tissue mobilization    Joint Mobilization 1st metatarsal, navicular, TC mobs to improve mobility and decrease pain    Soft tissue mobilization STM plantar fascia to reduce pain and improve mobility      Ankle Exercises: Aerobic   Nustep 5 mins level 5 for warm up  Ankle Exercises: Seated   Towel Crunch --   2 min   Towel Inversion/Eversion --   20 reps   Other Seated Ankle Exercises rocker board PF/DF x 1 min      Ankle Exercises: Supine   T-Band ankle 4 way red TB x 20                          PT Long Term Goals - 07/06/21 1011       PT LONG TERM GOAL #1   Title Pt will be independent with HEP    Time 6    Period Weeks    Status New    Target Date 08/17/21      PT LONG TERM GOAL #2   Title Pt will improve FOTO to 64 to demo improved functional mobility    Time 6    Period Weeks    Status New    Target Date 08/17/21      PT LONG TERM GOAL #3   Title Pt will tolerate walking x 10 minutes with pain <= 2/10    Time 6    Period Weeks    Status New    Target Date 08/17/21                   Plan - 07/15/21 1002     Clinical Impression Statement Pt continues with pain but states she feels she has improved flexibility. Trial of vaso for compression and PT educated pt on wearing compression sock to decrease  ankle swelling    PT Next Visit Plan foot and ankle strength and mobility, manual/modalities as indicated    PT Home Exercise Plan [id number]    Consulted and Agree with Plan of Care Patient             Patient will benefit from skilled therapeutic intervention in order to improve the following deficits and impairments:     Visit Diagnosis: Pain in left foot  Difficulty in walking, not elsewhere classified  Other symptoms and signs involving the musculoskeletal system     Problem List Patient Active Problem List   Diagnosis Date Noted   At risk for obstructive sleep apnea 03/26/2020   History of pulmonary embolus (PE) 03/26/2020   Basal cell carcinoma (BCC) of right ala nasi 02/21/2019   Basal cell carcinoma (BCC) of right upper eyelid 02/07/2019   Dysphagia 10/30/2018   History of esophageal dilatation 10/30/2018   Chronic right shoulder pain 10/15/2018   Asthma 02/21/2018   Paralyzed hemidiaphragm 02/21/2018   Elevated hemidiaphragm 02/05/2018   Atelectasis 02/05/2018   Anxiety 02/01/2018   Cervical neck pain with evidence of disc disease 02/01/2018   BMI 38.0-38.9,adult 02/01/2018   Cataract 04/01/2015   Depression 04/01/2015   Hyperlipidemia 04/01/2015   Hypertension 04/01/2015   Disorder of intervertebral disc of cervical spine 04/01/2015   Lumbar disc disease 04/01/2015   Hormone replacement therapy (postmenopausal) 07/21/2014   Fatty (change of) liver, not elsewhere classified 05/29/2012    [name], PT 07/15/2021, 10:08 AM  [facility] [place], [place], [address] Phone: [phone]   Fax:  [phone]  Name: [name] MRN: [id number] Date of Birth: Feb 08, 1956

## 2021-07-22 ENCOUNTER — Ambulatory Visit: Payer: PPO | Admitting: Physical Therapy

## 2021-07-28 ENCOUNTER — Other Ambulatory Visit: Payer: Self-pay

## 2021-07-28 ENCOUNTER — Inpatient Hospital Stay: Payer: PPO | Attending: Hematology & Oncology

## 2021-07-28 ENCOUNTER — Inpatient Hospital Stay: Payer: PPO | Admitting: Hematology & Oncology

## 2021-07-28 ENCOUNTER — Encounter: Payer: Self-pay | Admitting: Hematology & Oncology

## 2021-07-28 VITALS — BP 153/90 | HR 90 | Temp 97.8°F | Resp 18 | Ht 63.0 in | Wt 214.1 lb

## 2021-07-28 DIAGNOSIS — Z7982 Long term (current) use of aspirin: Secondary | ICD-10-CM | POA: Diagnosis not present

## 2021-07-28 DIAGNOSIS — I10 Essential (primary) hypertension: Secondary | ICD-10-CM

## 2021-07-28 DIAGNOSIS — I2699 Other pulmonary embolism without acute cor pulmonale: Secondary | ICD-10-CM | POA: Insufficient documentation

## 2021-07-28 DIAGNOSIS — Z86711 Personal history of pulmonary embolism: Secondary | ICD-10-CM

## 2021-07-28 LAB — CMP (CANCER CENTER ONLY)
ALT: 21 U/L (ref 0–44)
AST: 31 U/L (ref 15–41)
Albumin: 3.8 g/dL (ref 3.5–5.0)
Alkaline Phosphatase: 63 U/L (ref 38–126)
Anion gap: 6 (ref 5–15)
BUN: 20 mg/dL (ref 8–23)
CO2: 29 mmol/L (ref 22–32)
Calcium: 9.1 mg/dL (ref 8.9–10.3)
Chloride: 104 mmol/L (ref 98–111)
Creatinine: 1.01 mg/dL — ABNORMAL HIGH (ref 0.44–1.00)
GFR, Estimated: >60 mL/min (ref >60)
Glucose, Bld: 113 mg/dL — ABNORMAL HIGH (ref 70–99)
Potassium: 5.5 mmol/L — ABNORMAL HIGH (ref 3.5–5.1)
Sodium: 139 mmol/L (ref 135–145)
Total Bilirubin: 0.7 mg/dL (ref 0.3–1.2)
Total Protein: 6.7 g/dL (ref 6.5–8.1)

## 2021-07-28 LAB — CBC WITH DIFFERENTIAL (CANCER CENTER ONLY)
Abs Immature Granulocytes: 0.01 10*3/uL (ref 0.00–0.07)
Basophils Absolute: 0 10*3/uL (ref 0.0–0.1)
Basophils Relative: 1 %
Eosinophils Absolute: 0.1 10*3/uL (ref 0.0–0.5)
Eosinophils Relative: 1 %
HCT: 45.6 % (ref 36.0–46.0)
Hemoglobin: 15 g/dL (ref 12.0–15.0)
Immature Granulocytes: 0 %
Lymphocytes Relative: 40 %
Lymphs Abs: 2.7 10*3/uL (ref 0.7–4.0)
MCH: 32.8 pg (ref 26.0–34.0)
MCHC: 32.9 g/dL (ref 30.0–36.0)
MCV: 99.6 fL (ref 80.0–100.0)
Monocytes Absolute: 0.6 10*3/uL (ref 0.1–1.0)
Monocytes Relative: 8 %
Neutro Abs: 3.4 10*3/uL (ref 1.7–7.7)
Neutrophils Relative %: 50 %
Platelet Count: 231 10*3/uL (ref 150–400)
RBC: 4.58 MIL/uL (ref 3.87–5.11)
RDW: 13.5 % (ref 11.5–15.5)
WBC Count: 6.8 10*3/uL (ref 4.0–10.5)
nRBC: 0 % (ref 0.0–0.2)

## 2021-07-28 NOTE — Progress Notes (Signed)
?Hematology and Oncology Follow Up Visit ? ?[name] ?[id number] ?August 29, 1955 66 y.o. ?07/28/2021 ? ? ?Principle Diagnosis:  ?Pulmonary embolism -- dx 'ed in 01/2020 ?  ?Past Work-up: ?Hypercoag panel negative ?  ?Current Therapy: ?Eliquis 2.5 mg PO BID -- complete 1 yr of therapy in 07/2021 ?EC ASA 162 mg po q day --  start on 07/29/2021 ?  ?Interim History:  [name] is here today with her husband for follow-up.  She looks quite good.  I am so proud of her.  She has been losing weight.  She looks good.  She is losing some weight.  She is trying her best to lose a bit more weight. ? ?She has had no problems with respect to the Eliquis.  She completes her 1 year of treatment of maintenance now.  We will then put her on baby aspirin. ? ?She has had no issues with nausea or vomiting.  She has had no problems with bowels or bladder.  There is no bleeding.  She has had no cough or shortness of breath.  She apparently does have a paralyzed diaphragm over on the right side. ? ?She does have a recurrent cyst on the top of her left foot.  She has had foot surgery before. ? ?She has not noted any fever.  She has had no problems with COVID. ? ?Overall, I would say performance status is ECOG 1.  ? ?Medications:  ?Allergies as of 07/28/2021   ?No Known Allergies ?  ? ?  ?Medication List  ?  ? ?  ? Accurate as of July 28, 2021 11:17 AM. If you have any questions, ask your nurse or doctor.  ?  ?  ? ?  ? ?STOP taking these medications   ? ?estradiol 1 MG tablet ?Commonly known as: ESTRACE ?Stopped by: Volanda Napoleon, MD ?  ? ?  ? ?TAKE these medications   ? ?albuterol 108 (90 Base) MCG/ACT inhaler ?Commonly known as: VENTOLIN HFA ?Inhale 2 puffs into the lungs every 6 (six) hours as needed for wheezing or shortness of breath. ?  ?buPROPion 300 MG 24 hr tablet ?Commonly known as: WELLBUTRIN XL ?Take 1 tablet (300 mg total) by mouth daily. ?  ?CASCARA SAGRADA PO ?Take 1 capsule by mouth at bedtime. ?  ?celecoxib 100 MG  capsule ?Commonly known as: CELEBREX ?TAKE ONE TO TWO CAPSULES BY MOUTH TWICE A DAY ?  ?DULoxetine 60 MG capsule ?Commonly known as: CYMBALTA ?Take 1 capsule (60 mg total) by mouth daily. ?  ?Eliquis 5 MG Tabs tablet ?Generic drug: apixaban ?Take 2.5 mg by mouth 2 (two) times daily. ?  ?omeprazole 40 MG capsule ?Commonly known as: PRILOSEC ?TAKE 1 CAPSULE BY MOUTH IN THE MORNING AND AT BEDTIME. ?  ?pregabalin 75 MG capsule ?Commonly known as: LYRICA ?Take 1 capsule (75 mg total) by mouth 3 (three) times daily. ?  ?Vitamin D 125 MCG (5000 UT) Caps ?Take 5,000 Units by mouth daily. ?  ?vitamin E 1000 UNIT capsule ?Take 1,000 Units by mouth daily. ?  ? ?  ? ? ?Allergies: No Known Allergies ? ?Past Medical History, Surgical history, Social history, and Family History were reviewed and updated. ? ?Review of Systems: ?Review of Systems  ?Constitutional: Negative.   ?HENT: Negative.    ?Eyes: Negative.   ?Respiratory: Negative.    ?Cardiovascular: Negative.   ?Gastrointestinal: Negative.   ?Genitourinary: Negative.   ?Musculoskeletal: Negative.   ?Skin: Negative.   ?Neurological: Negative.   ?Endo/Heme/Allergies:  Negative.   ?Psychiatric/Behavioral: Negative.    ? ? ?Physical Exam: ? height is '5\' 3"'$  (1.6 m) and weight is 214 lb 1.3 oz (97.1 kg). Her oral temperature is 97.8 ?F (36.6 ?C). Her blood pressure is 153/90 (abnormal) and her pulse is 90. Her respiration is 18 and oxygen saturation is 99%.  ? ?Wt Readings from Last 3 Encounters:  ?07/28/21 214 lb 1.3 oz (97.1 kg)  ?06/14/21 215 lb 11.2 oz (97.8 kg)  ?04/21/21 212 lb (96.2 kg)  ? ? ?Physical Exam ?Vitals reviewed.  ?HENT:  ?   Head: Normocephalic and atraumatic.  ?Eyes:  ?   Pupils: Pupils are equal, round, and reactive to light.  ?Cardiovascular:  ?   Rate and Rhythm: Normal rate and regular rhythm.  ?   Heart sounds: Normal heart sounds.  ?Pulmonary:  ?   Effort: Pulmonary effort is normal.  ?   Breath sounds: Normal breath sounds.  ?Abdominal:  ?   General:  Bowel sounds are normal.  ?   Palpations: Abdomen is soft.  ?Musculoskeletal:     ?   General: No tenderness or deformity. Normal range of motion.  ?   Cervical back: Normal range of motion.  ?Lymphadenopathy:  ?   Cervical: No cervical adenopathy.  ?Skin: ?   General: Skin is warm and dry.  ?   Findings: No erythema or rash.  ?Neurological:  ?   Mental Status: She is alert and oriented to person, place, and time.  ?Psychiatric:     ?   Behavior: Behavior normal.     ?   Thought Content: Thought content normal.     ?   Judgment: Judgment normal.  ? ? ?Lab Results  ?Component Value Date  ? WBC 6.8 07/28/2021  ? HGB 15.0 07/28/2021  ? HCT 45.6 07/28/2021  ? MCV 99.6 07/28/2021  ? PLT 231 07/28/2021  ? ?Lab Results  ?Component Value Date  ? FERRITIN 439 (H) 04/20/2020  ? IRON 88 04/20/2020  ? TIBC 223 (L) 04/20/2020  ? UIBC 135 04/20/2020  ? IRONPCTSAT 39 04/20/2020  ? ?Lab Results  ?Component Value Date  ? RETICCTPCT 2.2 04/20/2020  ? RBC 4.58 07/28/2021  ? ?No results found for: KPAFRELGTCHN, LAMBDASER, KAPLAMBRATIO ?No results found for: IGGSERUM, IGA, IGMSERUM ?No results found for: TOTALPROTELP, ALBUMINELP, A1GS, A2GS, BETS, BETA2SER, GAMS, MSPIKE, SPEI ?  Chemistry   ?   ?Component Value Date/Time  ? NA 139 07/28/2021 0929  ? K 5.5 (H) 07/28/2021 2423  ? CL 104 07/28/2021 0929  ? CO2 29 07/28/2021 0929  ? BUN 20 07/28/2021 0929  ? CREATININE 1.01 (H) 07/28/2021 0929  ? CREATININE 0.78 02/09/2020 1415  ?    ?Component Value Date/Time  ? CALCIUM 9.1 07/28/2021 0929  ? ALKPHOS 63 07/28/2021 0929  ? AST 31 07/28/2021 0929  ? ALT 21 07/28/2021 0929  ? BILITOT 0.7 07/28/2021 0929  ?  ? ? ? ?Impression and Plan: [name] is a very pleasant 66 yo caucasian female with recent diagnosis of small right upper lobe pulmonary embolus.  This appears to be idiopathic. ? ?We will now get her off Eliquis.  She was on full dose Eliquis for 1 year.  She then was placed on maintenance Eliquis for 1 year.  I will now have her on baby  aspirin.  I told her to take the baby aspirin in the morning with breakfast. ? ?I would like to see her back in 6 months.  If there are any problems before we see her back, we can certainly get her in sooner. ? ?I told her to make sure that she stays hydrated. ? ?I noted that her potassium was up a little bit.  I would like to repeat her metabolic panel in a week. ? ?Volanda Napoleon, MD ?3/9/202311:17 AM ? ?

## 2021-07-29 ENCOUNTER — Ambulatory Visit: Payer: PPO | Attending: Podiatry | Admitting: Physical Therapy

## 2021-07-29 DIAGNOSIS — R262 Difficulty in walking, not elsewhere classified: Secondary | ICD-10-CM | POA: Insufficient documentation

## 2021-07-29 DIAGNOSIS — R29898 Other symptoms and signs involving the musculoskeletal system: Secondary | ICD-10-CM | POA: Insufficient documentation

## 2021-07-29 DIAGNOSIS — M79672 Pain in left foot: Secondary | ICD-10-CM | POA: Diagnosis not present

## 2021-07-29 NOTE — Therapy (Signed)
?[facility] ?[address] ?[place], [place], [address] ?Phone: [phone]   Fax:  [phone] ? ?Physical Therapy Treatment ? ?Patient Details  ?Name: [name] ?MRN: [id number] ?Date of Birth: 02-04-1956 ?Referring Provider (PT): Celesta Gentile ? ? ?Encounter Date: 07/29/2021 ? ? PT End of Session - 07/29/21 0933   ? ? Visit Number 3   ? Number of Visits 6   ? Date for PT Re-Evaluation 08/17/21   ? Authorization Type [employer]   ? PT Start Time [phone]   ? PT Stop Time 1015   ? PT Time Calculation (min) 42 min   ? Activity Tolerance Patient tolerated treatment well   ? Behavior During Therapy [facility] for tasks assessed/performed   ? ?  ?  ? ?  ? ? ?Past Medical History:  ?Diagnosis Date  ? Anxiety   ? Asthma 02/21/2018  ? Back pain   ? Cancer [facility])   ? Depression   ? GERD (gastroesophageal reflux disease)   ? Hypertension   ? ? ?Past Surgical History:  ?Procedure Laterality Date  ? ABDOMINAL HYSTERECTOMY    ? ARTHROSCOPY WITH ANTERIOR CRUCIATE LIGAMENT (ACL) REPAIR WITH ANTERIOR TIBILIAS GRAFT    ? BACK SURGERY    ? EXCISION MASS ABDOMINAL Left 11/12/2020  ? Procedure: EXCISION LEFT MASS ABDOMINAL;  Surgeon: Kinsinger, Arta Bruce, MD;  Location: [facility];  Service: General;  Laterality: Left;  ? SHOULDER ARTHROSCOPY W/ ROTATOR CUFF REPAIR    ? SHOULDER OPEN ROTATOR CUFF REPAIR    ? TUBAL LIGATION    ? ? ?There were no vitals filed for this visit. ? ? Subjective Assessment - 07/29/21 0934   ? ? Subjective Pt states she has been trying to do her exercises -- not sure if they're doing anything. Pt feels that her foot continues to swell.   ? Pertinent History Lt ankle surgery 12/22/2020   ? Limitations Standing;Walking   ? How long can you stand comfortably? 30 minutes   ? How long can you walk comfortably? 5 minutes   ? Diagnostic tests MRI shows: 1. Severe tenosynovitis of the extensor digitorum tendon sheath.  2. Longitudinal split tear of the peroneus  brevis with mild  tenosynovitis.  3. Severe osteoarthritis of the navicular-cuneiform joints with  severe subchondral marrow edema and cystic changes.  4. Mild plantar fasciitis of the medial band of the plantar fascia.   ? Patient Stated Goals decrease pain   ? Currently in Pain? Yes   ? Pain Score 5    ? Pain Location Foot   ? Pain Orientation Left   ? ?  ?  ? ?  ? ? ? ? ? [facility] PT Assessment - 07/29/21 0001   ? ?  ? Assessment  ? Medical Diagnosis Tendonitis of Lt ankle   ? Referring Provider (PT) Celesta Gentile   ? ?  ?  ? ?  ? ? ? ? ? ? ? ? ? ? ? ? ? ? ? ? [place] Adult PT Treatment/Exercise - 07/29/21 0001   ? ?  ? Vasopneumatic  ? Number Minutes Vasopneumatic  10 minutes   ? Vasopnuematic Location  Ankle   ? Vasopneumatic Pressure Low   ? Vasopneumatic Temperature  34   ?  ? Manual Therapy  ? Joint Mobilization calcaneal medial/lateral glides grade II to III. Subtalar joint glides for inv/ev grade II to III   ? Soft tissue mobilization STM along peroneals   ?  ?  Ankle Exercises: Seated  ? Towel Crunch --   3x10  ? Other Seated Ankle Exercises short foot 3x10   ? Other Seated Ankle Exercises Big toe ext and abd 2x10 each; other toe flexion/ext 2x10   ?  ? Ankle Exercises: Supine  ? T-Band green tband eversion/inv x20   ?  ? Ankle Exercises: Stretches  ? Other Stretch toe extension stretch x30 sec   ? ?  ?  ? ?  ? ? ? ? ? ? ? ? ? ? ? ? ? ? ? PT Long Term Goals - 07/06/21 1011   ? ?  ? PT LONG TERM GOAL #1  ? Title Pt will be independent with HEP   ? Time 6   ? Period Weeks   ? Status New   ? Target Date 08/17/21   ?  ? PT LONG TERM GOAL #2  ? Title Pt will improve FOTO to 64 to demo improved functional mobility   ? Time 6   ? Period Weeks   ? Status New   ? Target Date 08/17/21   ?  ? PT LONG TERM GOAL #3  ? Title Pt will tolerate walking x 10 minutes with pain <= 2/10   ? Time 6   ? Period Weeks   ? Status New   ? Target Date 08/17/21   ? ?  ?  ? ?  ? ? ? ? ? ? ? ? Plan - 07/29/21 1000   ? ? Clinical Impression  Statement Tretament session focused primarily on intrinsic foot strengthening. Difficulty with toe spreading and especially big toe abduction. Worked on arch lifting to provide improved arch support. Subtalar joint appears most stiff going into eversion. Provided manual work to address this.   ? Personal Factors and Comorbidities Age;Past/Current Experience;Time since onset of injury/illness/exacerbation   ? Examination-Activity Limitations Locomotion Level;Stand;Stairs   ? Examination-Participation Restrictions Community Activity   ? Rehab Potential Good   ? PT Frequency 1x / week   ? PT Duration 6 weeks   ? PT Treatment/Interventions Cryotherapy;Aquatic Therapy;Iontophoresis '4mg'$ /ml Dexamethasone;Electrical Stimulation;Moist Heat;Ultrasound;Stair training;Gait training;Balance training;Therapeutic exercise;Neuromuscular re-education;Therapeutic activities;Patient/family education;Manual techniques;Passive range of motion;Dry needling;Taping;Vasopneumatic Device   ? PT Next Visit Plan foot and ankle strength and mobility, manual/modalities as indicated   ? PT [place]   ? Consulted and Agree with Plan of Care Patient   ? ?  ?  ? ?  ? ? ?Patient will benefit from skilled therapeutic intervention in order to improve the following deficits and impairments:  Pain, Decreased endurance, Decreased balance, Difficulty walking, Decreased strength, Decreased activity tolerance, Decreased range of motion, Increased edema ? ?Visit Diagnosis: ?Pain in left foot ? ?Difficulty in walking, not elsewhere classified ? ?Other symptoms and signs involving the musculoskeletal system ? ? ? ? ?Problem List ?Patient Active Problem List  ? Diagnosis Date Noted  ? At risk for obstructive sleep apnea 03/26/2020  ? History of pulmonary embolus (PE) 03/26/2020  ? Basal cell carcinoma (BCC) of right ala nasi 02/21/2019  ? Basal cell carcinoma (BCC) of right upper eyelid 02/07/2019  ? Dysphagia 10/30/2018  ? History of  esophageal dilatation 10/30/2018  ? Chronic right shoulder pain 10/15/2018  ? Asthma 02/21/2018  ? Paralyzed hemidiaphragm 02/21/2018  ? Elevated hemidiaphragm 02/05/2018  ? Atelectasis 02/05/2018  ? Anxiety 02/01/2018  ? Cervical neck pain with evidence of disc disease 02/01/2018  ? BMI 38.0-38.9,adult 02/01/2018  ? Cataract 04/01/2015  ? Depression 04/01/2015  ?  Hyperlipidemia 04/01/2015  ? Hypertension 04/01/2015  ? Disorder of intervertebral disc of cervical spine 04/01/2015  ? Lumbar disc disease 04/01/2015  ? Hormone replacement therapy (postmenopausal) 07/21/2014  ? Fatty (change of) liver, not elsewhere classified 05/29/2012  ? ? ?Andrey Hoobler April Gordy Levan, PT, DPT ?07/29/2021, 10:12 AM ? ?[place] ?[facility] ?[address] ?[place], [place], [address] ?Phone: [phone]   Fax:  [phone] ? ?Name: [name] ?MRN: [id number] ?Date of Birth: 07-09-1955 ? ? ? ?

## 2021-08-03 ENCOUNTER — Ambulatory Visit: Payer: PPO | Admitting: Physical Therapy

## 2021-08-03 ENCOUNTER — Other Ambulatory Visit: Payer: Self-pay

## 2021-08-03 DIAGNOSIS — R29898 Other symptoms and signs involving the musculoskeletal system: Secondary | ICD-10-CM

## 2021-08-03 DIAGNOSIS — R262 Difficulty in walking, not elsewhere classified: Secondary | ICD-10-CM

## 2021-08-03 DIAGNOSIS — M79672 Pain in left foot: Secondary | ICD-10-CM | POA: Diagnosis not present

## 2021-08-03 NOTE — Therapy (Addendum)
[place] ?[facility] ?[address] ?[place], [place], [address] ?Phone: [phone]   Fax:  [phone] ? ?Physical Therapy Treatment and Discharge ? ?Patient Details  ?Name: [name] ?MRN: [id number] ?Date of Birth: [birth year] ?Referring Provider (PT): Celesta Gentile ? ? ?Encounter Date: 08/03/2021 ? ? PT End of Session - 08/03/21 1006   ? ? Visit Number 4   ? Number of Visits 6   ? Date for PT Re-Evaluation 08/17/21   ? PT Start Time [phone]   ? PT Stop Time 1005   ? PT Time Calculation (min) 40 min   ? Activity Tolerance Patient tolerated treatment well   ? Behavior During Therapy [facility] for tasks assessed/performed   ? ?  ?  ? ?  ? ? ?Past Medical History:  ?Diagnosis Date  ? Anxiety   ? Asthma 02/21/2018  ? Back pain   ? Cancer [facility])   ? Depression   ? GERD (gastroesophageal reflux disease)   ? Hypertension   ? ? ?Past Surgical History:  ?Procedure Laterality Date  ? ABDOMINAL HYSTERECTOMY    ? ARTHROSCOPY WITH ANTERIOR CRUCIATE LIGAMENT (ACL) REPAIR WITH ANTERIOR TIBILIAS GRAFT    ? BACK SURGERY    ? EXCISION MASS ABDOMINAL Left 11/12/2020  ? Procedure: EXCISION LEFT MASS ABDOMINAL;  Surgeon: Kinsinger, Arta Bruce, MD;  Location: [facility];  Service: General;  Laterality: Left;  ? SHOULDER ARTHROSCOPY W/ ROTATOR CUFF REPAIR    ? SHOULDER OPEN ROTATOR CUFF REPAIR    ? TUBAL LIGATION    ? ? ?There were no vitals filed for this visit. ? ? Subjective Assessment - 08/03/21 0930   ? ? Subjective Pt states that she felt better after last visit. Thinks the toe yoga helps   ? Patient Stated Goals decrease pain   ? Currently in Pain? Yes   ? Pain Score 3    ? Pain Location Foot   ? Pain Orientation Left   ? Pain Descriptors / Indicators Aching   ? ?  ?  ? ?  ? ? ? ? ? [facility] PT Assessment - 08/03/21 0001   ? ?  ? Assessment  ? Medical Diagnosis Tendonitis of Lt ankle   ? Referring Provider (PT) Celesta Gentile   ?  ? Strength  ? Left Ankle Inversion 4-/5   ? Left Ankle  Eversion 4-/5   ? ?  ?  ? ?  ? ? ? ? ? ? ? ? ? ? ? ? ? ? ? ? [place] Adult PT Treatment/Exercise - 08/03/21 0001   ? ?  ? Manual Therapy  ? Joint Mobilization calcaneal medial/lateral glides grade II to III. Subtalar joint glides for inv/ev grade II to III, metatarsal mobs 2-3   ?  ? Ankle Exercises: Aerobic  ? Nustep 5 mins level 5 for warm up   ?  ? Ankle Exercises: Seated  ? Ankle Circles/Pumps 20 reps   ? Towel Crunch --   2 min  ? Other Seated Ankle Exercises short foot 3x10   ? Other Seated Ankle Exercises Big toe ext and abd 2x10 each; other toe flexion/ext 2x10   ?  ? Ankle Exercises: Supine  ? T-Band green tband eversion/inv x20   ?  ? Ankle Exercises: Stretches  ? Other Stretch toe extension stretch 2 x30 sec   ? ?  ?  ? ?  ? ? ? ? ? ? ? ? ? ? ? ? ? ? ?  PT Long Term Goals - 07/06/21 1011   ? ?  ? PT LONG TERM GOAL #1  ? Title Pt will be independent with HEP   ? Time 6   ? Period Weeks   ? Status New   ? Target Date 08/17/21   ?  ? PT LONG TERM GOAL #2  ? Title Pt will improve FOTO to 64 to demo improved functional mobility   ? Time 6   ? Period Weeks   ? Status New   ? Target Date 08/17/21   ?  ? PT LONG TERM GOAL #3  ? Title Pt will tolerate walking x 10 minutes with pain <= 2/10   ? Time 6   ? Period Weeks   ? Status New   ? Target Date 08/17/21   ? ?  ?  ? ?  ? ? ? ? ? ? ? ? Plan - 08/03/21 1008   ? ? Clinical Impression Statement Pt with much decreased pain and improved mobility. She has decreased strength with inversion and eversion which improves after manual work and with cues.   ? PT Next Visit Plan foot and ankle strength and mobility, manual/modalities as indicated. progress HEP   ? PT [place]   ? Consulted and Agree with Plan of Care Patient   ? ?  ?  ? ?  ? ? ?Patient will benefit from skilled therapeutic intervention in order to improve the following deficits and impairments:    ? ?Visit Diagnosis: ?Pain in left foot ? ?Difficulty in walking, not elsewhere classified ? ?Other  symptoms and signs involving the musculoskeletal system ? ? ? ? ?Problem List ?Patient Active Problem List  ? Diagnosis Date Noted  ? At risk for obstructive sleep apnea 03/26/2020  ? History of pulmonary embolus (PE) 03/26/2020  ? Basal cell carcinoma (BCC) of right ala nasi 02/21/2019  ? Basal cell carcinoma (BCC) of right upper eyelid 02/07/2019  ? Dysphagia 10/30/2018  ? History of esophageal dilatation 10/30/2018  ? Chronic right shoulder pain 10/15/2018  ? Asthma 02/21/2018  ? Paralyzed hemidiaphragm 02/21/2018  ? Elevated hemidiaphragm 02/05/2018  ? Atelectasis 02/05/2018  ? Anxiety 02/01/2018  ? Cervical neck pain with evidence of disc disease 02/01/2018  ? BMI 38.0-38.9,adult 02/01/2018  ? Cataract 04/01/2015  ? Depression 04/01/2015  ? Hyperlipidemia 04/01/2015  ? Hypertension 04/01/2015  ? Disorder of intervertebral disc of cervical spine 04/01/2015  ? Lumbar disc disease 04/01/2015  ? Hormone replacement therapy (postmenopausal) 07/21/2014  ? Fatty (change of) liver, not elsewhere classified 05/29/2012  ? ?PHYSICAL THERAPY DISCHARGE SUMMARY ? ?Visits from Start of Care: 4 ? ?Current functional level related to goals / functional outcomes: ?Decreased pain, improved mobility ?  ?Remaining deficits: ?swelling ?  ?Education / Equipment: ?HEP  ? ?Patient agrees to discharge. Patient goals were partially met. Patient is being discharged due to not returning since the last visit. ? ?Isabelle Course, PT,DPT05/17/237:54 AM ? ? ?Katryn Plummer, PT ?08/03/2021, 10:09 AM ? ?[place] ?[facility] ?[address] ?[place], [place], [address] ?Phone: [phone]   Fax:  [phone] ? ?Name: [name] ?MRN: [id number] ?Date of Birth: 30-Jan-1956 ? ? ? ?

## 2021-08-04 ENCOUNTER — Inpatient Hospital Stay: Payer: PPO

## 2021-08-10 ENCOUNTER — Ambulatory Visit: Payer: PPO | Admitting: Physical Therapy

## 2021-08-11 ENCOUNTER — Other Ambulatory Visit: Payer: PPO

## 2021-08-18 ENCOUNTER — Ambulatory Visit: Payer: PPO | Admitting: Podiatry

## 2021-08-18 ENCOUNTER — Ambulatory Visit (INDEPENDENT_AMBULATORY_CARE_PROVIDER_SITE_OTHER): Payer: PPO

## 2021-08-18 DIAGNOSIS — R609 Edema, unspecified: Secondary | ICD-10-CM | POA: Diagnosis not present

## 2021-08-18 DIAGNOSIS — M7751 Other enthesopathy of right foot: Secondary | ICD-10-CM | POA: Diagnosis not present

## 2021-08-18 DIAGNOSIS — M7752 Other enthesopathy of left foot: Secondary | ICD-10-CM

## 2021-08-18 DIAGNOSIS — M778 Other enthesopathies, not elsewhere classified: Secondary | ICD-10-CM | POA: Diagnosis not present

## 2021-08-18 DIAGNOSIS — M19072 Primary osteoarthritis, left ankle and foot: Secondary | ICD-10-CM | POA: Diagnosis not present

## 2021-08-18 NOTE — Progress Notes (Signed)
SITUATION: ?Reason for Visit: Fitting and Delivery of Custom Fabricated Foot Orthoses ?Patient Report: Patient reports comfort and is satisfied with device. ? ?OBJECTIVE DATA: ?Patient History / Diagnosis:   ?  ICD-10-CM   ?1. Capsulitis of foot, left  M77.8   ?  ?2. Arthritis of left foot  M19.072   ?  ? ? ?Provided Device:  Custom Functional Foot Orthotics ?    [employer]: [id number] ? ?GOAL OF ORTHOSIS ?- Improve gait ?- Decrease energy expenditure ?- Improve Balance ?- Provide Triplanar stability of foot complex ?- Facilitate motion ? ?ACTIONS PERFORMED ?Patient was fit with foot orthotics trimmed to shoe last. Patient tolerated fittign procedure.  ? ?Patient was provided with verbal and written instruction and demonstration regarding donning, doffing, wear, care, proper fit, function, purpose, cleaning, and use of the orthosis and in all related precautions and risks and benefits regarding the orthosis. ? ?Patient was also provided with verbal instruction regarding how to report any failures or malfunctions of the orthosis and necessary follow up care. Patient was also instructed to contact our office regarding any change in status that may affect the function of the orthosis. ? ?Patient demonstrated independence with proper donning, doffing, and fit and verbalized understanding of all instructions. ? ?PLAN: ?Patient is to follow up in one week or as necessary (PRN). All questions were answered and concerns addressed. Plan of care was discussed with and agreed upon by the patient. ? ?

## 2021-08-22 NOTE — Progress Notes (Signed)
Subjective: ?66 year old female presents to the office today for concerns of discomfort to her foot, ankle.  She said that she still gets discomfort and swelling.  Swelling seems to get worse after being on her for some time.  The injection has not been helpful.  She also presents to pick up orthotics.  She has no other concerns today.  ? ? ?Objective: ?AAO x3, NAD ?DP/PT pulses palpable bilaterally, CRT less than 3 seconds ?Tenderness palpation on the dorsal medial aspect of the midfoot as well as the dorsal central aspect.  There is localized swelling noted.  There is no significant tenderness on palpation today.  Flexor, extensor tendons appear to be intact MMT 5/5.   ?No pain with calf compression, swelling, warmth, erythema ? ?Assessment: ?Right foot capsulitis, arthritis; tendonitis ankle ? ?Plan: ?-All treatment options discussed with the patient including all alternatives, risks, complications.  ?-Injections have not been helpful so we will hold off on any further injections.  Recommend compression socks help with swelling.  Also she presents did pick up orthotics with [facility].  Upon trying these on she states they were very comfortable.  Continue with supportive shoe gear and orthotics for now.  She is not interested in further surgery. ? ?Trula Slade DPM ?

## 2021-08-30 ENCOUNTER — Telehealth: Payer: PPO

## 2021-09-15 ENCOUNTER — Other Ambulatory Visit: Payer: Self-pay | Admitting: Medical-Surgical

## 2021-09-16 NOTE — Telephone Encounter (Signed)
Per pharmacy - Alternative Requested:[facility] ONLY PAYS 2 PILLS ONCE A DAY. ?

## 2021-09-19 NOTE — Telephone Encounter (Signed)
I contacted the patient and she is taking the Celebrex once in the morning and once at night and she said that seems to be helping. ?

## 2021-09-20 ENCOUNTER — Other Ambulatory Visit: Payer: Self-pay

## 2021-09-20 ENCOUNTER — Encounter: Payer: Self-pay | Admitting: Medical-Surgical

## 2021-09-20 MED ORDER — DULOXETINE HCL 60 MG PO CPEP: 60.0000 mg | ORAL_CAPSULE | Freq: Every day | ORAL | 1 refills | Status: DC

## 2021-09-20 NOTE — Telephone Encounter (Signed)
I spoke to the patient and she is only taking this Rx once daily and I sent in a new Rx to [facility]. ?

## 2021-09-20 NOTE — Progress Notes (Signed)
Spoke with the patient and she is on ly taking this medication once daily. ? ?I have sent in a new Rx to [facility]. ?

## 2021-09-30 DIAGNOSIS — M2041 Other hammer toe(s) (acquired), right foot: Secondary | ICD-10-CM | POA: Diagnosis not present

## 2021-09-30 DIAGNOSIS — M25571 Pain in right ankle and joints of right foot: Secondary | ICD-10-CM | POA: Diagnosis not present

## 2021-09-30 DIAGNOSIS — M79671 Pain in right foot: Secondary | ICD-10-CM | POA: Diagnosis not present

## 2021-10-19 ENCOUNTER — Ambulatory Visit (INDEPENDENT_AMBULATORY_CARE_PROVIDER_SITE_OTHER): Payer: PPO | Admitting: Medical-Surgical

## 2021-10-19 VITALS — Ht 64.0 in | Wt 205.0 lb

## 2021-10-19 DIAGNOSIS — Z1211 Encounter for screening for malignant neoplasm of colon: Secondary | ICD-10-CM | POA: Diagnosis not present

## 2021-10-19 DIAGNOSIS — Z780 Asymptomatic menopausal state: Secondary | ICD-10-CM

## 2021-10-19 DIAGNOSIS — Z1231 Encounter for screening mammogram for malignant neoplasm of breast: Secondary | ICD-10-CM

## 2021-10-19 DIAGNOSIS — Z0000 Encounter for general adult medical examination without abnormal findings: Secondary | ICD-10-CM | POA: Diagnosis not present

## 2021-10-19 NOTE — Progress Notes (Signed)
MEDICARE ANNUAL WELLNESS VISIT  10/19/2021  Telephone Visit Disclaimer This Medicare AWV was conducted by telephone due to national recommendations for restrictions regarding the COVID-19 Pandemic (e.g. social distancing).  I verified, using two identifiers, that I am speaking with [name] or their authorized healthcare agent. I discussed the limitations, risks, security, and privacy concerns of performing an evaluation and management service by telephone and the potential availability of an in-person appointment in the future. The patient expressed understanding and agreed to proceed.  Location of Patient: Home Location of Provider (nurse):  Provider Home  Subjective:    [name] is a 66 y.o. female patient of [name], [name] who had a Medicare Annual Wellness Visit today via telephone. [name] is Retired and lives with their spouse. she has 2 children. she reports that she is socially active and does interact with friends/family regularly. she is minimally physically active and enjoys spending time with her grandchldren.  Patient Care Team: [name], [name] as PCP - General (Nurse Practitioner) [name], [name] as Pharmacist (Pharmacist)     10/19/2021    8:06 AM 07/28/2021   10:31 AM 07/06/2021    9:56 AM 04/21/2021    2:43 PM 11/05/2020    1:07 PM 08/20/2020    4:06 PM 05/20/2020    2:50 PM  Advanced Directives  Does Patient Have a Medical Advance Directive? No No No No No No No  Would patient like information on creating a medical advance directive? No - Patient declined No - Patient declined  No - Patient declined  No - Patient declined No - Patient declined    Hospital Utilization Over the Past 12 Months: # of hospitalizations or ER visits: 0 # of surgeries: 1  Review of Systems    Patient reports that her overall health is better compared to last year.  History obtained from chart review and the patient  Patient Reported Readings (BP, Pulse, CBG, Weight,  etc) Weight 204 lb Height 30f  Pain Assessment Pain : No/denies pain     Current Medications & Allergies (verified) Allergies as of 10/19/2021   No Known Allergies      Medication List        Accurate as of Oct 19, 2021  8:27 AM. If you have any questions, ask your nurse or doctor.          albuterol 108 (90 Base) MCG/ACT inhaler Commonly known as: VENTOLIN HFA Inhale 2 puffs into the lungs every 6 (six) hours as needed for wheezing or shortness of breath.   buPROPion 300 MG 24 hr tablet Commonly known as: WELLBUTRIN XL Take 1 tablet (300 mg total) by mouth daily.   CASCARA SAGRADA PO Take 1 capsule by mouth at bedtime.   celecoxib 100 MG capsule Commonly known as: CELEBREX Take 1 capsule (100 mg total) by mouth 2 (two) times daily as needed. TAKE ONE TO TWO CAPSULES BY MOUTH TWICE A DAY   DULoxetine 60 MG capsule Commonly known as: CYMBALTA Take 1 capsule (60 mg total) by mouth daily.   Eliquis 5 MG Tabs tablet Generic drug: apixaban Take 2.5 mg by mouth 2 (two) times daily.   meloxicam 15 MG tablet Commonly known as: MOBIC Take 15 mg by mouth 3 (three) times daily.   omeprazole 40 MG capsule Commonly known as: PRILOSEC TAKE 1 CAPSULE BY MOUTH IN THE MORNING AND AT BEDTIME.   pregabalin 75 MG capsule Commonly known as: LYRICA Take 1 capsule (  75 mg total) by mouth 3 (three) times daily.   Vitamin D 125 MCG (5000 UT) Caps Take 5,000 Units by mouth daily.   vitamin E 1000 UNIT capsule Take 1,000 Units by mouth daily.        History (reviewed): Past Medical History:  Diagnosis Date   Anxiety    Asthma 02/21/2018   Back pain    Cancer (HCC)    Depression    GERD (gastroesophageal reflux disease)    Hypertension    Past Surgical History:  Procedure Laterality Date   ABDOMINAL HYSTERECTOMY     ARTHROSCOPY WITH ANTERIOR CRUCIATE LIGAMENT (ACL) REPAIR WITH ANTERIOR TIBILIAS GRAFT     BACK SURGERY     EXCISION MASS ABDOMINAL Left 11/12/2020    Procedure: EXCISION LEFT MASS ABDOMINAL;  Surgeon: Kinsinger, Arta Bruce, MD;  Location: [facility];  Service: General;  Laterality: Left;   FOOT SURGERY Left    SHOULDER ARTHROSCOPY W/ ROTATOR CUFF REPAIR     SHOULDER OPEN ROTATOR CUFF REPAIR     TUBAL LIGATION     Family History  Problem Relation Age of Onset   Cancer Mother        lung   Cancer Father        lung   Hypertension Father    Diabetes Father    Breast cancer Sister 33   Breast cancer Maternal Aunt    Colon cancer Maternal Aunt    Esophageal cancer Maternal Uncle    Breast cancer Cousin    Non-Hodgkin's lymphoma Son    Rectal cancer Neg Hx    Stomach cancer Neg Hx    Social History   Socioeconomic History   Marital status: Married    Spouse name: [name]   Number of children: 2   Years of education: 12   Highest education level: 12th grade  Occupational History   Occupation: retired  Tobacco Use   Smoking status: Never    Passive exposure: Yes   Smokeless tobacco: Never  Vaping Use   Vaping Use: Never used  Substance and Sexual Activity   Alcohol use: Yes    Comment: occasionally   Drug use: Never   Sexual activity: Yes    Birth control/protection: Post-menopausal, Surgical    Comment: hysterectomy  Other Topics Concern   Not on file  Social History Narrative   Lives with husband. She has two children. She enjoys spending time with grandchildren.   Social Determinants of Health   Financial Resource Strain: Low Risk    Difficulty of Paying Living Expenses: Not hard at all  Food Insecurity: No Food Insecurity   Worried About Charity fundraiser in the Last Year: Never true   [place] in the Last Year: Never true  Transportation Needs: No Transportation Needs   Lack of Transportation (Medical): No   Lack of Transportation (Non-Medical): No  Physical Activity: Inactive   Days of Exercise per Week: 0 days   Minutes of Exercise per Session: 0 min  Stress: No Stress Concern Present   Feeling  of Stress : Not at all  Social Connections: Moderately Isolated   Frequency of Communication with Friends and Family: More than three times a week   Frequency of Social Gatherings with Friends and Family: More than three times a week   Attends Religious Services: Never   Marine scientist or Organizations: No   Attends Archivist Meetings: Never   Marital Status: Married  Activities of Daily Living    10/19/2021    8:10 AM 11/05/2020    1:09 PM  In your present state of health, do you have any difficulty performing the following activities:  Hearing? 0   Vision? 0   Difficulty concentrating or making decisions? 1   Comment forgetful at times.   Walking or climbing stairs? 1   Comment left foot pain   Dressing or bathing? 0   Doing errands, shopping? 0 0  Preparing Food and eating ? N   Using the Toilet? N   In the past six months, have you accidently leaked urine? N   Do you have problems with loss of bowel control? N   Managing your Medications? N   Managing your Finances? N   Housekeeping or managing your Housekeeping? N     Patient Education/ Literacy How often do you need to have someone help you when you read instructions, pamphlets, or other written materials from your doctor or pharmacy?: 1 - Never What is the last grade level you completed in school?: 12th grade  Exercise Current Exercise Habits: The patient does not participate in regular exercise at present, Exercise limited by: orthopedic condition(s) (foot pain.)  Diet Patient reports consuming 2 meals a day and 0 snack(s) a day Patient reports that her primary diet is: Regular Patient reports that she does have regular access to food.   Depression Screen    10/19/2021    8:06 AM 06/14/2021    9:39 AM 02/05/2018    9:03 AM 02/01/2018   11:40 AM  PHQ 2/9 Scores  PHQ - 2 Score 0 0 2 1  PHQ- 9 Score 0  8 8  Exception Documentation    Medical reason     Fall Risk    10/19/2021    8:06 AM  06/14/2021    9:39 AM  [place] in the past year? 0 0  Number falls in past yr: 0 0  Injury with Fall? 0 0  Risk for fall due to : No Fall Risks   Follow up Falls evaluation completed Falls evaluation completed     Objective:  FENIX RUPPE seemed alert and oriented and she participated appropriately during our telephone visit.  Blood Pressure Weight BMI  BP Readings from Last 3 Encounters:  07/28/21 (!) 153/90  06/14/21 (!) 141/85  04/21/21 (!) 141/74   Wt Readings from Last 3 Encounters:  10/19/21 205 lb (93 kg)  07/28/21 214 lb 1.3 oz (97.1 kg)  06/14/21 215 lb 11.2 oz (97.8 kg)   BMI Readings from Last 1 Encounters:  10/19/21 35.19 kg/m    *Unable to obtain current vital signs, weight, and BMI due to telephone visit type  Hearing/Vision  Teresia did not seem to have difficulty with hearing/understanding during the telephone conversation Reports that she has not had a formal eye exam by an eye care professional within the past year Reports that she has not had a formal hearing evaluation within the past year *Unable to fully assess hearing and vision during telephone visit type  Cognitive Function:    10/19/2021    8:13 AM  6CIT Screen  What Year? 0 points  What month? 0 points  What time? 0 points  Count back from 20 0 points  Months in reverse 0 points  Repeat phrase 0 points  Total Score 0 points   (Normal:0-7, Significant for Dysfunction: >8)  Normal Cognitive Function Screening: Yes   Immunization &  Health Maintenance Record Immunization History  Administered Date(s) Administered   Moderna Sars-Covid-2 Vaccination 08/21/2019, 09/16/2019, 05/12/2020   PNEUMOCOCCAL CONJUGATE-20 06/14/2021   Tdap 11/12/2019   Zoster Recombinat (Shingrix) 06/14/2021    Health Maintenance  Topic Date Due   COVID-19 Vaccine (4 - Booster for Moderna series) 11/04/2021 (Originally 07/07/2020)   Zoster Vaccines- Shingrix (2 of 2) 01/19/2022 (Originally 08/09/2021)    DEXA SCAN  10/20/2022 (Originally 04/04/2020)   COLONOSCOPY (Pts 45-37yr Insurance coverage will need to be confirmed)  10/20/2022 (Originally 01/13/2016)   INFLUENZA VACCINE  12/20/2021   MAMMOGRAM  02/02/2023   TETANUS/TDAP  11/11/2029   Pneumonia Vaccine 66 Years old  Completed   Hepatitis C Screening  Completed   HPV VACCINES  Aged Out       Assessment  This is a routine wellness examination for [employer]  Health Maintenance: Due or Overdue There are no preventive care reminders to display for this patient.   ARema FendtSapp does not need a referral for Community Assistance: Care Management:   no Social Work:    no Prescription Assistance:  no Nutrition/Diabetes Education:  no   Plan:  Personalized Goals  Goals Addressed               This Visit's Progress     Patient Stated (pt-stated)        She would like to be able to walk better.       Personalized Health Maintenance & Screening Recommendations  Bone densitometry screening Colorectal cancer screening Shingrix vaccine-2nd  dose Mammogram due in September  Lung Cancer Screening Recommended: no (Low Dose CT Chest recommended if Age 850-80years, 30 pack-year currently smoking OR have quit w/in past 15 years) Hepatitis C Screening recommended: no HIV Screening recommended: no  Advanced Directives: Written information was not prepared per patient's request.  Referrals & Orders Orders Placed This Encounter  Procedures   DTangerine  Ambulatory referral to Gastroenterology (for Colonoscopy)    Follow-up Plan Follow-up with JSamuel Edwards [name] as planned Schedule your 2nd dose of shingrix vaccine. Mammogram referral and bone density referral has been sent and they will call you to schedule. Mammogram will be due after 02/01/22. Colonoscopy referral has been sent and someone will call you to make that appointment. Medicare wellness visit in one year.  Patient will  access AVS on my chart.   I have personally reviewed and noted the following in the patient's chart:   Medical and social history Use of alcohol, tobacco or illicit drugs  Current medications and supplements Functional ability and status Nutritional status Physical activity Advanced directives List of other physicians Hospitalizations, surgeries, and ER visits in previous 12 months Vitals Screenings to include cognitive, depression, and falls Referrals and appointments  In addition, I have reviewed and discussed with AMadelin Headingscertain preventive protocols, quality metrics, and best practice recommendations. A written personalized care plan for preventive services as well as general preventive health recommendations is available and can be mailed to the patient at her request.      BTinnie Gens RN-BSN  10/19/2021

## 2021-10-19 NOTE — Patient Instructions (Addendum)
[place] Maintenance Summary and Written Plan of Care  Ms. Appleby ,  Thank you for allowing me to perform your Medicare Annual Wellness Visit and for your ongoing commitment to your health.   Health Maintenance & Immunization History Health Maintenance  Topic Date Due   COVID-19 Vaccine (4 - Booster for Moderna series) 11/04/2021 (Originally 07/07/2020)   Zoster Vaccines- Shingrix (2 of 2) 01/19/2022 (Originally 08/09/2021)   DEXA SCAN  10/20/2022 (Originally 04/04/2020)   COLONOSCOPY (Pts 45-64yr Insurance coverage will need to be confirmed)  10/20/2022 (Originally 01/13/2016)   INFLUENZA VACCINE  12/20/2021   MAMMOGRAM  02/02/2023   TETANUS/TDAP  11/11/2029   Pneumonia Vaccine 66 Years old  Completed   Hepatitis C Screening  Completed   HPV VACCINES  Aged Out   Immunization History  Administered Date(s) Administered   MMarriottVaccination 08/21/2019, 09/16/2019, 05/12/2020   PNEUMOCOCCAL CONJUGATE-20 06/14/2021   Tdap 11/12/2019   Zoster Recombinat (Shingrix) 06/14/2021    These are the patient goals that we discussed:  Goals Addressed               This Visit's Progress     Patient Stated (pt-stated)        She would like to be able to walk better.         This is a list of Health Maintenance Items that are overdue or due now: Bone densitometry screening Colorectal cancer screening Shingrix vaccine-2nd dose Mammogram due in September  Orders/Referrals Placed Today: Orders Placed This Encounter  Procedures   DEXAScan    Standing Status:   Future    Standing Expiration Date:   10/20/2022    Scheduling Instructions:     Please call patient to schedule.    Order Specific Question:   Reason for exam:    Answer:   Post Menopausal    Order Specific Question:   Preferred imaging [facility]?    [facility]amm[facility] 3D SCREEN BREAST BILATERAL    Standing Status:   Future    Standing Expiration Date:    10/20/2022    Scheduling Instructions:     Please call patient to schedule. She will be due after 02/01/22.    Order Specific Question:   Reason for Exam (SYMPTOM  OR DIAGNOSIS REQUIRED)    Answer:   Breast cancer screening    Order Specific Question:   Preferred imaging location?    [facility]  Ambulatory referral to Gastroenterology (for Colonoscopy)    Referral Priority:   Routine    Referral Type:   Consultation    Referral Reason:   Specialty Services Required    Number of Visits Requested:   1    (Contact our referral department at [phone] you have not spoken with someone about your referral appointment within the next 5 days)    Follow-up Plan Follow-up with JSamuel Bouche NP as planned Schedule your 2nd dose of shingrix vaccine. Mammogram referral and bone density referral has been sent and they will call you to schedule. Mammogram will be due after 02/01/22. Colonoscopy referral has been sent and someone will call you to make that appointment. Medicare wellness visit in one year.  Patient will access AVS on my chart.      Health Maintenance, Female Adopting a healthy lifestyle and getting preventive care are important in promoting health and wellness. Ask your health care provider about: The right schedule for you  to have regular tests and exams. Things you can do on your own to prevent diseases and keep yourself healthy. What should I know about diet, weight, and exercise? Eat a healthy diet  Eat a diet that includes plenty of vegetables, fruits, low-fat dairy products, and lean protein. Do not eat a lot of foods that are high in solid fats, added sugars, or sodium. Maintain a healthy weight Body mass index (BMI) is used to identify weight problems. It estimates body fat based on height and weight. Your health care provider can help determine your BMI and help you achieve or maintain a healthy weight. Get regular exercise Get regular  exercise. This is one of the most important things you can do for your health. Most adults should: Exercise for at least 150 minutes each week. The exercise should increase your heart rate and make you sweat (moderate-intensity exercise). Do strengthening exercises at least twice a week. This is in addition to the moderate-intensity exercise. Spend less time sitting. Even light physical activity can be beneficial. Watch cholesterol and blood lipids Have your blood tested for lipids and cholesterol at 67 years of age, then have this test every 5 years. Have your cholesterol levels checked more often if: Your lipid or cholesterol levels are high. You are older than 67 years of age. You are at high risk for heart disease. What should I know about cancer screening? Depending on your health history and family history, you may need to have cancer screening at various ages. This may include screening for: Breast cancer. Cervical cancer. Colorectal cancer. Skin cancer. Lung cancer. What should I know about heart disease, diabetes, and high blood pressure? Blood pressure and heart disease High blood pressure causes heart disease and increases the risk of stroke. This is more likely to develop in people who have high blood pressure readings or are overweight. Have your blood pressure checked: Every 3-5 years if you are 17-17 years of age. Every year if you are 62 years old or older. Diabetes Have regular diabetes screenings. This checks your fasting blood sugar level. Have the screening done: Once every three years after age 27 if you are at a normal weight and have a low risk for diabetes. More often and at a younger age if you are overweight or have a high risk for diabetes. What should I know about preventing infection? Hepatitis B If you have a higher risk for hepatitis B, you should be screened for this virus. Talk with your health care provider to find out if you are at risk for hepatitis B  infection. Hepatitis C Testing is recommended for: Everyone born from [birth year] through 1966. Anyone with known risk factors for hepatitis C. Sexually transmitted infections (STIs) Get screened for STIs, including gonorrhea and chlamydia, if: You are sexually active and are younger than 67 years of age. You are older than 67 years of age and your health care provider tells you that you are at risk for this type of infection. Your sexual activity has changed since you were last screened, and you are at increased risk for chlamydia or gonorrhea. Ask your health care provider if you are at risk. Ask your health care provider about whether you are at high risk for HIV. Your health care provider may recommend a prescription medicine to help prevent HIV infection. If you choose to take medicine to prevent HIV, you should first get tested for HIV. You should then be tested every 3 months for as long  as you are taking the medicine. Pregnancy If you are about to stop having your period (premenopausal) and you may become pregnant, seek counseling before you get pregnant. Take 400 to 800 micrograms (mcg) of folic acid every day if you become pregnant. Ask for birth control (contraception) if you want to prevent pregnancy. Osteoporosis and menopause Osteoporosis is a disease in which the bones lose minerals and strength with aging. This can result in bone fractures. If you are 72 years old or older, or if you are at risk for osteoporosis and fractures, ask your health care provider if you should: Be screened for bone loss. Take a calcium or vitamin D supplement to lower your risk of fractures. Be given hormone replacement therapy (HRT) to treat symptoms of menopause. Follow these instructions at home: Alcohol use Do not drink alcohol if: Your health care provider tells you not to drink. You are pregnant, may be pregnant, or are planning to become pregnant. If you drink alcohol: Limit how much you have  to: 0-1 drink a day. Know how much alcohol is in your drink. In the [place] one drink equals one 12 oz bottle of beer (355 mL), one 5 oz glass of wine (148 mL), or one 1 oz glass of hard liquor (44 mL). Lifestyle Do not use any products that contain nicotine or tobacco. These products include cigarettes, chewing tobacco, and vaping devices, such as e-cigarettes. If you need help quitting, ask your health care provider. Do not use street drugs. Do not share needles. Ask your health care provider for help if you need support or information about quitting drugs. General instructions Schedule regular health, dental, and eye exams. Stay current with your vaccines. Tell your health care provider if: You often feel depressed. You have ever been abused or do not feel safe at home. Summary Adopting a healthy lifestyle and getting preventive care are important in promoting health and wellness. Follow your health care provider's instructions about healthy diet, exercising, and getting tested or screened for diseases. Follow your health care provider's instructions on monitoring your cholesterol and blood pressure. This information is not intended to replace advice given to you by your health care provider. Make sure you discuss any questions you have with your health care provider. Document Revised: 09/27/2020 Document Reviewed: 09/27/2020 Elsevier Patient Education  [employer].

## 2021-10-21 ENCOUNTER — Ambulatory Visit (INDEPENDENT_AMBULATORY_CARE_PROVIDER_SITE_OTHER): Payer: PPO | Admitting: Pharmacist

## 2021-10-21 DIAGNOSIS — F419 Anxiety disorder, unspecified: Secondary | ICD-10-CM

## 2021-10-21 DIAGNOSIS — I10 Essential (primary) hypertension: Secondary | ICD-10-CM

## 2021-10-21 DIAGNOSIS — Z86711 Personal history of pulmonary embolism: Secondary | ICD-10-CM

## 2021-10-21 DIAGNOSIS — G8929 Other chronic pain: Secondary | ICD-10-CM

## 2021-10-21 DIAGNOSIS — M19071 Primary osteoarthritis, right ankle and foot: Secondary | ICD-10-CM | POA: Diagnosis not present

## 2021-10-21 NOTE — Progress Notes (Signed)
Chronic Care Management Pharmacy Note  10/24/2021 Name:  [name] MRN:  [id number] DOB:  1955-06-18  Summary: addressed HTN, depression/anxiety, chronic pain, and hx of PE on chronic anticoagulation - was recently cleared to discontinue eliquis. Reviewed safety of NSAIDs and not to take both celecoxib and meloxicam simultaneously.  BP readings at home: 366-294T systolic.    Recommendations/Changes made from today's visit: none, pt is doing well  Plan: f/u with pharmacist in 6-12 months  Subjective: [name] is an 66 y.o. year old female who is a primary patient of Samuel Bouche, NP.  The CCM team was consulted for assistance with disease management and care coordination needs.    Engaged with patient by telephone for follow up visit in response to provider referral for pharmacy case management and/or care coordination services.   Consent to Services:  The patient was given information about Chronic Care Management services, agreed to services, and gave verbal consent prior to initiation of services.  Please see initial visit note for detailed documentation.   Patient Care Team: Samuel Bouche, NP as PCP - General (Nurse Practitioner) Darius Bump, [facility] as Pharmacist (Pharmacist)  Recent office visits:  N/A   Recent consult visits:  02/14/21 Celesta Gentile DPM Podiatry- pt was seen for Status post left foot bone spur excision, PRP injection. Xray and MRI were ordered. F/u after MRI.   01/20/21 Celesta Gentile DPM-  pt was seen for Status post left foot bone spur excision, PRP injection. An Xray was done and pt was started on Mupirocin 2% BID and Cephalexin 500 mg qid. Follow up in 1 week.   12/20/20 Celesta Gentile DPM- pt was seen for a skin abrasion was started on Zofran HCI 4 mg q8h PRN and oxycodone- acetaminophen 5-325 mg 1-2 tablets q6h. No follow up noted.   12/20/20 Burney Gauze MD Oncology- pt was seen for hx of pulmonary embolism. Labs were ordered for the cancer  center and provider discontinued pt's Ibuprofen 800 mg. Follow up 4 months   10/12/20 Celesta Gentile DPM- pt was seen for Capsulitis of left foot. Xray of left foot was ordered and patient opted in for surgery.   Hospital visits:  1 Medication Reconciliation was completed by comparing discharge summary, patient's EMR and Pharmacy list, and upon discussion with patient.   Admitted to the hospital on 11/12/20 due to general surgery. Discharge date was 11/12/20. Discharged from [place]?Medications Started at [facility] Discharge:?? -started ibuprofen    Medication Changes at Hospital Discharge: -Changed  celecoxib  Eliquis  loratadine  omeprazole    Medications Discontinued at Hospital Discharge: -Stopped  dicyclomine 20 MG tablet  fexofenadine 180 MG tablet   Medications that remain the same after Hospital Discharge:??  -All other medications will remain the same.     2 Admitted to the hospital on 10/17/20 due to Cough. Discharge date was 10/17/20. Discharged from [facility]?Medications Started at [facility] Discharge:?? -started benzonatate  cefdinir  fexofenadine  predniSONE   Medication Changes at Hospital Discharge: -Changed none   Medications Discontinued at Hospital Discharge: -Stopped none   Medications that remain the same after Hospital Discharge:??  -All other medications will remain the same.   Objective:  Lab Results  Component Value Date   CREATININE 1.01 (H) 07/28/2021   CREATININE 0.92 04/21/2021   CREATININE 0.86 12/20/2020    No results found for: HGBA1C Last diabetic  Eye exam: No results found for: HMDIABEYEEXA  Last diabetic Foot exam: No results found for: HMDIABFOOTEX      Component Value Date/Time   CHOL 201 (H) 06/14/2021 0000   TRIG 59 06/14/2021 0000   HDL 89 06/14/2021 0000   CHOLHDL 2.3 06/14/2021 0000   LDLCALC 98 06/14/2021 0000       Latest Ref Rng & Units 07/28/2021     9:29 AM 04/21/2021    2:27 PM 12/20/2020    2:07 PM  Hepatic Function  Total Protein 6.5 - 8.1 g/dL 6.7   6.7   6.7    Albumin 3.5 - 5.0 g/dL 3.8   3.9   3.8    AST 15 - 41 U/L 31   24   36    ALT 0 - 44 U/L _0 Alk Phosphatase 38 - 126 U/L 63   58   61    Total Bilirubin 0.3 - 1.2 mg/dL 0.7   0.7   0.7      Lab Results  Component Value Date/Time   TSH 2.21 02/01/2018 12:05 PM       Latest Ref Rng & Units 07/28/2021    9:29 AM 06/14/2021   12:00 AM 04/21/2021    2:27 PM  CBC  WBC 4.0 - 10.5 K/uL 6.8   5.3   7.4    Hemoglobin 12.0 - 15.0 g/dL 15.0   14.6   14.8    Hematocrit 36.0 - 46.0 % 45.6   43.6   45.0    Platelets 150 - 400 K/uL 231   233   227       Social History   Tobacco Use  Smoking Status Never   Passive exposure: Yes  Smokeless Tobacco Never   BP Readings from Last 3 Encounters:  07/28/21 (!) 153/90  06/14/21 (!) 141/85  04/21/21 (!) 141/74   Pulse Readings from Last 3 Encounters:  07/28/21 90  06/14/21 80  04/21/21 94   Wt Readings from Last 3 Encounters:  10/19/21 205 lb (93 kg)  07/28/21 214 lb 1.3 oz (97.1 kg)  06/14/21 215 lb 11.2 oz (97.8 kg)    Assessment: Review of patient past medical history, allergies, medications, health status, including review of consultants reports, laboratory and other test data, was performed as part of comprehensive evaluation and provision of chronic care management services.   SDOH:  (Social Determinants of Health) assessments and interventions performed:    CCM Care Plan  No Known Allergies  Medications Reviewed Today     Reviewed by Darius Bump, [facility] (Pharmacist) on 10/21/21 at 1447  Med List Status: <None>   Medication Order Taking? Sig Documenting Provider Last Dose Status Informant  albuterol (VENTOLIN HFA) 108 (90 Base) MCG/ACT inhaler [id number] Yes Inhale 2 puffs into the lungs every 6 (six) hours as needed for wheezing or shortness of breath. Martyn Ehrich, NP Taking Active  Self  buPROPion (WELLBUTRIN XL) 300 MG 24 hr tablet [id number] Yes Take 1 tablet (300 mg total) by mouth daily. Samuel Bouche, NP Taking Active   CASCARA SAGRADA PO [id number] Yes Take 1 capsule by mouth at bedtime. [provider] Taking Active Self  celecoxib (CELEBREX) 100 MG capsule [id number] No Take 1 capsule (100 mg total) by mouth 2 (two) times daily as needed. TAKE ONE TO TWO CAPSULES BY MOUTH TWICE A DAY  Patient not taking: Reported on 10/21/2021   Samuel Bouche, NP  Not Taking Active            Med Note Darius Bump   Fri Oct 21, 2021  2:46 PM) Stopping temporarily while taking meloxicam for foot, per podiatry  Cholecalciferol (VITAMIN D) 125 MCG (5000 UT) CAPS [id number] Yes Take 5,000 Units by mouth daily. [provider] Taking Active Self  DULoxetine (CYMBALTA) 60 MG capsule [id number] Yes Take 1 capsule (60 mg total) by mouth daily. Samuel Bouche, NP Taking Active   meloxicam (MOBIC) 15 MG tablet [id number] Yes Take 15 mg by mouth daily. [provider] Taking Active   omeprazole (PRILOSEC) 40 MG capsule [id number] Yes TAKE 1 CAPSULE BY MOUTH IN THE MORNING AND AT BEDTIME. Samuel Bouche, NP Taking Active   pregabalin (LYRICA) 75 MG capsule [id number] Yes Take 1 capsule (75 mg total) by mouth 3 (three) times daily. Samuel Bouche, NP Taking Active   vitamin E 1000 UNIT capsule [id number] Yes Take 1,000 Units by mouth daily. [provider] Taking Active Self            Patient Active Problem List   Diagnosis Date Noted   At risk for obstructive sleep apnea 03/26/2020   History of pulmonary embolus (PE) 03/26/2020   Basal cell carcinoma (BCC) of right ala nasi 02/21/2019   Basal cell carcinoma (BCC) of right upper eyelid 02/07/2019   Dysphagia 10/30/2018   History of esophageal dilatation 10/30/2018   Chronic right shoulder pain 10/15/2018   Asthma 02/21/2018   Paralyzed hemidiaphragm 02/21/2018   Elevated hemidiaphragm 02/05/2018   Atelectasis  02/05/2018   Anxiety 02/01/2018   Cervical neck pain with evidence of disc disease 02/01/2018   BMI 38.0-38.9,adult 02/01/2018   Cataract 04/01/2015   Depression 04/01/2015   Hyperlipidemia 04/01/2015   Hypertension 04/01/2015   Disorder of intervertebral disc of cervical spine 04/01/2015   Lumbar disc disease 04/01/2015   Hormone replacement therapy (postmenopausal) 07/21/2014   Fatty (change of) liver, not elsewhere classified 05/29/2012    Immunization History  Administered Date(s) Administered   Moderna Sars-Covid-2 Vaccination 08/21/2019, 09/16/2019, 05/12/2020   PNEUMOCOCCAL CONJUGATE-20 06/14/2021   Tdap 11/12/2019   Zoster Recombinat (Shingrix) 06/14/2021    Conditions to be addressed/monitored: HTN, Depression, and chronic pain, hx of PE on anticoagulant  Care Plan : Medication Management  Updates made by Darius Bump, [place] since 10/24/2021 12:00 AM     Problem: HTN, mental health, chronic pain, hx of PE on anticoag      Long-Range Goal: Disease Progression Prevention   Start Date: 02/28/2021  Recent Progress: On track  Priority: High  Note:   Current Barriers:  None at present  Pharmacist Clinical Goal(s):  Over the next 180 days, patient will maintain control of chronic conditions as evidenced by medication fill history, lab values, and vital signs  through collaboration with PharmD and provider.   Interventions: 1:1 collaboration with Emeterio Reeve, DO regarding development and update of comprehensive plan of care as evidenced by provider attestation and co-signature Inter-disciplinary care team collaboration (see longitudinal plan of care) Comprehensive medication review performed; medication list updated in electronic medical record  Hypertension:  Controlled; current treatment:lifestyle modifications only, lost weight and came off of BP meds;   Current home readings: SBP 120-130s   Denies hypotensive/hypertensive symptoms  Recommended continue  current regimen,   Hx of PE on long term anticoagulation:  Controlled; was cleared to discontinue eliquis;   Recommended continue current regimen  Depression/Anxiety:  Controlled; current treatment: wellbutrin XL 356m daily,  cymbalta 67m daily;   Recommended continue current regimen  Chronic Pain:  Controlled; current treatment: celebrex 103mBID, duloxtein 6056maily, lyrica 85m9mD;   Recommended continue current regimen  Patient Goals/Self-Care Activities Over the next 180 days, patient will:  take medications as prescribed  Follow Up Plan: Telephone follow up appointment with care management team member scheduled for:  6 months       Medication Assistance: None required.  Patient affirms current coverage meets needs.  [facility]  [id number] [place]: [id number] [place] [place]: [phone] pill box? Yes Pt endorses 100% compliance  Follow Up:  Patient agrees to Care Plan and Follow-up.  Plan: Telephone follow up appointment with care management team member scheduled for:  6-12 months  KeeshLarinda ButteryrmD Clinical Pharmacist [phone]

## 2021-10-24 NOTE — Patient Instructions (Signed)
Visit Information  Thank you for taking time to visit with me today. Please don't hesitate to contact me if I can be of assistance to you before our next scheduled telephone appointment.  Following are the goals we discussed today:   Patient Goals/Self-Care Activities Over the next 180 days, patient will:  take medications as prescribed  Follow Up Plan: Telephone follow up appointment with care management team member scheduled for:  6-12 months   Please call the care guide team at [phone] if you need to cancel or reschedule your appointment.    Patient verbalizes understanding of instructions and care plan provided today and agrees to view in MyChart. Active MyChart status and patient understanding of how to access instructions and care plan via MyChart confirmed with patient.     [name]  

## 2021-11-09 ENCOUNTER — Other Ambulatory Visit: Payer: Self-pay | Admitting: Medical-Surgical

## 2021-11-09 ENCOUNTER — Encounter: Payer: Self-pay | Admitting: Medical-Surgical

## 2021-11-10 MED ORDER — PREGABALIN 75 MG PO CAPS
75.0000 mg | ORAL_CAPSULE | Freq: Three times a day (TID) | ORAL | 2 refills | Status: DC
Start: 2021-11-10 — End: 2022-01-30

## 2021-11-10 NOTE — Telephone Encounter (Signed)
Completed.

## 2021-11-18 DIAGNOSIS — I10 Essential (primary) hypertension: Secondary | ICD-10-CM

## 2021-11-25 DIAGNOSIS — M19071 Primary osteoarthritis, right ankle and foot: Secondary | ICD-10-CM | POA: Diagnosis not present

## 2021-12-13 ENCOUNTER — Other Ambulatory Visit: Payer: Self-pay | Admitting: Medical-Surgical

## 2021-12-20 ENCOUNTER — Ambulatory Visit: Payer: PPO | Admitting: Medical-Surgical

## 2021-12-23 ENCOUNTER — Other Ambulatory Visit: Payer: Self-pay | Admitting: Medical-Surgical

## 2021-12-26 ENCOUNTER — Other Ambulatory Visit: Payer: Self-pay | Admitting: Medical-Surgical

## 2021-12-29 ENCOUNTER — Telehealth: Payer: Self-pay | Admitting: Medical-Surgical

## 2021-12-29 NOTE — Telephone Encounter (Signed)
A virtual visit for her 82-monthfollow-up is inappropriate for this patient as she needs an in person visit to monitor blood pressure as well as update her Shingrix vaccines.  Please contact patient to change this to an office visit. ___________________________________________ JClearnce Sorrel DNP, APRN, FNP-BC Primary Care and [place]

## 2021-12-30 ENCOUNTER — Encounter: Payer: Self-pay | Admitting: Medical-Surgical

## 2021-12-30 ENCOUNTER — Ambulatory Visit (INDEPENDENT_AMBULATORY_CARE_PROVIDER_SITE_OTHER): Payer: PPO | Admitting: Medical-Surgical

## 2021-12-30 VITALS — BP 134/83 | HR 95 | Resp 20 | Ht 64.0 in | Wt 210.7 lb

## 2021-12-30 DIAGNOSIS — F419 Anxiety disorder, unspecified: Secondary | ICD-10-CM | POA: Diagnosis not present

## 2021-12-30 DIAGNOSIS — Z23 Encounter for immunization: Secondary | ICD-10-CM

## 2021-12-30 DIAGNOSIS — Z780 Asymptomatic menopausal state: Secondary | ICD-10-CM

## 2021-12-30 DIAGNOSIS — E782 Mixed hyperlipidemia: Secondary | ICD-10-CM

## 2021-12-30 DIAGNOSIS — I10 Essential (primary) hypertension: Secondary | ICD-10-CM | POA: Diagnosis not present

## 2021-12-30 DIAGNOSIS — K219 Gastro-esophageal reflux disease without esophagitis: Secondary | ICD-10-CM

## 2021-12-30 MED ORDER — OMEPRAZOLE 40 MG PO CPDR: DELAYED_RELEASE_CAPSULE | ORAL | 1 refills | Status: DC

## 2021-12-30 MED ORDER — BUPROPION HCL ER (XL) 300 MG PO TB24: 300.0000 mg | ORAL_TABLET | Freq: Every day | ORAL | 1 refills | Status: DC

## 2021-12-30 MED ORDER — DULOXETINE HCL 60 MG PO CPEP: 60.0000 mg | ORAL_CAPSULE | Freq: Two times a day (BID) | ORAL | 1 refills | Status: DC

## 2021-12-30 MED ORDER — LISINOPRIL 5 MG PO TABS: 5.0000 mg | ORAL_TABLET | Freq: Every day | ORAL | 3 refills | Status: DC

## 2021-12-30 NOTE — Progress Notes (Signed)
Established Patient Office Visit  Subjective   Patient ID: [name], female   DOB: [birth year] Age: 66 y.o. MRN: [id number]   Chief Complaint  Patient presents with   Follow-up   HPI Pleasant 66 year old female presenting today for follow up on:  HTN: not currently taking medication but notes that her readings have been high over the last several doctor's appts. Checked BP at home with an elevated reading. Used to take BP meds but hasn't in several years. Denies CP, SOB, palpitations, lower extremity edema, dizziness, headaches, or vision changes.  Mood: taking Cymbalta '60mg'$  daily, tolerating well without side effects. Since stopping estrogen, has noted more irritability and hot flashes.  Notes that her husband has encouraged her to go back on estrogen in order to take care of her mood concerns.  Denies SI/HI.   Objective:    Vitals:   12/30/21 1311 12/30/21 1414  BP: (!) 141/89 134/83  Pulse: 99 95  Resp: 20   Height: '5\' 4"'$  (1.626 m)   Weight: 210 lb 11.2 oz (95.6 kg)   SpO2: 96%   BMI (Calculated): 36.15    Physical Exam Vitals and nursing note reviewed.  Constitutional:      General: She is not in acute distress.    Appearance: Normal appearance. She is obese. She is not ill-appearing.  HENT:     Head: Normocephalic and atraumatic.  Cardiovascular:     Rate and Rhythm: Normal rate and regular rhythm.     Pulses: Normal pulses.     Heart sounds: Normal heart sounds.  Pulmonary:     Effort: Pulmonary effort is normal. No respiratory distress.     Breath sounds: Normal breath sounds. No wheezing, rhonchi or rales.  Skin:    General: Skin is warm and dry.  Neurological:     Mental Status: She is alert and oriented to person, place, and time.  Psychiatric:        Mood and Affect: Mood normal.        Behavior: Behavior normal.        Thought Content: Thought content normal.        Judgment: Judgment normal.   No results found for this or any previous visit  (from the past 24 hour(s)).     The 10-year ASCVD risk score (Arnett DK, et al., 2019) is: 7.6%   Values used to calculate the score:     Age: 14 years     Sex: Female     Is Non-Hispanic African American: No     Diabetic: No     Tobacco smoker: No     Systolic Blood Pressure: 419 mmHg     Is BP treated: Yes     HDL Cholesterol: 89 mg/dL     Total Cholesterol: 201 mg/dL   Assessment & Plan:   1. Hypertension, unspecified type Blood pressure is elevated today at 141/89.  Recheck of 134/83.  Recommend low-sodium diet, regular intentional exercise, and weight loss to healthy weight for lifestyle management.  Treated with lisinopril in the past so starting lisinopril 5 mg daily.  Recommend monitoring blood pressure at home with goal of 130/80 or less.  2. Mixed hyperlipidemia Continue low-fat diet, regular intentional exercise.  Aim for weight loss to healthy weight.  Limit intake of dietary saturated fats and trans fats specifically.  3. Need for shingles vaccine Shingrix No. 2 given in office today. - Zoster Recombinant (Shingrix )  4. Anxiety Continue Wellbutrin 300 mg  daily.  Increasing Cymbalta to 60 mg twice daily. - DULoxetine (CYMBALTA) 60 MG capsule; Take 1 capsule (60 mg total) by mouth 2 (two) times daily.  Dispense: 180 capsule; Refill: 1 - buPROPion (WELLBUTRIN XL) 300 MG 24 hr tablet; Take 1 tablet (300 mg total) by mouth daily.  Dispense: 90 tablet; Refill: 1  5. Post-menopausal See above. - DULoxetine (CYMBALTA) 60 MG capsule; Take 1 capsule (60 mg total) by mouth 2 (two) times daily.  Dispense: 180 capsule; Refill: 1 - buPROPion (WELLBUTRIN XL) 300 MG 24 hr tablet; Take 1 tablet (300 mg total) by mouth daily.  Dispense: 90 tablet; Refill: 1  6. Gastroesophageal reflux disease, unspecified whether esophagitis present Refilling omeprazole 40 mg twice daily.  Discussed reducing this to once daily dosing.  If this is not helpful, we may need to do 20 mg twice daily to  lower the risk of osteoporosis and GI complications. Patient verbalized understanding is agreeable to the plan. - omeprazole (PRILOSEC) 40 MG capsule; TAKE 1 CAPSULE BY MOUTH EVERY DAY IN THE MORNING AND AT BEDTIME  Dispense: 180 capsule; Refill: 1  Return in about 2 weeks (around 01/13/2022) for nurse visit for BP check.  ___________________________________________ Clearnce Sorrel, DNP, APRN, FNP-BC Primary Care and [place]

## 2022-01-11 ENCOUNTER — Other Ambulatory Visit: Payer: PPO

## 2022-01-13 ENCOUNTER — Ambulatory Visit (INDEPENDENT_AMBULATORY_CARE_PROVIDER_SITE_OTHER): Payer: PPO | Admitting: Family Medicine

## 2022-01-13 VITALS — BP 129/78 | HR 84 | Resp 20

## 2022-01-13 DIAGNOSIS — I10 Essential (primary) hypertension: Secondary | ICD-10-CM | POA: Diagnosis not present

## 2022-01-13 NOTE — Progress Notes (Signed)
   Subjective:    Patient ID: [name], female    DOB: 1955-11-15, 66 y.o.   MRN: [id number]  HPI  Patient is here for blood pressure check. Denies trouble sleeping, palpitations or medication problems.   Review of Systems     Objective:   Physical Exam        Assessment & Plan:   Patient advised to schedule a follow up with Samuel Bouche in 3 months.

## 2022-01-13 NOTE — Progress Notes (Signed)
Medical screening examination/treatment was performed by qualified clinical staff member and as supervising physician I was immediately available for consultation/collaboration. I have reviewed documentation and agree with assessment and plan.  Allyn Bartelson, DO  

## 2022-01-25 ENCOUNTER — Encounter: Payer: Self-pay | Admitting: Medical-Surgical

## 2022-01-25 DIAGNOSIS — F419 Anxiety disorder, unspecified: Secondary | ICD-10-CM

## 2022-01-25 DIAGNOSIS — Z780 Asymptomatic menopausal state: Secondary | ICD-10-CM

## 2022-01-26 ENCOUNTER — Inpatient Hospital Stay: Payer: PPO | Admitting: Hematology & Oncology

## 2022-01-26 ENCOUNTER — Inpatient Hospital Stay: Payer: PPO

## 2022-01-27 MED ORDER — DULOXETINE HCL 30 MG PO CPEP: 30.0000 mg | ORAL_CAPSULE | Freq: Every evening | ORAL | 3 refills | Status: DC

## 2022-01-27 MED ORDER — DULOXETINE HCL 60 MG PO CPEP: 60.0000 mg | ORAL_CAPSULE | Freq: Every day | ORAL | 1 refills | Status: DC

## 2022-01-29 ENCOUNTER — Encounter: Payer: Self-pay | Admitting: Medical-Surgical

## 2022-01-29 ENCOUNTER — Other Ambulatory Visit: Payer: Self-pay | Admitting: Medical-Surgical

## 2022-01-29 DIAGNOSIS — Z780 Asymptomatic menopausal state: Secondary | ICD-10-CM

## 2022-01-29 DIAGNOSIS — F419 Anxiety disorder, unspecified: Secondary | ICD-10-CM

## 2022-01-30 MED ORDER — PREGABALIN 75 MG PO CAPS: 75.0000 mg | ORAL_CAPSULE | Freq: Three times a day (TID) | ORAL | 2 refills | Status: DC

## 2022-02-02 ENCOUNTER — Ambulatory Visit (INDEPENDENT_AMBULATORY_CARE_PROVIDER_SITE_OTHER): Payer: PPO

## 2022-02-02 DIAGNOSIS — Z1231 Encounter for screening mammogram for malignant neoplasm of breast: Secondary | ICD-10-CM

## 2022-02-02 DIAGNOSIS — Z0000 Encounter for general adult medical examination without abnormal findings: Secondary | ICD-10-CM

## 2022-02-06 ENCOUNTER — Ambulatory Visit
Admission: EM | Admit: 2022-02-06 | Discharge: 2022-02-06 | Disposition: A | Discharge status: Home / Self Care | Payer: PPO | Attending: Family Medicine | Admitting: Family Medicine

## 2022-02-06 DIAGNOSIS — R059 Cough, unspecified: Secondary | ICD-10-CM | POA: Diagnosis not present

## 2022-02-06 DIAGNOSIS — J309 Allergic rhinitis, unspecified: Secondary | ICD-10-CM

## 2022-02-06 MED ORDER — PREDNISONE 20 MG PO TABS
ORAL_TABLET | ORAL | 0 refills | Status: DC
Start: 2022-02-06 — End: 2022-02-22

## 2022-02-06 MED ORDER — FEXOFENADINE HCL 180 MG PO TABS: 180.0000 mg | ORAL_TABLET | Freq: Every day | ORAL | 0 refills | Status: DC

## 2022-02-06 MED ORDER — CEFDINIR 300 MG PO CAPS: 300.0000 mg | ORAL_CAPSULE | Freq: Two times a day (BID) | ORAL | 0 refills | Status: AC

## 2022-02-06 MED ORDER — BENZONATATE 200 MG PO CAPS: 200.0000 mg | ORAL_CAPSULE | Freq: Three times a day (TID) | ORAL | 0 refills | Status: AC | PRN

## 2022-02-06 NOTE — Progress Notes (Unsigned)
   Pt no showed   Karina Loffler, DO

## 2022-02-06 NOTE — ED Provider Notes (Signed)
[name]    CSN: [id number] Arrival date & time: 02/06/22  1035      History   Chief Complaint Chief Complaint  Patient presents with   Cough    HPI [name] is a 66 y.o. female.   HPI Very pleasant 66 year old female presents with cough worsening for the past week.  Reports OTC cough suppressants have been on effective.  PMH significant for obesity, HTN, cancer, and asthma.  Past Medical History:  Diagnosis Date   Anxiety    Asthma 02/21/2018   Back pain    Cancer (HCC)    Depression    GERD (gastroesophageal reflux disease)    Hypertension     Patient Active Problem List   Diagnosis Date Noted   At risk for obstructive sleep apnea 03/26/2020   History of pulmonary embolus (PE) 03/26/2020   Basal cell carcinoma (BCC) of right ala nasi 02/21/2019   Basal cell carcinoma (BCC) of right upper eyelid 02/07/2019   Dysphagia 10/30/2018   History of esophageal dilatation 10/30/2018   Chronic right shoulder pain 10/15/2018   Asthma 02/21/2018   Paralyzed hemidiaphragm 02/21/2018   Elevated hemidiaphragm 02/05/2018   Atelectasis 02/05/2018   Anxiety 02/01/2018   Cervical neck pain with evidence of disc disease 02/01/2018   BMI 38.0-38.9,adult 02/01/2018   Cataract 04/01/2015   Depression 04/01/2015   Hyperlipidemia 04/01/2015   Hypertension 04/01/2015   Disorder of intervertebral disc of cervical spine 04/01/2015   Lumbar disc disease 04/01/2015   Hormone replacement therapy (postmenopausal) 07/21/2014   Fatty (change of) liver, not elsewhere classified 05/29/2012    Past Surgical History:  Procedure Laterality Date   ABDOMINAL HYSTERECTOMY     ARTHROSCOPY WITH ANTERIOR CRUCIATE LIGAMENT (ACL) REPAIR WITH ANTERIOR TIBILIAS GRAFT     BACK SURGERY     EXCISION MASS ABDOMINAL Left 11/12/2020   Procedure: EXCISION LEFT MASS ABDOMINAL;  Surgeon: Kieth Brightly, Arta Bruce, MD;  Location: [facility];  Service: General;  Laterality: Left;   FOOT SURGERY  Left    SHOULDER ARTHROSCOPY W/ ROTATOR CUFF REPAIR     SHOULDER OPEN ROTATOR CUFF REPAIR     TUBAL LIGATION      OB History   No obstetric history on file.      Home Medications    Prior to Admission medications   Medication Sig Start Date End Date Taking? Authorizing Provider  benzonatate (TESSALON) 200 MG capsule Take 1 capsule (200 mg total) by mouth 3 (three) times daily as needed for up to 7 days. 02/06/22 02/13/22 Yes Eliezer Lofts, FNP  cefdinir (OMNICEF) 300 MG capsule Take 1 capsule (300 mg total) by mouth 2 (two) times daily for 7 days. 02/06/22 02/13/22 Yes Eliezer Lofts, FNP  fexofenadine [facility] ALLERGY) 180 MG tablet Take 1 tablet (180 mg total) by mouth daily for 15 days. 02/06/22 02/21/22 Yes Eliezer Lofts, FNP  predniSONE (DELTASONE) 20 MG tablet Take 3 tabs PO daily x 5 days. 02/06/22  Yes Eliezer Lofts, FNP  albuterol (VENTOLIN HFA) 108 (90 Base) MCG/ACT inhaler Inhale 2 puffs into the lungs every 6 (six) hours as needed for wheezing or shortness of breath. Patient not taking: Reported on 02/06/2022 06/10/20   Martyn Ehrich, NP  buPROPion (WELLBUTRIN XL) 300 MG 24 hr tablet Take 1 tablet (300 mg total) by mouth daily. Patient not taking: Reported on 02/06/2022 12/30/21   Samuel Bouche, NP  CASCARA SAGRADA PO Take 1 capsule by mouth at bedtime.    [provider]  Cholecalciferol (VITAMIN D) 125 MCG (5000 UT) CAPS Take 5,000 Units by mouth daily.    [provider]  DULoxetine (CYMBALTA) 30 MG capsule Take 1 capsule (30 mg total) by mouth at bedtime. 01/27/22   Samuel Bouche, NP  DULoxetine (CYMBALTA) 60 MG capsule TAKE 1 CAPSULE BY MOUTH EVERY DAY 02/01/22   Samuel Bouche, NP  lisinopril (ZESTRIL) 5 MG tablet Take 1 tablet (5 mg total) by mouth daily. 12/30/21   Samuel Bouche, NP  meloxicam (MOBIC) 15 MG tablet Take 15 mg by mouth daily. 09/30/21   [provider]  omeprazole (PRILOSEC) 40 MG capsule TAKE 1 CAPSULE BY MOUTH EVERY DAY IN THE MORNING AND  AT BEDTIME 12/30/21   Samuel Bouche, NP  pregabalin (LYRICA) 75 MG capsule Take 1 capsule (75 mg total) by mouth 3 (three) times daily. 01/30/22   Samuel Bouche, NP  vitamin E 1000 UNIT capsule Take 1,000 Units by mouth daily.    [provider]    Family History Family History  Problem Relation Age of Onset   Cancer Mother        lung   Cancer Father        lung   Hypertension Father    Diabetes Father    Breast cancer Sister 21   Breast cancer Maternal Aunt    Colon cancer Maternal Aunt    Esophageal cancer Maternal Uncle    Breast cancer Cousin    Non-Hodgkin's lymphoma Son    Rectal cancer Neg Hx    Stomach cancer Neg Hx     Social History Social History   Tobacco Use   Smoking status: Never    Passive exposure: Yes   Smokeless tobacco: Never  Vaping Use   Vaping Use: Never used  Substance Use Topics   Alcohol use: Yes    Comment: occasionally   Drug use: Never     Allergies   Patient has no known allergies.   Review of Systems Review of Systems  Respiratory:  Positive for cough.   All other systems reviewed and are negative.    Physical Exam Triage Vital Signs ED Triage Vitals  Enc Vitals Group     BP 02/06/22 1049 121/73     Pulse Rate 02/06/22 1049 88     Resp 02/06/22 1049 14     Temp 02/06/22 1049 99.3 F (37.4 C)     Temp Source 02/06/22 1049 Oral     SpO2 02/06/22 1049 97 %     Weight --      Height --      Head Circumference --      Peak Flow --      Pain Score 02/06/22 1048 0     Pain Loc --      Pain Edu? --      Excl. in [place]? --    No data found.  Updated Vital Signs BP 121/73 (BP Location: Right Arm)   Pulse 88   Temp 99.3 F (37.4 C) (Oral)   Resp 14   SpO2 97%      Physical Exam Vitals and nursing note reviewed.  Constitutional:      General: She is not in acute distress.    Appearance: Normal appearance. She is normal weight. She is not ill-appearing.  HENT:     Head: Normocephalic and atraumatic.     Right  Ear: Tympanic membrane, ear canal and external ear normal.     Left Ear: Tympanic  membrane and ear canal normal.     Mouth/Throat:     Mouth: Mucous membranes are moist.     Pharynx: Oropharynx is clear.     Comments: Significant amount of clear drainage of posterior oropharynx noted Eyes:     Extraocular Movements: Extraocular movements intact.     Conjunctiva/sclera: Conjunctivae normal.     Pupils: Pupils are equal, round, and reactive to light.  Cardiovascular:     Rate and Rhythm: Normal rate and regular rhythm.     Pulses: Normal pulses.     Heart sounds: Normal heart sounds.  Pulmonary:     Effort: Pulmonary effort is normal.     Breath sounds: No wheezing, rhonchi or rales.     Comments: Infrequent nonproductive cough noted on exam Musculoskeletal:        General: Normal range of motion.     Cervical back: Normal range of motion and neck supple.  Skin:    General: Skin is warm and dry.  Neurological:     General: No focal deficit present.     Mental Status: She is alert and oriented to person, place, and time.      UC Treatments / Results  Labs (all labs ordered are listed, but only abnormal results are displayed) Labs Reviewed - No data to display  EKG   Radiology No results found.  Procedures Procedures (including critical care time)  Medications Ordered in UC Medications - No data to display  Initial Impression / Assessment and Plan / UC Course  I have reviewed the triage vital signs and the nursing notes.  Pertinent labs & imaging results that were available during my care of the patient were reviewed by me and considered in my medical decision making (see chart for details).     MDM: 1.  Cough-Rx cefdinir, prednisone, Tessalon Perles; 2.  Allergic rhinitis-next Allegra. Advised patient to take medication as directed with food to completion.  Instructed patient to take prednisone and Allegra with first dose of Omnicef for the next 5 of 7 days.   Advised may use Allegra as needed afterwards for concurrent postnasal drainage/drip.  Advised may use Tessalon Perles daily or as needed for cough.  Encouraged patient to increase daily water intake while taking these medications.  Advised if symptoms worsen and/or unresolved please follow-up with PCP or here for further evaluation.  Patient discharged home, hemodynamically stable. Final Clinical Impressions(s) / UC Diagnoses   Final diagnoses:  Cough, unspecified type  Allergic rhinitis, unspecified seasonality, unspecified trigger     Discharge Instructions      Advised patient to take medication as directed with food to completion.  Instructed patient to take prednisone and Allegra with first dose of Omnicef for the next 5 of 7 days.  Advised may use Allegra as needed afterwards for concurrent postnasal drainage/drip.  Advised may use Tessalon Perles daily or as needed for cough.  Encouraged patient to increase daily water intake while taking these medications.  Advised if symptoms worsen and/or unresolved please follow-up with PCP or here for further evaluation.     ED Prescriptions     Medication Sig Dispense Auth. Provider   cefdinir (OMNICEF) 300 MG capsule Take 1 capsule (300 mg total) by mouth 2 (two) times daily for 7 days. 14 capsule Eliezer Lofts, FNP   predniSONE (DELTASONE) 20 MG tablet Take 3 tabs PO daily x 5 days. 15 tablet Eliezer Lofts, FNP   fexofenadine [facility] ALLERGY) 180 MG tablet Take 1 tablet (  180 mg total) by mouth daily for 15 days. 15 tablet Eliezer Lofts, FNP   benzonatate (TESSALON) 200 MG capsule Take 1 capsule (200 mg total) by mouth 3 (three) times daily as needed for up to 7 days. 40 capsule Eliezer Lofts, FNP      PDMP not reviewed this encounter.   Eliezer Lofts, [place] 02/06/22 1153

## 2022-02-06 NOTE — ED Triage Notes (Signed)
Pt presents with c/o worsening cough greater than one week. OTC cough suppressants have been ineffective.

## 2022-02-06 NOTE — Discharge Instructions (Addendum)
Advised patient to take medication as directed with food to completion.  Instructed patient to take prednisone and Allegra with first dose of Omnicef for the next 5 of 7 days.  Advised may use Allegra as needed afterwards for concurrent postnasal drainage/drip.  Advised may use Tessalon Perles daily or as needed for cough.  Encouraged patient to increase daily water intake while taking these medications.  Advised if symptoms worsen and/or unresolved please follow-up with PCP or here for further evaluation.

## 2022-02-07 ENCOUNTER — Ambulatory Visit (INDEPENDENT_AMBULATORY_CARE_PROVIDER_SITE_OTHER): Payer: Self-pay | Admitting: Family Medicine

## 2022-02-07 ENCOUNTER — Encounter: Payer: Self-pay | Admitting: Medical-Surgical

## 2022-02-07 DIAGNOSIS — Z91199 Patient's noncompliance with other medical treatment and regimen due to unspecified reason: Secondary | ICD-10-CM

## 2022-02-16 DIAGNOSIS — H353132 Nonexudative age-related macular degeneration, bilateral, intermediate dry stage: Secondary | ICD-10-CM | POA: Diagnosis not present

## 2022-02-16 DIAGNOSIS — H2513 Age-related nuclear cataract, bilateral: Secondary | ICD-10-CM | POA: Diagnosis not present

## 2022-02-22 ENCOUNTER — Ambulatory Visit
Admission: EM | Admit: 2022-02-22 | Discharge: 2022-02-22 | Disposition: A | Discharge status: Home / Self Care | Payer: PPO | Attending: Family Medicine | Admitting: Family Medicine

## 2022-02-22 ENCOUNTER — Encounter: Payer: Self-pay | Admitting: Emergency Medicine

## 2022-02-22 ENCOUNTER — Ambulatory Visit (INDEPENDENT_AMBULATORY_CARE_PROVIDER_SITE_OTHER): Payer: PPO

## 2022-02-22 DIAGNOSIS — R0789 Other chest pain: Secondary | ICD-10-CM

## 2022-02-22 DIAGNOSIS — R059 Cough, unspecified: Secondary | ICD-10-CM

## 2022-02-22 DIAGNOSIS — J4521 Mild intermittent asthma with (acute) exacerbation: Secondary | ICD-10-CM

## 2022-02-22 DIAGNOSIS — R051 Acute cough: Secondary | ICD-10-CM

## 2022-02-22 DIAGNOSIS — R079 Chest pain, unspecified: Secondary | ICD-10-CM | POA: Diagnosis not present

## 2022-02-22 DIAGNOSIS — J45909 Unspecified asthma, uncomplicated: Secondary | ICD-10-CM | POA: Diagnosis not present

## 2022-02-22 MED ORDER — HYDROCOD POLI-CHLORPHE POLI ER 10-8 MG/5ML PO SUER: 5.0000 mL | Freq: Two times a day (BID) | ORAL | 0 refills | Status: DC | PRN

## 2022-02-22 MED ORDER — PREDNISONE 20 MG PO TABS: 40.0000 mg | ORAL_TABLET | Freq: Every day | ORAL | 0 refills | Status: DC

## 2022-02-22 MED ORDER — DOXYCYCLINE HYCLATE 100 MG PO CAPS: 100.0000 mg | ORAL_CAPSULE | Freq: Two times a day (BID) | ORAL | 0 refills | Status: DC

## 2022-02-22 NOTE — Discharge Instructions (Addendum)
Continue to drink lots of water Take the antibiotic 2 times a day.  It is important to take this antibiotic with food Take prednisone once a day for 5 days Use your albuterol inhaler if needed for shortness of breath I have prescribed a stronger cough medicine.  It can be taken twice a day.  It will help you sleep at night See your doctor if not improving by next week

## 2022-02-22 NOTE — ED Provider Notes (Signed)
[name]    CSN: [id number] Arrival date & time: 02/22/22  1158      History   Chief Complaint Chief Complaint  Patient presents with   Cough    HPI [name] is a 66 y.o. female.   HPI  Patient had close exposure to COVID.  She started having symptoms last Friday.  It is now Wednesday and she is on day 5-6 of cough, chest congestion, chest pain, fatigue, shortness of breath.  Chest hurts with coughing.  Decreased appetite.  Has not noted any sweats chills or fever.  Has underlying asthma.  Was up all night coughing.  Has used her albuterol inhaler and has taken Tessalon pills without improvement  Past Medical History:  Diagnosis Date   Anxiety    Asthma 02/21/2018   Back pain    Cancer (HCC)    Depression    GERD (gastroesophageal reflux disease)    Hypertension     Patient Active Problem List   Diagnosis Date Noted   At risk for obstructive sleep apnea 03/26/2020   History of pulmonary embolus (PE) 03/26/2020   Basal cell carcinoma (BCC) of right ala nasi 02/21/2019   Basal cell carcinoma (BCC) of right upper eyelid 02/07/2019   Dysphagia 10/30/2018   History of esophageal dilatation 10/30/2018   Chronic right shoulder pain 10/15/2018   Asthma 02/21/2018   Paralyzed hemidiaphragm 02/21/2018   Elevated hemidiaphragm 02/05/2018   Atelectasis 02/05/2018   Anxiety 02/01/2018   Cervical neck pain with evidence of disc disease 02/01/2018   BMI 38.0-38.9,adult 02/01/2018   Cataract 04/01/2015   Depression 04/01/2015   Hyperlipidemia 04/01/2015   Hypertension 04/01/2015   Disorder of intervertebral disc of cervical spine 04/01/2015   Lumbar disc disease 04/01/2015   Hormone replacement therapy (postmenopausal) 07/21/2014   Fatty (change of) liver, not elsewhere classified 05/29/2012    Past Surgical History:  Procedure Laterality Date   ABDOMINAL HYSTERECTOMY     ARTHROSCOPY WITH ANTERIOR CRUCIATE LIGAMENT (ACL) REPAIR WITH ANTERIOR TIBILIAS  GRAFT     BACK SURGERY     EXCISION MASS ABDOMINAL Left 11/12/2020   Procedure: EXCISION LEFT MASS ABDOMINAL;  Surgeon: Kieth Brightly, Arta Bruce, MD;  Location: [facility];  Service: General;  Laterality: Left;   FOOT SURGERY Left    SHOULDER ARTHROSCOPY W/ ROTATOR CUFF REPAIR     SHOULDER OPEN ROTATOR CUFF REPAIR     TUBAL LIGATION      OB History   No obstetric history on file.      Home Medications    Prior to Admission medications   Medication Sig Start Date End Date Taking? Authorizing Provider  chlorpheniramine-HYDROcodone (TUSSIONEX) 10-8 MG/5ML Take 5 mLs by mouth every 12 (twelve) hours as needed for cough. 02/22/22  Yes Raylene Everts, MD  doxycycline (VIBRAMYCIN) 100 MG capsule Take 1 capsule (100 mg total) by mouth 2 (two) times daily. 02/22/22  Yes Raylene Everts, MD  predniSONE (DELTASONE) 20 MG tablet Take 2 tablets (40 mg total) by mouth daily with breakfast. 02/22/22  Yes Raylene Everts, MD  albuterol (VENTOLIN HFA) 108 (90 Base) MCG/ACT inhaler Inhale 2 puffs into the lungs every 6 (six) hours as needed for wheezing or shortness of breath. Patient not taking: Reported on 02/06/2022 06/10/20   Martyn Ehrich, NP  CASCARA SAGRADA PO Take 1 capsule by mouth at bedtime.    [provider]  Cholecalciferol (VITAMIN D) 125 MCG (5000 UT) CAPS Take 5,000 Units by  mouth daily.    [provider]  DULoxetine (CYMBALTA) 30 MG capsule Take 1 capsule (30 mg total) by mouth at bedtime. 01/27/22   Samuel Bouche, NP  DULoxetine (CYMBALTA) 60 MG capsule TAKE 1 CAPSULE BY MOUTH EVERY DAY 02/01/22   Samuel Bouche, NP  lisinopril (ZESTRIL) 5 MG tablet Take 1 tablet (5 mg total) by mouth daily. 12/30/21   Samuel Bouche, NP  meloxicam (MOBIC) 15 MG tablet Take 15 mg by mouth daily. 09/30/21   [provider]  omeprazole (PRILOSEC) 40 MG capsule TAKE 1 CAPSULE BY MOUTH EVERY DAY IN THE MORNING AND AT BEDTIME 12/30/21   Samuel Bouche, NP  pregabalin (LYRICA) 75 MG capsule  Take 1 capsule (75 mg total) by mouth 3 (three) times daily. 01/30/22   Samuel Bouche, NP  vitamin E 1000 UNIT capsule Take 1,000 Units by mouth daily.    [provider]    Family History Family History  Problem Relation Age of Onset   Cancer Mother        lung   Cancer Father        lung   Hypertension Father    Diabetes Father    Breast cancer Sister 42   Breast cancer Maternal Aunt    Colon cancer Maternal Aunt    Esophageal cancer Maternal Uncle    Breast cancer Cousin    Non-Hodgkin's lymphoma Son    Rectal cancer Neg Hx    Stomach cancer Neg Hx     Social History Social History   Tobacco Use   Smoking status: Never    Passive exposure: Yes   Smokeless tobacco: Never  Vaping Use   Vaping Use: Never used  Substance Use Topics   Alcohol use: Yes    Comment: occasionally   Drug use: Never     Allergies   Patient has no known allergies.   Review of Systems Review of Systems See HPI  Physical Exam Triage Vital Signs ED Triage Vitals [02/22/22 1213]  Enc Vitals Group     BP (!) 163/83     Pulse Rate 89     Resp 18     Temp 98.7 F (37.1 C)     Temp Source Oral     SpO2 98 %     Weight      Height      Head Circumference      Peak Flow      Pain Score 0     Pain Loc      Pain Edu?      Excl. in [place]?    No data found.  Updated Vital Signs BP (!) 163/83 (BP Location: Right Arm)   Pulse 89   Temp 98.7 F (37.1 C) (Oral)   Resp 18   SpO2 98%       Physical Exam Constitutional:      General: She is not in acute distress.    Appearance: She is well-developed and normal weight. She is ill-appearing.  HENT:     Head: Normocephalic and atraumatic.     Right Ear: Tympanic membrane and ear canal normal.     Left Ear: Tympanic membrane and ear canal normal.     Nose: Nose normal. No congestion.     Mouth/Throat:     Mouth: Mucous membranes are moist.     Pharynx: Posterior oropharyngeal erythema present.  Eyes:      Conjunctiva/sclera: Conjunctivae normal.     Pupils: Pupils are equal,  round, and reactive to light.  Cardiovascular:     Rate and Rhythm: Normal rate and regular rhythm.     Heart sounds: Normal heart sounds.  Pulmonary:     Effort: Pulmonary effort is normal. No respiratory distress.     Breath sounds: Wheezing and rhonchi present.  Abdominal:     General: There is no distension.     Palpations: Abdomen is soft.  Musculoskeletal:        General: Normal range of motion.     Cervical back: Normal range of motion.  Skin:    General: Skin is warm and dry.  Neurological:     Mental Status: She is alert.  Psychiatric:        Mood and Affect: Mood normal.        Behavior: Behavior normal.      UC Treatments / Results  Labs (all labs ordered are listed, but only abnormal results are displayed) Labs Reviewed - No data to display  EKG   Radiology DG Chest 2 View  Result Date: 02/22/2022 CLINICAL DATA:  Cough, asthma, chest pain. EXAM: CHEST - 2 VIEW COMPARISON:  Chest radiographs 02/09/2020 and 07/01/2018; CT chest 05/20/2020 FINDINGS: Cardiac silhouette and mediastinal contours are within normal limits. Mild calcification within aortic arch. Moderate elevation of the right hemidiaphragm is unchanged. No pleural effusion or pneumothorax. Mild multilevel degenerative disc changes of the thoracic spine. IMPRESSION: 1. No active cardiopulmonary disease. 2. Unchanged moderate elevation of the right hemidiaphragm. Electronically Signed   By: Nikisha Fleece Kendall M.D.   On: 02/22/2022 12:40    Procedures Procedures (including critical care time)  Medications Ordered in UC Medications - No data to display  Initial Impression / Assessment and Plan / UC Course  I have reviewed the triage vital signs and the nursing notes.  Pertinent labs & imaging results that were available during my care of the patient were reviewed by me and considered in my medical decision making (see chart for  details).     X-ray is negative.  Elevation of right hemidiaphragm is demonstrated similar to previous. Final Clinical Impressions(s) / UC Diagnoses   Final diagnoses:  Mild intermittent asthma with acute exacerbation  Acute cough     Discharge Instructions      Continue to drink lots of water Take the antibiotic 2 times a day.  It is important to take this antibiotic with food Take prednisone once a day for 5 days Use your albuterol inhaler if needed for shortness of breath I have prescribed a stronger cough medicine.  It can be taken twice a day.  It will help you sleep at night See your doctor if not improving by next week     ED Prescriptions     Medication Sig Dispense Auth. Provider   predniSONE (DELTASONE) 20 MG tablet Take 2 tablets (40 mg total) by mouth daily with breakfast. 10 tablet Raylene Everts, MD   doxycycline (VIBRAMYCIN) 100 MG capsule Take 1 capsule (100 mg total) by mouth 2 (two) times daily. 14 capsule Raylene Everts, MD   chlorpheniramine-HYDROcodone (TUSSIONEX) 10-8 MG/5ML Take 5 mLs by mouth every 12 (twelve) hours as needed for cough. 115 mL Raylene Everts, MD      I have reviewed the PDMP during this encounter.   Raylene Everts, MD 02/22/22 [phone]

## 2022-02-22 NOTE — ED Triage Notes (Signed)
Pt c/o cough and congestion for 4 days. States she had a negative covid test at home a couple days ago. Taking tessalon pearls with no relief.

## 2022-02-23 ENCOUNTER — Inpatient Hospital Stay: Payer: PPO | Admitting: Hematology & Oncology

## 2022-02-23 ENCOUNTER — Inpatient Hospital Stay: Payer: PPO

## 2022-02-23 ENCOUNTER — Other Ambulatory Visit: Payer: Self-pay | Admitting: Medical-Surgical

## 2022-02-27 ENCOUNTER — Encounter: Payer: Self-pay | Admitting: Medical-Surgical

## 2022-03-08 ENCOUNTER — Inpatient Hospital Stay: Payer: PPO | Admitting: Hematology & Oncology

## 2022-03-08 ENCOUNTER — Inpatient Hospital Stay: Payer: PPO | Attending: Hematology & Oncology

## 2022-03-08 VITALS — BP 134/80 | HR 83 | Temp 97.8°F | Resp 17 | Ht 64.0 in | Wt 206.0 lb

## 2022-03-08 DIAGNOSIS — C44311 Basal cell carcinoma of skin of nose: Secondary | ICD-10-CM

## 2022-03-08 DIAGNOSIS — I10 Essential (primary) hypertension: Secondary | ICD-10-CM

## 2022-03-08 DIAGNOSIS — Z791 Long term (current) use of non-steroidal anti-inflammatories (NSAID): Secondary | ICD-10-CM | POA: Insufficient documentation

## 2022-03-08 DIAGNOSIS — Z86711 Personal history of pulmonary embolism: Secondary | ICD-10-CM | POA: Diagnosis not present

## 2022-03-08 DIAGNOSIS — Z7982 Long term (current) use of aspirin: Secondary | ICD-10-CM | POA: Diagnosis not present

## 2022-03-08 LAB — CBC WITH DIFFERENTIAL (CANCER CENTER ONLY)
Abs Immature Granulocytes: 0.02 10*3/uL (ref 0.00–0.07)
Basophils Absolute: 0.1 10*3/uL (ref 0.0–0.1)
Basophils Relative: 1 %
Eosinophils Absolute: 0.2 10*3/uL (ref 0.0–0.5)
Eosinophils Relative: 3 %
HCT: 45.8 % (ref 36.0–46.0)
Hemoglobin: 14.8 g/dL (ref 12.0–15.0)
Immature Granulocytes: 0 %
Lymphocytes Relative: 42 %
Lymphs Abs: 2.9 10*3/uL (ref 0.7–4.0)
MCH: 32.9 pg (ref 26.0–34.0)
MCHC: 32.3 g/dL (ref 30.0–36.0)
MCV: 101.8 fL — ABNORMAL HIGH (ref 80.0–100.0)
Monocytes Absolute: 0.6 10*3/uL (ref 0.1–1.0)
Monocytes Relative: 9 %
Neutro Abs: 3.1 10*3/uL (ref 1.7–7.7)
Neutrophils Relative %: 45 %
Platelet Count: 253 10*3/uL (ref 150–400)
RBC: 4.5 MIL/uL (ref 3.87–5.11)
RDW: 14.4 % (ref 11.5–15.5)
WBC Count: 6.9 10*3/uL (ref 4.0–10.5)
nRBC: 0 % (ref 0.0–0.2)

## 2022-03-08 LAB — CMP (CANCER CENTER ONLY)
ALT: 19 U/L (ref 0–44)
AST: 26 U/L (ref 15–41)
Albumin: 3.9 g/dL (ref 3.5–5.0)
Alkaline Phosphatase: 72 U/L (ref 38–126)
Anion gap: 8 (ref 5–15)
BUN: 16 mg/dL (ref 8–23)
CO2: 31 mmol/L (ref 22–32)
Calcium: 9.6 mg/dL (ref 8.9–10.3)
Chloride: 105 mmol/L (ref 98–111)
Creatinine: 1.1 mg/dL — ABNORMAL HIGH (ref 0.44–1.00)
GFR, Estimated: 55 mL/min — ABNORMAL LOW (ref >60)
Glucose, Bld: 98 mg/dL (ref 70–99)
Potassium: 4.9 mmol/L (ref 3.5–5.1)
Sodium: 144 mmol/L (ref 135–145)
Total Bilirubin: 0.6 mg/dL (ref 0.3–1.2)
Total Protein: 6.6 g/dL (ref 6.5–8.1)

## 2022-03-08 LAB — D-DIMER, QUANTITATIVE: D-Dimer, Quant: 1.18 ug/mL-FEU — ABNORMAL HIGH (ref 0.00–0.50)

## 2022-03-08 NOTE — Progress Notes (Signed)
Hematology and Oncology Follow Up Visit  [name] [id number] 1956/03/26 66 y.o. 03/08/2022   Principle Diagnosis:  Pulmonary embolism -- dx 'ed in 01/2020   Past Work-up: Hypercoag panel negative   Current Therapy: Eliquis 2.5 mg PO BID -- complete 1 yr of therapy in 07/2021 [facility] ASA 162 mg po q day --  start on 07/29/2021   Interim History:  [name] is here today with her husband for follow-up.  Unfortunately, he is going to probably need to have back surgery.  He is going for an MRI today.  She now has macular degeneration.  She is on vitamins for this right now.  She is doing well on the aspirin.  She stopped the Eliquis back in March.  She had no problems with the aspirin.  She has had no problems with cough or shortness of breath.  There is been no chest wall pain.    She continues to lose weight.  She really do a good job doing this..  She has had a mammogram recently.  There is no look fine on the mammogram.  She has had no fever.  She has had no issues with COVID.  I think a sister had a COVID over the summer.  Currently, I would say performance status is probably ECOG 1.    We will she had a good life Medications:  Allergies as of 03/08/2022   No Known Allergies      Medication List        Accurate as of March 08, 2022  8:07 AM. If you have any questions, ask your nurse or doctor.          albuterol 108 (90 Base) MCG/ACT inhaler Commonly known as: VENTOLIN HFA Inhale 2 puffs into the lungs every 6 (six) hours as needed for wheezing or shortness of breath.   CASCARA SAGRADA PO Take 1 capsule by mouth at bedtime.   chlorpheniramine-HYDROcodone 10-8 MG/5ML Commonly known as: TUSSIONEX Take 5 mLs by mouth every 12 (twelve) hours as needed for cough.   doxycycline 100 MG capsule Commonly known as: VIBRAMYCIN Take 1 capsule (100 mg total) by mouth 2 (two) times daily.   DULoxetine 30 MG capsule Commonly known as: Cymbalta Take 1 capsule (30 mg  total) by mouth at bedtime.   DULoxetine 60 MG capsule Commonly known as: CYMBALTA TAKE 1 CAPSULE BY MOUTH EVERY DAY   lisinopril 5 MG tablet Commonly known as: ZESTRIL Take 1 tablet (5 mg total) by mouth daily.   meloxicam 15 MG tablet Commonly known as: MOBIC Take 15 mg by mouth daily.   omeprazole 40 MG capsule Commonly known as: PRILOSEC TAKE 1 CAPSULE BY MOUTH EVERY DAY IN THE MORNING AND AT BEDTIME   predniSONE 20 MG tablet Commonly known as: DELTASONE Take 2 tablets (40 mg total) by mouth daily with breakfast.   pregabalin 75 MG capsule Commonly known as: LYRICA Take 1 capsule (75 mg total) by mouth 3 (three) times daily.   Vitamin D 125 MCG (5000 UT) Caps Take 5,000 Units by mouth daily.   vitamin E 1000 UNIT capsule Take 1,000 Units by mouth daily.        Allergies: No Known Allergies  Past Medical History, Surgical history, Social history, and Family History were reviewed and updated.  Review of Systems: Review of Systems  Constitutional: Negative.   HENT: Negative.    Eyes: Negative.   Respiratory: Negative.    Cardiovascular: Negative.   Gastrointestinal: Negative.  Genitourinary: Negative.   Musculoskeletal: Negative.   Skin: Negative.   Neurological: Negative.   Endo/Heme/Allergies: Negative.   Psychiatric/Behavioral: Negative.       Physical Exam:  vitals were not taken for this visit.   Wt Readings from Last 3 Encounters:  12/30/21 210 lb 11.2 oz (95.6 kg)  10/19/21 205 lb (93 kg)  07/28/21 214 lb 1.3 oz (97.1 kg)    Physical Exam Vitals reviewed.  HENT:     Head: Normocephalic and atraumatic.  Eyes:     Pupils: Pupils are equal, round, and reactive to light.  Cardiovascular:     Rate and Rhythm: Normal rate and regular rhythm.     Heart sounds: Normal heart sounds.  Pulmonary:     Effort: Pulmonary effort is normal.     Breath sounds: Normal breath sounds.  Abdominal:     General: Bowel sounds are normal.      Palpations: Abdomen is soft.  Musculoskeletal:        General: No tenderness or deformity. Normal range of motion.     Cervical back: Normal range of motion.  Lymphadenopathy:     Cervical: No cervical adenopathy.  Skin:    General: Skin is warm and dry.     Findings: No erythema or rash.  Neurological:     Mental Status: She is alert and oriented to person, place, and time.  Psychiatric:        Behavior: Behavior normal.        Thought Content: Thought content normal.        Judgment: Judgment normal.     Lab Results  Component Value Date   WBC 6.9 03/08/2022   HGB 14.8 03/08/2022   HCT 45.8 03/08/2022   MCV 101.8 (H) 03/08/2022   PLT 253 03/08/2022   Lab Results  Component Value Date   FERRITIN 439 (H) 04/20/2020   IRON 88 04/20/2020   TIBC 223 (L) 04/20/2020   UIBC 135 04/20/2020   IRONPCTSAT 39 04/20/2020   Lab Results  Component Value Date   RETICCTPCT 2.2 04/20/2020   RBC 4.50 03/08/2022   No results found for: "KPAFRELGTCHN", "LAMBDASER", "KAPLAMBRATIO" No results found for: "IGGSERUM", "IGA", "IGMSERUM" No results found for: "TOTALPROTELP", "ALBUMINELP", "A1GS", "A2GS", "BETS", "BETA2SER", "GAMS", "MSPIKE", "SPEI"   Chemistry      Component Value Date/Time   NA 139 07/28/2021 0929   K 5.5 (H) 07/28/2021 0929   CL 104 07/28/2021 0929   CO2 29 07/28/2021 0929   BUN 20 07/28/2021 0929   CREATININE 1.01 (H) 07/28/2021 0929   CREATININE 0.78 02/09/2020 1415      Component Value Date/Time   CALCIUM 9.1 07/28/2021 0929   ALKPHOS 63 07/28/2021 0929   AST 31 07/28/2021 0929   ALT 21 07/28/2021 0929   BILITOT 0.7 07/28/2021 0929       Impression and Plan: [name] is a very pleasant 66 yo caucasian female with recent diagnosis of small right upper lobe pulmonary embolus.  This appears to be idiopathic.  We will continue to follow her along.  Of note, she is on meloxicam now.  She is having foot issues.  The meloxicam does seem to help.  We will see  what her kidney function is.  We will still plan to get her back every 6 months for follow-up for right now.   Volanda Napoleon, MD 10/18/20238:07 AM

## 2022-03-12 ENCOUNTER — Encounter: Payer: Self-pay | Admitting: Hematology & Oncology

## 2022-03-26 ENCOUNTER — Encounter: Payer: Self-pay | Admitting: Medical-Surgical

## 2022-03-27 MED ORDER — MELOXICAM 15 MG PO TABS: 15.0000 mg | ORAL_TABLET | Freq: Every day | ORAL | 1 refills | Status: DC

## 2022-03-27 NOTE — Telephone Encounter (Signed)
Prescription sent. Looks like it was originally sent by Dr. Lucia Gaskins with Ortho.  ___________________________________________ Karina Sorrel, DNP, APRN, FNP-BC [facility] [place]

## 2022-03-30 ENCOUNTER — Encounter: Payer: Self-pay | Admitting: Emergency Medicine

## 2022-03-30 ENCOUNTER — Ambulatory Visit
Admission: EM | Admit: 2022-03-30 | Discharge: 2022-03-30 | Disposition: A | Discharge status: Home / Self Care | Payer: PPO | Attending: Family Medicine | Admitting: Family Medicine

## 2022-03-30 DIAGNOSIS — R059 Cough, unspecified: Secondary | ICD-10-CM | POA: Diagnosis not present

## 2022-03-30 DIAGNOSIS — J40 Bronchitis, not specified as acute or chronic: Secondary | ICD-10-CM

## 2022-03-30 MED ORDER — PREDNISONE 20 MG PO TABS: ORAL_TABLET | ORAL | 0 refills | Status: DC

## 2022-03-30 MED ORDER — DOXYCYCLINE HYCLATE 100 MG PO CAPS: 100.0000 mg | ORAL_CAPSULE | Freq: Two times a day (BID) | ORAL | 0 refills | Status: AC

## 2022-03-30 MED ORDER — BENZONATATE 200 MG PO CAPS: 200.0000 mg | ORAL_CAPSULE | Freq: Three times a day (TID) | ORAL | 0 refills | Status: AC | PRN

## 2022-03-30 MED ORDER — HYDROCOD POLI-CHLORPHE POLI ER 10-8 MG/5ML PO SUER: 5.0000 mL | Freq: Two times a day (BID) | ORAL | 0 refills | Status: DC | PRN

## 2022-03-30 NOTE — Discharge Instructions (Addendum)
Advised patient to take medication as directed with food to completion.  Advised patient to take prednisone with first dose of doxycycline for the next 5 of 7 days.  Advised may use Tessalon daily or as needed for cough.  Advised patient to use Tussionex at night prior to sleep due to sedative  effects.  Encouraged patient to increase daily water intake to 64 ounces per day while taking these medications.  Advised if symptoms worsen and/or unresolved please follow-up with PCP or here for further evaluation.

## 2022-03-30 NOTE — ED Triage Notes (Signed)
Cough x 1 week  Used inhaler the last 2 days  Cough worse at night - can't sleep Tessalon perles at home - min relief Liquid  cough syrup from last visit woked the best Denies fever

## 2022-03-30 NOTE — ED Provider Notes (Signed)
[name]    CSN: [id number] Arrival date & time: 03/30/22  0831      History   Chief Complaint Chief Complaint  Patient presents with   Cough    HPI [name] is a 66 y.o. female.   HPI Very pleasant 66 year old female presents with cough for 1 week.  Patient reports cough is worse at night and cannot sleep.  Reports liquid cough syrup from last visit worked the best.  Additionally, patient reports of voice loss in the past 2 days.  PMH significant for HTN, history of pulmonary embolism, cancer and asthma.  Past Medical History:  Diagnosis Date   Anxiety    Asthma 02/21/2018   Back pain    Cancer (HCC)    Depression    GERD (gastroesophageal reflux disease)    Hypertension     Patient Active Problem List   Diagnosis Date Noted   At risk for obstructive sleep apnea 03/26/2020   History of pulmonary embolus (PE) 03/26/2020   Basal cell carcinoma (BCC) of right ala nasi 02/21/2019   Basal cell carcinoma (BCC) of right upper eyelid 02/07/2019   Dysphagia 10/30/2018   History of esophageal dilatation 10/30/2018   Chronic right shoulder pain 10/15/2018   Asthma 02/21/2018   Paralyzed hemidiaphragm 02/21/2018   Elevated hemidiaphragm 02/05/2018   Atelectasis 02/05/2018   Anxiety 02/01/2018   Cervical neck pain with evidence of disc disease 02/01/2018   BMI 38.0-38.9,adult 02/01/2018   Cataract 04/01/2015   Depression 04/01/2015   Hyperlipidemia 04/01/2015   Hypertension 04/01/2015   Disorder of intervertebral disc of cervical spine 04/01/2015   Lumbar disc disease 04/01/2015   Hormone replacement therapy (postmenopausal) 07/21/2014   Fatty (change of) liver, not elsewhere classified 05/29/2012    Past Surgical History:  Procedure Laterality Date   ABDOMINAL HYSTERECTOMY     ARTHROSCOPY WITH ANTERIOR CRUCIATE LIGAMENT (ACL) REPAIR WITH ANTERIOR TIBILIAS GRAFT     BACK SURGERY     EXCISION MASS ABDOMINAL Left 11/12/2020   Procedure: EXCISION  LEFT MASS ABDOMINAL;  Surgeon: Kieth Brightly, Arta Bruce, MD;  Location: [facility];  Service: General;  Laterality: Left;   FOOT SURGERY Left    SHOULDER ARTHROSCOPY W/ ROTATOR CUFF REPAIR     SHOULDER OPEN ROTATOR CUFF REPAIR     TUBAL LIGATION      OB History   No obstetric history on file.      Home Medications    Prior to Admission medications   Medication Sig Start Date End Date Taking? Authorizing Provider  benzonatate (TESSALON) 200 MG capsule Take 1 capsule (200 mg total) by mouth 3 (three) times daily as needed for up to 7 days. 03/30/22 04/06/22 Yes Eliezer Lofts, FNP  chlorpheniramine-HYDROcodone (TUSSIONEX) 10-8 MG/5ML Take 5 mLs by mouth every 12 (twelve) hours as needed for cough. 03/30/22  Yes Eliezer Lofts, FNP  doxycycline (VIBRAMYCIN) 100 MG capsule Take 1 capsule (100 mg total) by mouth 2 (two) times daily for 7 days. 03/30/22 04/06/22 Yes Eliezer Lofts, FNP  predniSONE (DELTASONE) 20 MG tablet Take 3 tabs PO daily x 5 days. 03/30/22  Yes Eliezer Lofts, FNP  albuterol (VENTOLIN HFA) 108 (90 Base) MCG/ACT inhaler Inhale 2 puffs into the lungs every 6 (six) hours as needed for wheezing or shortness of breath. Patient not taking: Reported on 02/06/2022 06/10/20   Martyn Ehrich, NP  CASCARA SAGRADA PO Take 1 capsule by mouth at bedtime.    [provider]  Cholecalciferol (VITAMIN D)  125 MCG (5000 UT) CAPS Take 5,000 Units by mouth daily.    [provider]  DULoxetine (CYMBALTA) 30 MG capsule Take 1 capsule (30 mg total) by mouth at bedtime. 01/27/22   Samuel Bouche, NP  DULoxetine (CYMBALTA) 60 MG capsule TAKE 1 CAPSULE BY MOUTH EVERY DAY 02/01/22   Samuel Bouche, NP  lisinopril (ZESTRIL) 5 MG tablet Take 1 tablet (5 mg total) by mouth daily. 12/30/21   Samuel Bouche, NP  meloxicam (MOBIC) 15 MG tablet Take 1 tablet (15 mg total) by mouth daily. 03/27/22   Samuel Bouche, NP  omeprazole (PRILOSEC) 40 MG capsule TAKE 1 CAPSULE BY MOUTH EVERY DAY IN THE MORNING AND AT  BEDTIME 12/30/21   Samuel Bouche, NP  pregabalin (LYRICA) 75 MG capsule Take 1 capsule (75 mg total) by mouth 3 (three) times daily. 01/30/22   Samuel Bouche, NP  vitamin E 1000 UNIT capsule Take 1,000 Units by mouth daily.    [provider]    Family History Family History  Problem Relation Age of Onset   Cancer Mother        lung   Cancer Father        lung   Hypertension Father    Diabetes Father    Breast cancer Sister 85   Breast cancer Maternal Aunt    Colon cancer Maternal Aunt    Esophageal cancer Maternal Uncle    Breast cancer Cousin    Non-Hodgkin's lymphoma Son    Rectal cancer Neg Hx    Stomach cancer Neg Hx     Social History Social History   Tobacco Use   Smoking status: Never    Passive exposure: Yes   Smokeless tobacco: Never  Vaping Use   Vaping Use: Never used  Substance Use Topics   Alcohol use: Yes    Comment: occasionally   Drug use: Never     Allergies   Patient has no known allergies.   Review of Systems Review of Systems  HENT:  Positive for congestion and voice change.   Respiratory:  Positive for cough.   All other systems reviewed and are negative.    Physical Exam Triage Vital Signs ED Triage Vitals  Enc Vitals Group     BP 03/30/22 0924 132/85     Pulse Rate 03/30/22 0924 93     Resp 03/30/22 0924 20     Temp --      Temp Source 03/30/22 0924 Tympanic     SpO2 03/30/22 0924 96 %     Weight 03/30/22 0925 201 lb (91.2 kg)     Height 03/30/22 0925 '5\' 4"'$  (1.626 m)     Head Circumference --      Peak Flow --      Pain Score 03/30/22 0925 0     Pain Loc --      Pain Edu? --      Excl. in [place]? --    No data found.  Updated Vital Signs BP 132/85 (BP Location: Right Arm)   Pulse 93   Temp 98.6 F (37 C) (Oral)   Resp 20   Ht '5\' 4"'$  (1.626 m)   Wt 201 lb (91.2 kg)   SpO2 96%   BMI 34.50 kg/m      Physical Exam Vitals and nursing note reviewed.  Constitutional:      Appearance: Normal appearance. She is  obese. She is ill-appearing.  HENT:     Head: Normocephalic and atraumatic.  Right Ear: Tympanic membrane, ear canal and external ear normal.     Left Ear: Tympanic membrane, ear canal and external ear normal.     Mouth/Throat:     Mouth: Mucous membranes are moist.     Pharynx: Oropharynx is clear. Posterior oropharyngeal erythema present.  Eyes:     Extraocular Movements: Extraocular movements intact.     Conjunctiva/sclera: Conjunctivae normal.     Pupils: Pupils are equal, round, and reactive to light.  Cardiovascular:     Rate and Rhythm: Normal rate and regular rhythm.     Pulses: Normal pulses.     Heart sounds: Normal heart sounds.  Pulmonary:     Effort: Pulmonary effort is normal.     Breath sounds: Rhonchi present. No wheezing or rales.     Comments: Diffuse scattered rhonchi throughout, infrequent nonproductive cough noted on exam Musculoskeletal:        General: Normal range of motion.     Cervical back: Normal range of motion and neck supple.  Skin:    General: Skin is warm and dry.  Neurological:     General: No focal deficit present.     Mental Status: She is alert and oriented to person, place, and time.      UC Treatments / Results  Labs (all labs ordered are listed, but only abnormal results are displayed) Labs Reviewed - No data to display  EKG   Radiology No results found.  Procedures Procedures (including critical care time)  Medications Ordered in UC Medications - No data to display  Initial Impression / Assessment and Plan / UC Course  I have reviewed the triage vital signs and the nursing notes.  Pertinent labs & imaging results that were available during my care of the patient were reviewed by me and considered in my medical decision making (see chart for details).     MDM: 1.  Cough-Rx'd Doxycycline, Tessalon, Tussionex; 2.  Bronchitis-Rx'd prednisone. Advised patient to take medication as directed with food to completion.  Advised  patient to take prednisone with first dose of doxycycline for the next 5 of 7 days.  Advised may use Tessalon daily or as needed for cough.  Advised patient to use Tussionex at night prior to sleep due to sedative  effects.  Encouraged patient to increase daily water intake to 64 ounces per day while taking these medications.  Advised if symptoms worsen and/or unresolved please follow-up with PCP or here for further evaluation. Final Clinical Impressions(s) / UC Diagnoses   Final diagnoses:  Bronchitis  Cough, unspecified type     Discharge Instructions      Advised patient to take medication as directed with food to completion.  Advised patient to take prednisone with first dose of doxycycline for the next 5 of 7 days.  Advised may use Tessalon daily or as needed for cough.  Advised patient to use Tussionex at night prior to sleep due to sedative  effects.  Encouraged patient to increase daily water intake to 64 ounces per day while taking these medications.  Advised if symptoms worsen and/or unresolved please follow-up with PCP or here for further evaluation.     ED Prescriptions     Medication Sig Dispense Auth. Provider   doxycycline (VIBRAMYCIN) 100 MG capsule Take 1 capsule (100 mg total) by mouth 2 (two) times daily for 7 days. 14 capsule Eliezer Lofts, FNP   predniSONE (DELTASONE) 20 MG tablet Take 3 tabs PO daily x 5 days. 15 tablet  Eliezer Lofts, FNP   benzonatate (TESSALON) 200 MG capsule Take 1 capsule (200 mg total) by mouth 3 (three) times daily as needed for up to 7 days. 40 capsule Eliezer Lofts, FNP   chlorpheniramine-HYDROcodone (TUSSIONEX) 10-8 MG/5ML Take 5 mLs by mouth every 12 (twelve) hours as needed for cough. 115 mL Eliezer Lofts, FNP      I have reviewed the PDMP during this encounter.   Eliezer Lofts, [place] 03/30/22 1024

## 2022-03-31 ENCOUNTER — Telehealth: Payer: Self-pay | Admitting: Emergency Medicine

## 2022-03-31 NOTE — Telephone Encounter (Signed)
LMTRC.  Advised if doing well to disregard the call.  Any questions or concerns feel free to contact the office. 

## 2022-04-19 ENCOUNTER — Ambulatory Visit: Payer: PPO | Admitting: Medical-Surgical

## 2022-04-26 ENCOUNTER — Ambulatory Visit (INDEPENDENT_AMBULATORY_CARE_PROVIDER_SITE_OTHER): Payer: PPO | Admitting: Medical-Surgical

## 2022-04-26 ENCOUNTER — Encounter: Payer: Self-pay | Admitting: Medical-Surgical

## 2022-04-26 VITALS — BP 123/81 | HR 84 | Resp 20 | Ht 64.0 in | Wt 205.1 lb

## 2022-04-26 DIAGNOSIS — J4520 Mild intermittent asthma, uncomplicated: Secondary | ICD-10-CM | POA: Diagnosis not present

## 2022-04-26 DIAGNOSIS — I10 Essential (primary) hypertension: Secondary | ICD-10-CM

## 2022-04-26 DIAGNOSIS — R09A2 Foreign body sensation, throat: Secondary | ICD-10-CM | POA: Diagnosis not present

## 2022-04-26 DIAGNOSIS — R490 Dysphonia: Secondary | ICD-10-CM | POA: Diagnosis not present

## 2022-04-26 DIAGNOSIS — Z9889 Other specified postprocedural states: Secondary | ICD-10-CM

## 2022-04-26 MED ORDER — MUPIROCIN 2 % EX OINT: 1.0000 | TOPICAL_OINTMENT | Freq: Two times a day (BID) | CUTANEOUS | 0 refills | Status: AC | PRN

## 2022-04-26 MED ORDER — ALBUTEROL SULFATE HFA 108 (90 BASE) MCG/ACT IN AERS: 2.0000 | INHALATION_SPRAY | Freq: Four times a day (QID) | RESPIRATORY_TRACT | 1 refills | Status: DC | PRN

## 2022-04-26 MED ORDER — PREGABALIN 75 MG PO CAPS: 75.0000 mg | ORAL_CAPSULE | Freq: Three times a day (TID) | ORAL | 3 refills | Status: DC

## 2022-04-26 NOTE — Progress Notes (Signed)
Established Patient Office Visit  Subjective   Patient ID: [name], female   DOB: 1956-05-04 Age: 66 y.o. MRN: [id number]   Chief Complaint  Patient presents with   Hypertension   Follow-up   HPI Pleasant 66 year old female presenting today for the following:  HTN: taking LIsinopril '5mg'$  daily, tolerating well without side effects. Occasionally checking BP with readings at goal. Follows a low sodium diet. Stays busy throughout the day. Denies lower extremity edema, headaches, dizziness, and chest pain.   Hoarseness: history of esophageal dilatation x 2. Overdue for follow up with GI. Notes that she has a globus sensation again and has to clear her throat frequently.   Cough: Recently has been coughing, worse at night. Has had some SOB as well as mid-scapular back pain over the past couple of weeks. Tried her Advair inhaler but this was not helpful. No longer has her Albuterol inhaler. Her back pain has resolved and the shortness of breath is more intermittent but she is worried. Has had to go to UC three times over the last three months. Taking Omeprazole twice daily as prescribed and has not had any reflux issues.    Objective:    Vitals:   04/26/22 1057  BP: 123/81  Pulse: 84  Resp: 20  Height: '5\' 4"'$  (1.626 m)  Weight: 205 lb 1.9 oz (93 kg)  SpO2: 95%  BMI (Calculated): 35.19   Physical Exam Vitals and nursing note reviewed.  Constitutional:      General: She is not in acute distress.    Appearance: Normal appearance. She is not ill-appearing.  HENT:     Head: Normocephalic and atraumatic.  Cardiovascular:     Rate and Rhythm: Normal rate and regular rhythm.     Pulses: Normal pulses.     Heart sounds: Normal heart sounds.  Pulmonary:     Effort: Pulmonary effort is normal. No respiratory distress.     Breath sounds: Normal breath sounds. No wheezing, rhonchi or rales.  Skin:    General: Skin is warm and dry.  Neurological:     Mental Status: She is alert and  oriented to person, place, and time.  Psychiatric:        Mood and Affect: Mood normal.        Behavior: Behavior normal.        Thought Content: Thought content normal.        Judgment: Judgment normal.   No results found for this or any previous visit (from the past 24 hour(s)).     The 10-year ASCVD risk score (Arnett DK, et al., 2019) is: 7.3%   Values used to calculate the score:     Age: 89 years     Sex: Female     Is Non-Hispanic African American: No     Diabetic: No     Tobacco smoker: No     Systolic Blood Pressure: 009 mmHg     Is BP treated: Yes     HDL Cholesterol: 89 mg/dL     Total Cholesterol: 201 mg/dL   Assessment & Plan:   1. Hoarseness 2. Globus sensation 3. History of esophageal dilatation Recommend contacting GI as she is already overdue for endoscopy. Discussed possible evaluation by ENT but with her history, feel that GI would be an appropriate place to start.   4. Mild intermittent asthma without complication Suspect post viral cough syndrome after being sick for the last 3 months. Recommend using Advair scheduled as prescribed  rather than as needed. Refilling Albuterol to use for rescue. Lungs clear on exam.   5. Hypertension, unspecified type BP at goal. Continue Lisinopril '5mg'$  daily. Continue to monitor BP at home with a goal of 130/80 or less. Recommend low sodium diet, regular intentional exercise, and maintaining a healthy weight.    Return in about 6 months (around 10/26/2022) for chronic disease follow up.  ___________________________________________ Clearnce Sorrel, DNP, APRN, FNP-BC [facility] [place]

## 2022-05-02 DIAGNOSIS — J45909 Unspecified asthma, uncomplicated: Secondary | ICD-10-CM | POA: Diagnosis not present

## 2022-05-02 DIAGNOSIS — G8929 Other chronic pain: Secondary | ICD-10-CM | POA: Diagnosis not present

## 2022-05-02 DIAGNOSIS — K219 Gastro-esophageal reflux disease without esophagitis: Secondary | ICD-10-CM | POA: Diagnosis not present

## 2022-05-02 DIAGNOSIS — G629 Polyneuropathy, unspecified: Secondary | ICD-10-CM | POA: Diagnosis not present

## 2022-05-02 DIAGNOSIS — E785 Hyperlipidemia, unspecified: Secondary | ICD-10-CM | POA: Diagnosis not present

## 2022-05-02 DIAGNOSIS — I10 Essential (primary) hypertension: Secondary | ICD-10-CM | POA: Diagnosis not present

## 2022-05-02 DIAGNOSIS — E669 Obesity, unspecified: Secondary | ICD-10-CM | POA: Diagnosis not present

## 2022-05-02 DIAGNOSIS — M109 Gout, unspecified: Secondary | ICD-10-CM | POA: Diagnosis not present

## 2022-05-23 ENCOUNTER — Encounter: Payer: Self-pay | Admitting: Physician Assistant

## 2022-05-29 ENCOUNTER — Ambulatory Visit
Admission: EM | Admit: 2022-05-29 | Discharge: 2022-05-29 | Disposition: A | Discharge status: Home / Self Care | Payer: PPO | Attending: Family Medicine | Admitting: Family Medicine

## 2022-05-29 DIAGNOSIS — J0100 Acute maxillary sinusitis, unspecified: Secondary | ICD-10-CM

## 2022-05-29 DIAGNOSIS — J309 Allergic rhinitis, unspecified: Secondary | ICD-10-CM

## 2022-05-29 DIAGNOSIS — R059 Cough, unspecified: Secondary | ICD-10-CM

## 2022-05-29 MED ORDER — HYDROCOD POLI-CHLORPHE POLI ER 10-8 MG/5ML PO SUER: 5.0000 mL | Freq: Two times a day (BID) | ORAL | 0 refills | Status: DC | PRN

## 2022-05-29 MED ORDER — AMOXICILLIN 875 MG PO TABS: 875.0000 mg | ORAL_TABLET | Freq: Two times a day (BID) | ORAL | 0 refills | Status: AC

## 2022-05-29 MED ORDER — BENZONATATE 200 MG PO CAPS: 200.0000 mg | ORAL_CAPSULE | Freq: Three times a day (TID) | ORAL | 0 refills | Status: AC | PRN

## 2022-05-29 MED ORDER — PREDNISONE 20 MG PO TABS: ORAL_TABLET | ORAL | 0 refills | Status: DC

## 2022-05-29 MED ORDER — FEXOFENADINE HCL 180 MG PO TABS: 180.0000 mg | ORAL_TABLET | Freq: Every day | ORAL | 0 refills | Status: DC

## 2022-05-29 NOTE — ED Triage Notes (Signed)
Pt presents with cough and congestion >1 week

## 2022-05-29 NOTE — ED Provider Notes (Signed)
[name]    CSN: [id number] Arrival date & time: 05/29/22  1229      History   Chief Complaint Chief Complaint  Patient presents with   Cough   Nasal Congestion    HPI [name] is a 67 y.o. female.   HPI pleasant 67 year old female presents with cough and congestion for over a week.  PMH significant for cancer, HTN, and asthma.  Past Medical History:  Diagnosis Date   Anxiety    Asthma 02/21/2018   Back pain    Cancer (HCC)    Depression    GERD (gastroesophageal reflux disease)    Hypertension     Patient Active Problem List   Diagnosis Date Noted   At risk for obstructive sleep apnea 03/26/2020   History of pulmonary embolus (PE) 03/26/2020   Basal cell carcinoma (BCC) of right ala nasi 02/21/2019   Basal cell carcinoma (BCC) of right upper eyelid 02/07/2019   Dysphagia 10/30/2018   History of esophageal dilatation 10/30/2018   Chronic right shoulder pain 10/15/2018   Asthma 02/21/2018   Paralyzed hemidiaphragm 02/21/2018   Elevated hemidiaphragm 02/05/2018   Atelectasis 02/05/2018   Anxiety 02/01/2018   Cervical neck pain with evidence of disc disease 02/01/2018   BMI 38.0-38.9,adult 02/01/2018   Cataract 04/01/2015   Depression 04/01/2015   Hyperlipidemia 04/01/2015   Hypertension 04/01/2015   Disorder of intervertebral disc of cervical spine 04/01/2015   Lumbar disc disease 04/01/2015   Fatty (change of) liver, not elsewhere classified 05/29/2012    Past Surgical History:  Procedure Laterality Date   ABDOMINAL HYSTERECTOMY     ARTHROSCOPY WITH ANTERIOR CRUCIATE LIGAMENT (ACL) REPAIR WITH ANTERIOR TIBILIAS GRAFT     BACK SURGERY     EXCISION MASS ABDOMINAL Left 11/12/2020   Procedure: EXCISION LEFT MASS ABDOMINAL;  Surgeon: Kieth Brightly, Arta Bruce, MD;  Location: [facility];  Service: General;  Laterality: Left;   FOOT SURGERY Left    SHOULDER ARTHROSCOPY W/ ROTATOR CUFF REPAIR     SHOULDER OPEN ROTATOR CUFF REPAIR     TUBAL  LIGATION      OB History   No obstetric history on file.      Home Medications    Prior to Admission medications   Medication Sig Start Date End Date Taking? Authorizing Provider  amoxicillin (AMOXIL) 875 MG tablet Take 1 tablet (875 mg total) by mouth 2 (two) times daily for 7 days. 05/29/22 06/05/22 Yes Eliezer Lofts, FNP  benzonatate (TESSALON) 200 MG capsule Take 1 capsule (200 mg total) by mouth 3 (three) times daily as needed for up to 7 days. 05/29/22 06/05/22 Yes Eliezer Lofts, FNP  chlorpheniramine-HYDROcodone (TUSSIONEX) 10-8 MG/5ML Take 5 mLs by mouth every 12 (twelve) hours as needed for cough. 05/29/22  Yes Eliezer Lofts, FNP  fexofenadine [facility] ALLERGY) 180 MG tablet Take 1 tablet (180 mg total) by mouth daily for 15 days. 05/29/22 06/13/22 Yes Eliezer Lofts, FNP  predniSONE (DELTASONE) 20 MG tablet Take 3 tabs PO daily x 5 days. 05/29/22  Yes Eliezer Lofts, FNP  albuterol (VENTOLIN HFA) 108 (90 Base) MCG/ACT inhaler Inhale 2 puffs into the lungs every 6 (six) hours as needed for wheezing or shortness of breath. 04/26/22   Samuel Bouche, NP  CASCARA SAGRADA PO Take 1 capsule by mouth at bedtime.    [provider]  Cholecalciferol (VITAMIN D) 125 MCG (5000 UT) CAPS Take 5,000 Units by mouth daily.    [provider]  DULoxetine (CYMBALTA) 30  MG capsule Take 1 capsule (30 mg total) by mouth at bedtime. 01/27/22   Samuel Bouche, NP  DULoxetine (CYMBALTA) 60 MG capsule TAKE 1 CAPSULE BY MOUTH EVERY DAY 02/01/22   Samuel Bouche, NP  lisinopril (ZESTRIL) 5 MG tablet Take 1 tablet (5 mg total) by mouth daily. 12/30/21   Samuel Bouche, NP  meloxicam (MOBIC) 15 MG tablet Take 1 tablet (15 mg total) by mouth daily. 03/27/22   Samuel Bouche, NP  mupirocin ointment (BACTROBAN) 2 % Apply 1 Application topically 2 (two) times daily as needed. 04/26/22   Samuel Bouche, NP  omeprazole (PRILOSEC) 40 MG capsule TAKE 1 CAPSULE BY MOUTH EVERY DAY IN THE MORNING AND AT BEDTIME 12/30/21   Samuel Bouche, NP   pregabalin (LYRICA) 75 MG capsule Take 1 capsule (75 mg total) by mouth 3 (three) times daily. 04/26/22   Samuel Bouche, NP  vitamin E 1000 UNIT capsule Take 1,000 Units by mouth daily.    [provider]    Family History Family History  Problem Relation Age of Onset   Cancer Mother        lung   Cancer Father        lung   Hypertension Father    Diabetes Father    Breast cancer Sister 74   Breast cancer Maternal Aunt    Colon cancer Maternal Aunt    Esophageal cancer Maternal Uncle    Breast cancer Cousin    Non-Hodgkin's lymphoma Son    Rectal cancer Neg Hx    Stomach cancer Neg Hx     Social History Social History   Tobacco Use   Smoking status: Never    Passive exposure: Yes   Smokeless tobacco: Never  Vaping Use   Vaping Use: Never used  Substance Use Topics   Alcohol use: Yes    Comment: occasionally   Drug use: Never     Allergies   Patient has no known allergies.   Review of Systems Review of Systems  HENT:  Positive for congestion.   Respiratory:  Positive for cough.   All other systems reviewed and are negative.    Physical Exam Triage Vital Signs ED Triage Vitals  Enc Vitals Group     BP 05/29/22 1237 123/78     Pulse Rate 05/29/22 1237 88     Resp 05/29/22 1237 12     Temp 05/29/22 1237 (!) 97.4 F (36.3 C)     Temp Source 05/29/22 1237 Oral     SpO2 05/29/22 1237 98 %     Weight --      Height --      Head Circumference --      Peak Flow --      Pain Score 05/29/22 1236 0     Pain Loc --      Pain Edu? --      Excl. in [place]? --    No data found.  Updated Vital Signs BP 123/78 (BP Location: Left Arm)   Pulse 88   Temp (!) 97.4 F (36.3 C) (Oral)   Resp 12   SpO2 98%   Visual Acuity Right Eye Distance:   Left Eye Distance:   Bilateral Distance:    Right Eye Near:   Left Eye Near:    Bilateral Near:     Physical Exam Vitals and nursing note reviewed.  Constitutional:      Appearance: Normal appearance. She  is obese. She is ill-appearing.  HENT:  Head: Normocephalic and atraumatic.     Right Ear: Tympanic membrane and external ear normal.     Left Ear: Tympanic membrane and external ear normal.     Ears:     Comments: Significant eustachian tube dysfunction noted bilaterally    Nose:     Comments: Turbinates are erythematous/edematous    Mouth/Throat:     Mouth: Mucous membranes are moist.     Pharynx: Oropharynx is clear.     Comments: Significant amount of clear drainage of posterior oropharynx noted Eyes:     Extraocular Movements: Extraocular movements intact.     Conjunctiva/sclera: Conjunctivae normal.     Pupils: Pupils are equal, round, and reactive to light.  Cardiovascular:     Rate and Rhythm: Normal rate and regular rhythm.     Pulses: Normal pulses.     Heart sounds: Normal heart sounds. No murmur heard. Pulmonary:     Effort: Pulmonary effort is normal.     Breath sounds: Normal breath sounds. No wheezing, rhonchi or rales.     Comments: Frequent nonproductive cough noted on exam Musculoskeletal:        General: Normal range of motion.     Cervical back: Normal range of motion and neck supple.  Skin:    General: Skin is warm and dry.  Neurological:     General: No focal deficit present.     Mental Status: She is alert and oriented to person, place, and time. Mental status is at baseline.      UC Treatments / Results  Labs (all labs ordered are listed, but only abnormal results are displayed) Labs Reviewed - No data to display  EKG   Radiology No results found.  Procedures Procedures (including critical care time)  Medications Ordered in UC Medications - No data to display  Initial Impression / Assessment and Plan / UC Course  I have reviewed the triage vital signs and the nursing notes.  Pertinent labs & imaging results that were available during my care of the patient were reviewed by me and considered in my medical decision making (see chart  for details).     MDM: 1.  Acute maxillary sinusitis recurrence not specified-Rx'd amoxicillin; 2.  Cough, unspecified type cough-Rx'd prednisone, Tessalon, Tussionex; 3.  Allergic rhinitis-Rx Allegra. Advised patient to take prednisone and Allegra with first dose of Augmentin for the next 5 to 7 days.  Advised may use Allegra as needed afterwards for concurrent postnasal drainage/drip.  Advised may use Tessalon Perles for daytime cough and Tussionex syrup for nighttime cough.  Encouraged patient to increase daily water intake while taking these medications.  Advised if symptoms worsen and/or unresolved please follow-up with PCP or here for further evaluation.  Patient discharged home, hemodynamically stable. Final Clinical Impressions(s) / UC Diagnoses   Final diagnoses:  Cough, unspecified type  Acute maxillary sinusitis, recurrence not specified  Allergic rhinitis, unspecified seasonality, unspecified trigger     Discharge Instructions      Advised patient to take medications as directed with food to completion.  Advised patient to take prednisone and Allegra with first dose of Augmentin for the next 5 to 7 days.  Advised may use Allegra as needed afterwards for concurrent postnasal drainage/drip.  Advised may use Tessalon Perles for daytime cough and Tussionex syrup for nighttime cough.  Encouraged patient to increase daily water intake while taking these medications.  Advised if symptoms worsen and/or unresolved please follow-up with PCP or here for further evaluation.  ED Prescriptions     Medication Sig Dispense Auth. Provider   amoxicillin (AMOXIL) 875 MG tablet Take 1 tablet (875 mg total) by mouth 2 (two) times daily for 7 days. 14 tablet Eliezer Lofts, FNP   predniSONE (DELTASONE) 20 MG tablet Take 3 tabs PO daily x 5 days. 15 tablet Eliezer Lofts, FNP   fexofenadine [facility] ALLERGY) 180 MG tablet Take 1 tablet (180 mg total) by mouth daily for 15 days. 15 tablet Eliezer Lofts, FNP   benzonatate (TESSALON) 200 MG capsule Take 1 capsule (200 mg total) by mouth 3 (three) times daily as needed for up to 7 days. 40 capsule Eliezer Lofts, FNP   chlorpheniramine-HYDROcodone (TUSSIONEX) 10-8 MG/5ML Take 5 mLs by mouth every 12 (twelve) hours as needed for cough. 115 mL Eliezer Lofts, FNP      I have reviewed the PDMP during this encounter.   Eliezer Lofts, [place] 05/29/22 1254

## 2022-05-29 NOTE — Discharge Instructions (Addendum)
Advised patient to take medications as directed with food to completion.  Advised patient to take prednisone and Allegra with first dose of Augmentin for the next 5 to 7 days.  Advised may use Allegra as needed afterwards for concurrent postnasal drainage/drip.  Advised may use Tessalon Perles for daytime cough and Tussionex syrup for nighttime cough.  Encouraged patient to increase daily water intake while taking these medications.  Advised if symptoms worsen and/or unresolved please follow-up with PCP or here for further evaluation.

## 2022-05-31 ENCOUNTER — Encounter: Payer: Self-pay | Admitting: Medical-Surgical

## 2022-05-31 DIAGNOSIS — Z9889 Other specified postprocedural states: Secondary | ICD-10-CM

## 2022-05-31 DIAGNOSIS — Z1211 Encounter for screening for malignant neoplasm of colon: Secondary | ICD-10-CM

## 2022-06-04 ENCOUNTER — Encounter: Payer: Self-pay | Admitting: Medical-Surgical

## 2022-06-05 ENCOUNTER — Other Ambulatory Visit: Payer: Self-pay | Admitting: Medical-Surgical

## 2022-06-05 ENCOUNTER — Encounter: Payer: Self-pay | Admitting: Medical-Surgical

## 2022-06-05 DIAGNOSIS — F419 Anxiety disorder, unspecified: Secondary | ICD-10-CM

## 2022-06-05 DIAGNOSIS — Z780 Asymptomatic menopausal state: Secondary | ICD-10-CM

## 2022-06-05 MED ORDER — DULOXETINE HCL 60 MG PO CPEP: 60.0000 mg | ORAL_CAPSULE | Freq: Every day | ORAL | 3 refills | Status: DC

## 2022-06-05 NOTE — Telephone Encounter (Signed)
[facility] requesting med refill for duloxetine. Per active med list - 30 mg and 60 mg in chart. Is patient on both doses? Please advise, thanks.

## 2022-06-07 NOTE — Progress Notes (Signed)
06/08/2022 [name] [id number] 1955-12-23  Referring provider: Samuel Bouche, NP Primary GI doctor: Dr. Tarri Edwards  ASSESSMENT AND PLAN:   Esophageal dysphagia with GERD and previous dilitation 2020 with help On mobic x 1 year, PRN prednisone for cough EGD with dilatation to evaluate for stenosis, tumor, erosive/infectious esophagititis, PUD, gastritis with history If the EGD is negative and symptoms continue, can consider barium swallow, Get off ACE with PCP, possible ENT if negative, possible cervical neck component Will increase PPI to twice daily, emphasizing before food., Will add on pepcid at night., Will get RUQ [place] and consider HIDA. , and Weight loss discussed with patient in detail. I discussed risks of EGD with patient today, including risk of sedation, bleeding or perforation.  Patient provides understanding and gave verbal consent to proceed.  History of colonic polyps We have discussed the risks of bleeding, infection, perforation, medication reactions, and remote risk of death associated with colonoscopy. All questions were answered and the patient acknowledges these risk and wishes to proceed.  RUQ pain on exam Previous work up 2013 negative Get labs, repeat RUQ [place] Consider repeat HIDA if EGD negative   Patient Care Team: Karina Bouche, NP as PCP - General (Nurse Practitioner) [name], [facility] as Pharmacist (Pharmacist)  HISTORY OF PRESENT ILLNESS: 66 y.o. female with a past medical history of PE 2021, negative coag panel, on basa only, followed Dr. Marin Edwards, [facility], GERD s/p diltation, and others listed below presents for evaluation of EGD/colon.   11/13/18 EGD Reflux esophagitis presenting with dysphagia and globus- biopsies confirmed reflux and were negative, Empirically dilated with 25F TTS balloon with improvement  Colonoscopy with [name] in [name] 2013- no polyps of colonoscopy 1997 or 2007 [name]) No known family history of colon cancer or  polyps Family history of pancreatic cancer (two maternal aunts in their 62s)  Husband [name] is with her and provides some of the history.  States last 4 months she has been sick every month, states having worsening cough/bronchitis, has hoarseness constant.  She has had worsening dysphagia, has breads and meats she needs to be careful with and cut small. No issues with liquids. Drinking water helps clear.  Denies GERD, she is on omeprazole 40 mg BID.  Lisinopril for 6 months.  No nausea, vomiting, melena, AB pain.  She takes [place], has BM daily.   She is on mobic 15 mg daily for a year, has , 1-2 vodka drinks likely one shot per drink. No smoking.  Has 5 grandkids and keeps them during the week.   Prior abdominal imaging includes: CT abdomen and pelvis with contrast 8/1/7 for left lower quadrant abdominal pain showed a small left ovarian cyst but was otherwise normal Abdominal ultrasound 03/22/2012 for abdominal pain: Fatty liver otherwise normal HIDA scan with CCK 03/28/2012: Normal.  Gallbladder EF 93%.   Prior endoscopic evaluation includes: - Colonoscopy 06/04/1995 for left lower quadrant pain was normal.  She was diagnosed with IBS. - Colonoscopy with Dr. Sharlett Edwards for constipation, abdominal pain and bloating 8/24/7 showed melanosis coli.  He gave her diagnosis of chronic functional constipation.  She was prescribed Amitiza 24 mcg twice daily. - EGD for dysphasia 8/24/7 with Dr. Sharlett Edwards showed a prolapsing 2 cm hiatal hernia, no esophageal stricture, gastritis and duodenal bulb erosions.  Duodenal biopsies were normal.  She was dilated with a 56 [place] Maloney dilator.  She was prescribed lansoprazole 30 mg every morning.  CBC  03/08/2022  HGB 14.8  MCV 101.8 without evidence of anemia WBC 6.9 Platelets 253 Anemia panel 04/20/2020  Iron 88 Ferritin 439  Kidney function 03/08/2022  BUN 16 Cr 1.10  GFR 55  Potassium 4.9   LFTs 03/08/2022  AST 26 ALT 19 Alkphos 72  TBili 0.6   She  reports that she has never smoked. She has been exposed to tobacco smoke. She has never used smokeless tobacco. She reports current alcohol use. She reports that she does not use drugs.  Current Medications:   Current Outpatient Medications (Endocrine & Metabolic):    predniSONE (DELTASONE) 20 MG tablet, Take 3 tabs PO daily x 5 days. (Patient not taking: Reported on 06/08/2022)  Current Outpatient Medications (Cardiovascular):    lisinopril (ZESTRIL) 5 MG tablet, Take 1 tablet (5 mg total) by mouth daily.  Current Outpatient Medications (Respiratory):    albuterol (VENTOLIN HFA) 108 (90 Base) MCG/ACT inhaler, Inhale 2 puffs into the lungs every 6 (six) hours as needed for wheezing or shortness of breath.   chlorpheniramine-HYDROcodone (TUSSIONEX) 10-8 MG/5ML, Take 5 mLs by mouth every 12 (twelve) hours as needed for cough.   fexofenadine (ALLEGRA ALLERGY) 180 MG tablet, Take 1 tablet (180 mg total) by mouth daily for 15 days.  Current Outpatient Medications (Analgesics):    meloxicam (MOBIC) 15 MG tablet, Take 1 tablet (15 mg total) by mouth daily. (Patient not taking: Reported on 06/08/2022)   Current Outpatient Medications (Other):    CASCARA SAGRADA PO, Take 1 capsule by mouth at bedtime.   Cholecalciferol (VITAMIN D) 125 MCG (5000 UT) CAPS, Take 5,000 Units by mouth daily.   DULoxetine (CYMBALTA) 30 MG capsule, TAKE 1 CAPSULE (30 MG TOTAL) BY MOUTH AT BEDTIME.   DULoxetine (CYMBALTA) 60 MG capsule, Take 1 capsule (60 mg total) by mouth daily.   famotidine (PEPCID) 40 MG tablet, Take 1 tablet (40 mg total) by mouth at bedtime.   mupirocin ointment (BACTROBAN) 2 %, Apply 1 Application topically 2 (two) times daily as needed.   omeprazole (PRILOSEC) 40 MG capsule, TAKE 1 CAPSULE BY MOUTH EVERY DAY IN THE MORNING AND AT BEDTIME   pregabalin (LYRICA) 75 MG capsule, Take 1 capsule (75 mg total) by mouth 3 (three) times daily.   vitamin E 1000 UNIT capsule, Take 1,000  Units by mouth daily.  Medical History:  Past Medical History:  Diagnosis Date   Anxiety    Asthma 02/21/2018   Back pain    Cancer (HCC)    Depression    GERD (gastroesophageal reflux disease)    Hypertension    Allergies: No Known Allergies   Surgical History:  She  has a past surgical history that includes Abdominal hysterectomy; Back surgery; Shoulder arthroscopy w/ rotator cuff repair; Tubal ligation; Shoulder open rotator cuff repair; Arthroscopy with anterior cruciate ligament (ACL) repair with anterior tibilias graft; Excision mass abdominal (Left, 11/12/2020); and Foot surgery (Left). Family History:  Her family history includes Breast cancer in her cousin and maternal aunt; Breast cancer (age of onset: 55) in her sister; Cancer in her father and mother; Colon cancer in her maternal aunt; Diabetes in her father; Esophageal cancer in her maternal uncle; Hypertension in her father; Non-Hodgkin's lymphoma in her son.  REVIEW OF SYSTEMS  : All other systems reviewed and negative except where noted in the History of Present Illness.  PHYSICAL EXAM: BP 110/62   Pulse 61   Ht '5\' 3"'$  (1.6 m)   Wt 208 lb (94.3 kg)   BMI 36.85 kg/m  General:  Pleasant, well developed female in no acute distress Head:   Normocephalic and atraumatic. Eyes:  sclerae anicteric,conjunctive pink  Heart:   regular rate and rhythm, no murmurs or gallops Pulm:  Clear anteriorly; no wheezing Abdomen:   Soft, Obese AB, Active bowel sounds. mild tenderness in the RUQ. Without guarding and Without rebound, No organomegaly appreciated. Rectal: Not evaluated Extremities:  Without edema. Msk: Symmetrical without gross deformities. Peripheral pulses intact.  Neurologic:  Alert and  oriented x4;  No focal deficits.  Skin:   Dry and intact without significant lesions or rashes. Psychiatric:  Cooperative. Normal mood and affect.  RELEVANT LABS AND IMAGING: CBC    Component Value Date/Time   WBC 6.9  03/08/2022 0748   WBC 5.3 06/14/2021 0000   RBC 4.50 03/08/2022 0748   HGB 14.8 03/08/2022 0748   HCT 45.8 03/08/2022 0748   PLT 253 03/08/2022 0748   MCV 101.8 (H) 03/08/2022 0748   MCH 32.9 03/08/2022 0748   MCHC 32.3 03/08/2022 0748   RDW 14.4 03/08/2022 0748   LYMPHSABS 2.9 03/08/2022 0748   MONOABS 0.6 03/08/2022 0748   EOSABS 0.2 03/08/2022 0748   BASOSABS 0.1 03/08/2022 0748    CMP     Component Value Date/Time   NA 144 03/08/2022 0748   K 4.9 03/08/2022 0748   CL 105 03/08/2022 0748   CO2 31 03/08/2022 0748   GLUCOSE 98 03/08/2022 0748   BUN 16 03/08/2022 0748   CREATININE 1.10 (H) 03/08/2022 0748   CREATININE 0.78 02/09/2020 1415   CALCIUM 9.6 03/08/2022 0748   PROT 6.6 03/08/2022 0748   ALBUMIN 3.9 03/08/2022 0748   AST 26 03/08/2022 0748   ALT 19 03/08/2022 0748   ALKPHOS 72 03/08/2022 0748   BILITOT 0.6 03/08/2022 0748   GFRNONAA 55 (L) 03/08/2022 0748   GFRNONAA 50 (L) 02/01/2018 1205   GFRAA 58 (L) 02/01/2018 1205     Vladimir Crofts, PA-C 8:47 AM

## 2022-06-08 ENCOUNTER — Ambulatory Visit: Payer: PPO | Admitting: Physician Assistant

## 2022-06-08 ENCOUNTER — Encounter: Payer: Self-pay | Admitting: Physician Assistant

## 2022-06-08 ENCOUNTER — Other Ambulatory Visit (INDEPENDENT_AMBULATORY_CARE_PROVIDER_SITE_OTHER): Payer: PPO

## 2022-06-08 VITALS — BP 110/62 | HR 61 | Ht 63.0 in | Wt 208.0 lb

## 2022-06-08 DIAGNOSIS — K219 Gastro-esophageal reflux disease without esophagitis: Secondary | ICD-10-CM

## 2022-06-08 DIAGNOSIS — Z86010 Personal history of colonic polyps: Secondary | ICD-10-CM

## 2022-06-08 DIAGNOSIS — R1011 Right upper quadrant pain: Secondary | ICD-10-CM | POA: Diagnosis not present

## 2022-06-08 DIAGNOSIS — R1319 Other dysphagia: Secondary | ICD-10-CM

## 2022-06-08 LAB — CBC WITH DIFFERENTIAL/PLATELET
Basophils Absolute: 0.1 10*3/uL (ref 0.0–0.1)
Basophils Relative: 1 % (ref 0.0–3.0)
Eosinophils Absolute: 0.2 10*3/uL (ref 0.0–0.7)
Eosinophils Relative: 3.1 % (ref 0.0–5.0)
HCT: 44.2 % (ref 36.0–46.0)
Hemoglobin: 14.8 g/dL (ref 12.0–15.0)
Lymphocytes Relative: 39.2 % (ref 12.0–46.0)
Lymphs Abs: 2.2 10*3/uL (ref 0.7–4.0)
MCHC: 33.5 g/dL (ref 30.0–36.0)
MCV: 99.1 fl (ref 78.0–100.0)
Monocytes Absolute: 0.5 10*3/uL (ref 0.1–1.0)
Monocytes Relative: 8.4 % (ref 3.0–12.0)
Neutro Abs: 2.7 10*3/uL (ref 1.4–7.7)
Neutrophils Relative %: 48.3 % (ref 43.0–77.0)
Platelets: 245 10*3/uL (ref 150.0–400.0)
RBC: 4.46 Mil/uL (ref 3.87–5.11)
RDW: 13.7 % (ref 11.5–15.5)
WBC: 5.6 10*3/uL (ref 4.0–10.5)

## 2022-06-08 LAB — COMPREHENSIVE METABOLIC PANEL WITH GFR
ALT: 21 U/L (ref 0–35)
AST: 21 U/L (ref 0–37)
Albumin: 3.7 g/dL (ref 3.5–5.2)
Alkaline Phosphatase: 68 U/L (ref 39–117)
BUN: 13 mg/dL (ref 6–23)
CO2: 28 meq/L (ref 19–32)
Calcium: 8.6 mg/dL (ref 8.4–10.5)
Chloride: 104 meq/L (ref 96–112)
Creatinine, Ser: 0.97 mg/dL (ref 0.40–1.20)
GFR: 60.61 mL/min
Glucose, Bld: 85 mg/dL (ref 70–99)
Potassium: 3.9 meq/L (ref 3.5–5.1)
Sodium: 139 meq/L (ref 135–145)
Total Bilirubin: 0.6 mg/dL (ref 0.2–1.2)
Total Protein: 6.2 g/dL (ref 6.0–8.3)

## 2022-06-08 MED ORDER — NA SULFATE-K SULFATE-MG SULF 17.5-3.13-1.6 GM/177ML PO SOLN: ORAL | 0 refills | Status: DC

## 2022-06-08 MED ORDER — FAMOTIDINE 40 MG PO TABS: 40.0000 mg | ORAL_TABLET | Freq: Every day | ORAL | 0 refills | Status: DC

## 2022-06-08 NOTE — Patient Instructions (Addendum)
Your provider has requested that you go to the basement level for lab work before leaving today. Press "B" on the elevator. The lab is located at the first door on the left as you exit the elevator.  You will be contacted by [facility] in the next 2 days to arrange a Abdominal Ultrasound.  The number on your caller ID will be [phone], please answer when they call.  If you have not heard from them in 2 days please call [phone] to schedule.     You have been scheduled for an endoscopy and colonoscopy. Please follow the written instructions given to you at your visit today. Please pick up your prep supplies at the pharmacy within the next 1-3 days. If you use inhalers (even only as needed), please bring them with you on the day of your procedure.   Please take your proton pump inhibitor medication, omeprazole twice a day add on pepcid at night Please take this medication 30 minutes to 1 hour before meals- this makes it more effective.  Avoid spicy and acidic foods Avoid fatty foods Limit your intake of coffee, tea, alcohol, and carbonated drinks Work to maintain a healthy weight Keep the head of the bed elevated at least 3 inches with blocks or a wedge pillow if you are having any nighttime symptoms Stay upright for 2 hours after eating Avoid meals and snacks three to four hours before bedtime  Ask PCP about stopping the lisinopril- can cause cause  Due to recent changes in healthcare laws, you may see the results of your imaging and laboratory studies on MyChart before your provider has had a chance to review them.  We understand that in some cases there may be results that are confusing or concerning to you. Not all laboratory results come back in the same time frame and the provider may be waiting for multiple results in order to interpret others.  Please give [place] 48 hours in order for your provider to thoroughly review all the results before contacting the office for  clarification of your results.    _______________________________________________________  If your blood pressure at your visit was 140/90 or greater, please contact your primary care physician to follow up on this.  _______________________________________________________  If you are age 51 or older, your body mass index should be between 23-30. Your Body mass index is 36.85 kg/m. If this is out of the aforementioned range listed, please consider follow up with your Primary Care Provider.  If you are age 62 or younger, your body mass index should be between 19-25. Your Body mass index is 36.85 kg/m. If this is out of the aformentioned range listed, please consider follow up with your Primary Care Provider.   ________________________________________________________  The [facility] providers would like to encourage you to use [facility] to communicate with providers for non-urgent requests or questions.  Due to long hold times on the telephone, sending your provider a message by [facility] may be a faster and more efficient way to get a response.  Please allow 48 business hours for a response.  Please remember that this is for non-urgent requests.  _______________________________________________________   Thank you for choosing [facility]  [facility]

## 2022-06-08 NOTE — Progress Notes (Signed)
Reviewed and agree with management plans. ? ?Tayden Nichelson L. Shrihaan Porzio, MD, MPH  ?

## 2022-06-20 ENCOUNTER — Ambulatory Visit (HOSPITAL_COMMUNITY)
Admission: RE | Admit: 2022-06-20 | Discharge: 2022-06-20 | Disposition: A | Discharge status: Home / Self Care | Payer: PPO | Source: Ambulatory Visit | Attending: Physician Assistant | Admitting: Physician Assistant

## 2022-06-20 ENCOUNTER — Encounter (HOSPITAL_COMMUNITY): Payer: Self-pay

## 2022-06-20 DIAGNOSIS — R1011 Right upper quadrant pain: Secondary | ICD-10-CM

## 2022-06-26 ENCOUNTER — Telehealth: Payer: PPO

## 2022-06-26 ENCOUNTER — Telehealth: Payer: Self-pay

## 2022-06-26 NOTE — Progress Notes (Signed)
  Care Coordination Note  06/26/2022 Name: [name] MRN: [id number] DOB: 03-18-1956  [name] is a 67 y.o. year old female who is a primary care patient of Samuel Bouche, NP and is actively engaged with the Chronic Care Management team. I reached out to [name] by phone today to assist with re-scheduling a follow up visit with the Pharmacist  Follow up plan: Unsuccessful telephone outreach attempt made. A HIPAA compliant phone message was left for the patient providing contact information and requesting a return call.  The care management team will reach out to the patient again over the next 7 days.  If patient returns call to provider office, please advise to call [facility]  [facility] [place], [place], [place] [address] Direct Dial: [phone] [email or web address]

## 2022-06-27 ENCOUNTER — Ambulatory Visit (HOSPITAL_COMMUNITY)
Admission: RE | Admit: 2022-06-27 | Discharge: 2022-06-27 | Disposition: A | Discharge status: Home / Self Care | Payer: PPO | Source: Ambulatory Visit | Attending: Physician Assistant | Admitting: Physician Assistant

## 2022-06-27 DIAGNOSIS — R1011 Right upper quadrant pain: Secondary | ICD-10-CM | POA: Diagnosis not present

## 2022-06-30 ENCOUNTER — Other Ambulatory Visit: Payer: PPO | Admitting: Pharmacist

## 2022-06-30 NOTE — Patient Instructions (Signed)
Leafy Ro,  It was great chatting with you today! As we discussed, you can continue without the lisinopril moving forward.   However, continue to check blood pressure occasionally and if the top number consistently runs >140, please reach out to your primary doctor (as we may need to restart the medicine).  Below are some helpful instructions about checking blood pressure:  Check your blood pressure periodically, and any time you have concerning symptoms like headache, chest pain, dizziness, shortness of breath, or vision changes.   Our goal is less than 140/90.  To appropriately check your blood pressure, make sure you do the following:  1) Avoid caffeine, exercise, or tobacco products for 30 minutes before checking. Empty your bladder. 2) Sit with your back supported in a flat-backed chair. Rest your arm on something flat (arm of the chair, table, etc). 3) Sit still with your feet flat on the floor, resting, for at least 5 minutes.  4) Check your blood pressure. Take 1-2 readings.  5) Write down these readings and bring with you to any provider appointments.  Bring your home blood pressure machine with you to a provider's office for accuracy comparison at least once a year.   Otherwise, keep your other medications the same and best wishes with your upcoming testing!  Take care, Luana Shu, PharmD Clinical Pharmacist [facility] [phone]

## 2022-06-30 NOTE — Progress Notes (Signed)
06/30/2022 Name: CHANCI COPE MRN: [id number] DOB: Oct 07, 1955   [name] is a 67 y.o. year old female who presented for a telephone visit.   Of note, patient was instructed to stop lisinopril at [facility] appt, due to controlled blood pressure and possibly seeing if it was contributing to patient's cough. She has been checking her blood pressure at home, and runs 120-130s/75-85s off of the lisinopril. Her cough has not improved.  Subjective:  Care Team: Primary Care Provider: Samuel Bouche, NP ; Next Scheduled Visit: 10/26/22  Medication Access/Adherence  Current Pharmacy:  [id number] [place]: [phone]: [phone]- [place]- [address]Phone: [phone]: [phone]  Patient reports affordability concerns with their medications: No  Patient reports access/transportation concerns to their pharmacy: No  Patient reports adherence concerns with their medications:  No     Hypertension:  Current medications: lisinopril 589mdaily, though has held this medication to see if contributing to cough.  Patient has a validated, automated, upper arm home BP cuff Current blood pressure readings readings: 120-130s/75-85s  Patient denies hypotensive s/sx including dizziness, lightheadedness.  Patient denies hypertensive symptoms including headache, chest pain, shortness of breath   Objective:  No results found for: "HGBA1C"  Lab Results  Component Value Date   CREATININE 0.97 06/08/2022   BUN 13 06/08/2022   NA 139 06/08/2022   K 3.9 06/08/2022   CL 104 06/08/2022   CO2 28 06/08/2022    Lab Results  Component Value Date   CHOL 201 (H) 06/14/2021   HDL 89 06/14/2021   LDLCALC 98 06/14/2021   TRIG 59 06/14/2021   CHOLHDL 2.3 06/14/2021    Medications Reviewed Today     Reviewed  by KlDarius BumpRPIsle of HopePharmacist) on 06/30/22 at 09Winstonist Status: <None>   Medication Order Taking? Sig Documenting Provider Last Dose Status Informant  albuterol (VENTOLIN HFA) 108 (90 Base) MCG/ACT inhaler [id number] Inhale 2 puffs into the lungs every 6 (six) hours as needed for wheezing or shortness of breath. JeSamuel BoucheNP Taking Active   Cholecalciferol (VITAMIN D) 125 MCG (5000 UT) CAPS [id number] Take 5,000 Units by mouth daily. [provider] Taking Active Self  DULoxetine (CYMBALTA) 30 MG capsule [id number] TAKE 1 CAPSULE (30 MG TOTAL) BY MOUTH AT BEDTIME. JeSamuel BoucheNP Taking Active   DULoxetine (CYMBALTA) 60 MG capsule [id number] Take 1 capsule (60 mg total) by mouth daily. JeSamuel BoucheNP Taking Active   famotidine (PEPCID) 40 MG tablet [id number] Take 1 tablet (40 mg total) by mouth at bedtime. CoVladimir CroftsPA-C Taking Active   fexofenadine ([facility]ERGY) 180 MG tablet [id number] 1 tablet (180 mg total) by mouth daily for 15 days. RaEliezer LoftsFNP  Expired 06/13/22 2359   lisinopril (ZESTRIL) 5 MG tablet [id number] Take 1 tablet (5 mg total) by mouth daily.  Patient not taking: Reported on 06/30/2022   JeSamuel BoucheNP Not Taking Active   meloxicam (MOBIC) 15 MG tablet [id number] Take 1 tablet (15 mg total) by mouth daily. JeSamuel BoucheNP Taking Active   mupirocin ointment (BACTROBAN) 2 % [id number] Apply 1 Application topically 2 (two) times daily as needed. JeSamuel BoucheNP Taking Active   Na Sulfate-K Sulfate-Mg Sulf 17.5-3.13-1.6 GM/177ML SOLN  [id number] Yes 1 colonoscopy prep as directed by GI office instructions Hareem, Fatheree, PA-C Taking Active   omeprazole (PRILOSEC) 40 MG capsule [id number] Yes TAKE 1 CAPSULE BY MOUTH EVERY DAY IN THE MORNING AND AT BEDTIME Samuel Bouche, NP Taking Active   pregabalin (LYRICA) 75 MG capsule [id number] Yes Take 1 capsule (75 mg total) by mouth 3 (three) times daily. Samuel Bouche, NP Taking  Active   vitamin E 1000 UNIT capsule [id number] Yes Take 1,000 Units by mouth daily. [provider] Taking Active Self              Assessment/Plan:   Hypertension: - Currently controlled, trial without lisinopril likely is appropriate for now - Reviewed appropriate blood pressure monitoring technique and reviewed goal blood pressure. Recommended to check home blood pressure and heart rate occasionally, and notify PCP if 0000000 systolic consistently. - Recommend to continue current regimen   Follow Up Plan: as needed, provided guidance for how to reconnect with pharmacist in future if desired.  Larinda Buttery, PharmD Clinical Pharmacist [facility] [phone]

## 2022-07-10 ENCOUNTER — Ambulatory Visit (INDEPENDENT_AMBULATORY_CARE_PROVIDER_SITE_OTHER): Payer: PPO

## 2022-07-10 ENCOUNTER — Encounter: Payer: Self-pay | Admitting: Emergency Medicine

## 2022-07-10 ENCOUNTER — Ambulatory Visit
Admission: EM | Admit: 2022-07-10 | Discharge: 2022-07-10 | Disposition: A | Discharge status: Home / Self Care | Payer: PPO | Attending: Emergency Medicine | Admitting: Emergency Medicine

## 2022-07-10 DIAGNOSIS — R0989 Other specified symptoms and signs involving the circulatory and respiratory systems: Secondary | ICD-10-CM | POA: Diagnosis not present

## 2022-07-10 DIAGNOSIS — R0781 Pleurodynia: Secondary | ICD-10-CM | POA: Diagnosis not present

## 2022-07-10 DIAGNOSIS — S2231XA Fracture of one rib, right side, initial encounter for closed fracture: Secondary | ICD-10-CM

## 2022-07-10 DIAGNOSIS — J9811 Atelectasis: Secondary | ICD-10-CM | POA: Diagnosis not present

## 2022-07-10 DIAGNOSIS — R069 Unspecified abnormalities of breathing: Secondary | ICD-10-CM | POA: Diagnosis not present

## 2022-07-10 DIAGNOSIS — R079 Chest pain, unspecified: Secondary | ICD-10-CM | POA: Diagnosis not present

## 2022-07-10 MED ORDER — IBUPROFEN 800 MG PO TABS
800.0000 mg | ORAL_TABLET | Freq: Once | ORAL | Status: AC
  Administered 2022-07-10: 800 mg via ORAL

## 2022-07-10 NOTE — ED Triage Notes (Signed)
Patient states that she fell about 2 weeks ago.  Now patient is having right sided rib and back pain.  Unable to lay flat and difficulty taken deep breaths.  Patient has taken Tylenol for pain.

## 2022-07-10 NOTE — Discharge Instructions (Signed)
Your x-ray revealed a possible fracture of your right eighth rib which is consistent with the location and quality of your pain.  I recommend that you continue taking ibuprofen 1000 mg every 8 hours along with ibuprofen 600 to 800 mg every 8 hours for the next several days to help keep your pain well-controlled.  You are welcome to apply ice to the affected area for 20 to 30 minutes 3-4 times daily to help reduce inflammation.  I also recommend that you carry a small throat pillow under your right arm so that when you need to cough you can use the pillow as a splint.  As you can imagine, it is not practical to wrap you up in a cast until your bone heals.  Holding a pillow against your ribs when you need to cough syrups the same purpose.  I recommend that you deep breathe is much as you can, rest is much as you can doing any activities that cause you pain.  If you are not feeling better in the next 7 to 10 days, please follow-up with your primary care provider or with an orthopedic specialist.  Enck you for visiting urgent care today.

## 2022-07-10 NOTE — ED Provider Notes (Signed)
[name]    CSN: [id number] Arrival date & time: 07/10/22  1115    HISTORY   Chief Complaint  Patient presents with   Fall   HPI [name] is a pleasant, 67 y.o. female who presents to urgent care today. Patient reports witnessed fall at home 2 weeks ago, denies loss of consciousness, states her right arm was folded across the front of her abdomen and she fell forward landing on it, pinning her arm between the floor and her abdomen.  States she "took it easy" for 2 weeks then helped her sister clean an Apt. 2 days ago.  Patient states this caused her significant amount of pain she had to stop frequently to rest.  Patient states she was also short of breath while cleaning.  Patient states she moved several boxes in addition to cleaning.  Patient states that last night she was awakened by sharp pain on her posterior right side which was worse with deep inspiration.  Patient states she is also having significant back pain.  Patient reports difficulty lying flat on her back and taking deep breaths at this time.  Patient has an O2 saturation of 94% on arrival today.   Patient reports a history of asthma and frequent episodes of bronchitis and frozen diaphragm.  EMR reviewed by me.  Patient reports a history of acute, unprovoked pulmonary embolus on the right side in September 2021, history of a paralyzed diaphragm of uncertain etiology, has been seen by neurology for this and history of chronic back pain status post laminectomy at L5-S1 which makes it difficult for her to lie flat.  The history is provided by the patient.    Past Medical History:  Diagnosis Date   Anxiety    Asthma 02/21/2018   Back pain    Cancer (HCC)    Depression    GERD (gastroesophageal reflux disease)    Hypertension    Patient Active Problem List   Diagnosis Date Noted   At risk for obstructive sleep apnea 03/26/2020   History of pulmonary embolus (PE) 03/26/2020   Basal cell carcinoma (BCC)  of right ala nasi 02/21/2019   Basal cell carcinoma (BCC) of right upper eyelid 02/07/2019   Dysphagia 10/30/2018   History of esophageal dilatation 10/30/2018   Chronic right shoulder pain 10/15/2018   Asthma 02/21/2018   Paralyzed hemidiaphragm 02/21/2018   Elevated hemidiaphragm 02/05/2018   Atelectasis 02/05/2018   Anxiety 02/01/2018   Cervical neck pain with evidence of disc disease 02/01/2018   BMI 38.0-38.9,adult 02/01/2018   Cataract 04/01/2015   Depression 04/01/2015   Hyperlipidemia 04/01/2015   Hypertension 04/01/2015   Disorder of intervertebral disc of cervical spine 04/01/2015   Lumbar disc disease 04/01/2015   Fatty (change of) liver, not elsewhere classified 05/29/2012   Past Surgical History:  Procedure Laterality Date   ABDOMINAL HYSTERECTOMY     ARTHROSCOPY WITH ANTERIOR CRUCIATE LIGAMENT (ACL) REPAIR WITH ANTERIOR TIBILIAS GRAFT     BACK SURGERY     EXCISION MASS ABDOMINAL Left 11/12/2020   Procedure: EXCISION LEFT MASS ABDOMINAL;  Surgeon: Kieth Brightly, Arta Bruce, MD;  Location: [facility];  Service: General;  Laterality: Left;   FOOT SURGERY Left    SHOULDER ARTHROSCOPY W/ ROTATOR CUFF REPAIR     SHOULDER OPEN ROTATOR CUFF REPAIR     TUBAL LIGATION     OB History   No obstetric history on file.    Home Medications    Prior to Admission medications  Medication Sig Start Date End Date Taking? Authorizing Provider  albuterol (VENTOLIN HFA) 108 (90 Base) MCG/ACT inhaler Inhale 2 puffs into the lungs every 6 (six) hours as needed for wheezing or shortness of breath. 04/26/22  Yes Samuel Bouche, NP  Cholecalciferol (VITAMIN D) 125 MCG (5000 UT) CAPS Take 5,000 Units by mouth daily.   Yes [provider]  DULoxetine (CYMBALTA) 30 MG capsule TAKE 1 CAPSULE (30 MG TOTAL) BY MOUTH AT BEDTIME. 06/05/22  Yes Jessup, Joy, NP  DULoxetine (CYMBALTA) 60 MG capsule Take 1 capsule (60 mg total) by mouth daily. 06/05/22  Yes Samuel Bouche, NP  famotidine (PEPCID) 40 MG  tablet Take 1 tablet (40 mg total) by mouth at bedtime. 06/08/22  Yes Vladimir Crofts, PA-C  meloxicam (MOBIC) 15 MG tablet Take 1 tablet (15 mg total) by mouth daily. 03/27/22  Yes Samuel Bouche, NP  mupirocin ointment (BACTROBAN) 2 % Apply 1 Application topically 2 (two) times daily as needed. 04/26/22  Yes Samuel Bouche, NP  Na Sulfate-K Sulfate-Mg Sulf 17.5-3.13-1.6 GM/177ML SOLN 1 colonoscopy prep as directed by GI office instructions 06/08/22  Yes Vicie Mutters R, PA-C  omeprazole (PRILOSEC) 40 MG capsule TAKE 1 CAPSULE BY MOUTH EVERY DAY IN THE MORNING AND AT BEDTIME 12/30/21  Yes Samuel Bouche, NP  pregabalin (LYRICA) 75 MG capsule Take 1 capsule (75 mg total) by mouth 3 (three) times daily. 04/26/22  Yes Samuel Bouche, NP  vitamin E 1000 UNIT capsule Take 1,000 Units by mouth daily.   Yes [provider]  fexofenadine (ALLEGRA ALLERGY) 180 MG tablet Take 1 tablet (180 mg total) by mouth daily for 15 days. 05/29/22 06/13/22  Eliezer Lofts, FNP    Family History Family History  Problem Relation Age of Onset   Cancer Mother        lung   Cancer Father        lung   Hypertension Father    Diabetes Father    Breast cancer Sister 68   Breast cancer Maternal Aunt    Colon cancer Maternal Aunt    Esophageal cancer Maternal Uncle    Breast cancer Cousin    Non-Hodgkin's lymphoma Son    Rectal cancer Neg Hx    Stomach cancer Neg Hx    Social History Social History   Tobacco Use   Smoking status: Never    Passive exposure: Yes   Smokeless tobacco: Never  Vaping Use   Vaping Use: Never used  Substance Use Topics   Alcohol use: Yes    Comment: occasionally   Drug use: Never   Allergies   Patient has no known allergies.  Review of Systems Review of Systems Pertinent findings revealed after performing a 14 point review of systems has been noted in the history of present illness.  Physical Exam Vital Signs BP (!) 138/91 (BP Location: Left Arm)   Pulse 90   Temp 98.1 F  (36.7 C) (Oral)   Resp 20   Ht 5' 3"$  (1.6 m)   Wt 200 lb (90.7 kg)   SpO2 94%   BMI 35.43 kg/m   No data found.  Physical Exam Vitals and nursing note reviewed.  Constitutional:      General: She is not in acute distress.    Appearance: Normal appearance. She is not ill-appearing.  HENT:     Head: Normocephalic and atraumatic.     Salivary Glands: Right salivary gland is not diffusely enlarged or tender. Left salivary gland is not diffusely  enlarged or tender.     Right Ear: Tympanic membrane, ear canal and external ear normal. No drainage. No middle ear effusion. There is no impacted cerumen. Tympanic membrane is not erythematous or bulging.     Left Ear: Tympanic membrane, ear canal and external ear normal. No drainage.  No middle ear effusion. There is no impacted cerumen. Tympanic membrane is not erythematous or bulging.     Nose: Nose normal. No nasal deformity, septal deviation, mucosal edema, congestion or rhinorrhea.     Right Turbinates: Not enlarged, swollen or pale.     Left Turbinates: Not enlarged, swollen or pale.     Right Sinus: No maxillary sinus tenderness or frontal sinus tenderness.     Left Sinus: No maxillary sinus tenderness or frontal sinus tenderness.     Mouth/Throat:     Lips: Pink. No lesions.     Mouth: Mucous membranes are moist. No oral lesions.     Pharynx: Oropharynx is clear. Uvula midline. No posterior oropharyngeal erythema or uvula swelling.     Tonsils: No tonsillar exudate. 0 on the right. 0 on the left.  Eyes:     General: Lids are normal.        Right eye: No discharge.        Left eye: No discharge.     Extraocular Movements: Extraocular movements intact.     Conjunctiva/sclera: Conjunctivae normal.     Right eye: Right conjunctiva is not injected.     Left eye: Left conjunctiva is not injected.  Neck:     Trachea: Trachea and phonation normal.  Cardiovascular:     Rate and Rhythm: Normal rate and regular rhythm.     Pulses: Normal  pulses.     Heart sounds: Normal heart sounds. No murmur heard.    No friction rub. No gallop.  Pulmonary:     Effort: Pulmonary effort is normal. No accessory muscle usage, prolonged expiration or respiratory distress.     Breath sounds: No stridor, decreased air movement or transmitted upper airway sounds. Examination of the right-lower field reveals decreased breath sounds and rales. Decreased breath sounds and rales present. No wheezing or rhonchi.  Chest:     Chest wall: No tenderness.  Musculoskeletal:        General: Normal range of motion.     Cervical back: Normal range of motion and neck supple. Normal range of motion.  Lymphadenopathy:     Cervical: No cervical adenopathy.  Skin:    General: Skin is warm and dry.     Findings: No erythema or rash.  Neurological:     General: No focal deficit present.     Mental Status: She is alert and oriented to person, place, and time.  Psychiatric:        Mood and Affect: Mood normal.        Behavior: Behavior normal.     Visual Acuity Right Eye Distance:   Left Eye Distance:   Bilateral Distance:    Right Eye Near:   Left Eye Near:    Bilateral Near:     UC Couse / Diagnostics / Procedures:     Radiology DG Ribs Unilateral Right  Result Date: 07/10/2022 CLINICAL DATA:  [place] [id number] Pain [id number]; Abnormal breath sounds right lower lobe, pleuritic chest pain on the right, history of PE EXAM: RIGHT RIBS - 2 VIEW; CHEST - 2 VIEW COMPARISON:  Chest radiograph 02/22/2022 FINDINGS: There is a possible nondisplaced fracture of the  right anterior eighth rib. Unchanged cardiomediastinal silhouette. Unchanged chronic elevated right hemidiaphragm with adjacent basilar subsegmental atelectasis. No new airspace disease. No pleural effusion or evidence of pneumothorax. Prior right shoulder cuff repair and distal clavicle resection. IMPRESSION: Possible nondisplaced fracture of the right anterior eighth rib. Unchanged chronically  elevated right hemidiaphragm with adjacent basilar subsegmental atelectasis. No acute cardiopulmonary findings. Electronically Signed   By: Maurine Simmering M.D.   On: 07/10/2022 12:45   DG Chest 2 View  Result Date: 07/10/2022 CLINICAL DATA:  [place] [id number] Pain [id number]; Abnormal breath sounds right lower lobe, pleuritic chest pain on the right, history of PE EXAM: RIGHT RIBS - 2 VIEW; CHEST - 2 VIEW COMPARISON:  Chest radiograph 02/22/2022 FINDINGS: There is a possible nondisplaced fracture of the right anterior eighth rib. Unchanged cardiomediastinal silhouette. Unchanged chronic elevated right hemidiaphragm with adjacent basilar subsegmental atelectasis. No new airspace disease. No pleural effusion or evidence of pneumothorax. Prior right shoulder cuff repair and distal clavicle resection. IMPRESSION: Possible nondisplaced fracture of the right anterior eighth rib. Unchanged chronically elevated right hemidiaphragm with adjacent basilar subsegmental atelectasis. No acute cardiopulmonary findings. Electronically Signed   By: Maurine Simmering M.D.   On: 07/10/2022 12:45    Procedures Procedures (including critical care time) EKG  Pending results:  Labs Reviewed - No data to display  Medications Ordered in UC: Medications  ibuprofen (ADVIL) tablet 800 mg (800 mg Oral Given 07/10/22 1158)    UC Diagnoses / Final Clinical Impressions(s)   I have reviewed the triage vital signs and the nursing notes.  Pertinent labs & imaging results that were available during my care of the patient were reviewed by me and considered in my medical decision making (see chart for details).    Final diagnoses:  Closed fracture of one rib of right side, initial encounter   Patient advised of x-ray findings.  After looking at x-ray image, my physical exam findings are consistent with her paralyzed diaphragm as redemonstrated on x-ray today.  Patient advised of rib fracture and advised to use a pillow to brace her  ribs when coughing or needing to clear her throat or take a deep breath.  Patient advised to take ibuprofen 600 to 800 mg every 8 hours along with Tylenol 1000 mg every 8 hours for pain relief.  Rest and activity restriction encouraged.  Return precautions advised.  Please see discharge instructions below for details of plan of care as provided to patient. ED Prescriptions   None    PDMP not reviewed this encounter.  Pending results:  Labs Reviewed - No data to display  Discharge Instructions:   Discharge Instructions      Your x-ray revealed a possible fracture of your right eighth rib which is consistent with the location and quality of your pain.  I recommend that you continue taking ibuprofen 1000 mg every 8 hours along with ibuprofen 600 to 800 mg every 8 hours for the next several days to help keep your pain well-controlled.  You are welcome to apply ice to the affected area for 20 to 30 minutes 3-4 times daily to help reduce inflammation.  I also recommend that you carry a small throat pillow under your right arm so that when you need to cough you can use the pillow as a splint.  As you can imagine, it is not practical to wrap you up in a cast until your bone heals.  Holding a pillow against your ribs when you need to  cough syrups the same purpose.  I recommend that you deep breathe is much as you can, rest is much as you can doing any activities that cause you pain.  If you are not feeling better in the next 7 to 10 days, please follow-up with your primary care provider or with an orthopedic specialist.  Enck you for visiting urgent care today.      Disposition Upon Discharge:  Condition: stable for discharge home  Patient presented with an acute illness with associated systemic symptoms and significant discomfort requiring urgent management. In my opinion, this is a condition that a prudent lay person (someone who possesses an average knowledge of health and medicine) may  potentially expect to result in complications if not addressed urgently such as respiratory distress, impairment of bodily function or dysfunction of bodily organs.   Routine symptom specific, illness specific and/or disease specific instructions were discussed with the patient and/or caregiver at length.   As such, the patient has been evaluated and assessed, work-up was performed and treatment was provided in alignment with urgent care protocols and evidence based medicine.  Patient/parent/caregiver has been advised that the patient may require follow up for further testing and treatment if the symptoms continue in spite of treatment, as clinically indicated and appropriate.  Patient/parent/caregiver has been advised to return to the [facility] or PCP if no better; to PCP or the Emergency Department if new signs and symptoms develop, or if the current signs or symptoms continue to change or worsen for further workup, evaluation and treatment as clinically indicated and appropriate  The patient will follow up with their current PCP if and as advised. If the patient does not currently have a PCP we will assist them in obtaining one.   The patient may need specialty follow up if the symptoms continue, in spite of conservative treatment and management, for further workup, evaluation, consultation and treatment as clinically indicated and appropriate.  Patient/parent/caregiver verbalized understanding and agreement of plan as discussed.  All questions were addressed during visit.  Please see discharge instructions below for further details of plan.  This office note has been dictated using Museum/gallery curator.  Unfortunately, this method of dictation can sometimes lead to typographical or grammatical errors.  I apologize for your inconvenience in advance if this occurs.  Please do not hesitate to reach out to me if clarification is needed.      Lynden Oxford Scales, PA-C 07/10/22 1301

## 2022-07-11 ENCOUNTER — Encounter: Payer: Self-pay | Admitting: Gastroenterology

## 2022-07-16 ENCOUNTER — Encounter: Payer: Self-pay | Admitting: Certified Registered Nurse Anesthetist

## 2022-07-19 ENCOUNTER — Encounter: Payer: Self-pay | Admitting: Gastroenterology

## 2022-07-19 ENCOUNTER — Ambulatory Visit (AMBULATORY_SURGERY_CENTER): Payer: PPO | Admitting: Gastroenterology

## 2022-07-19 VITALS — BP 125/77 | HR 80 | Temp 97.8°F | Resp 21 | Ht 63.0 in | Wt 208.0 lb

## 2022-07-19 DIAGNOSIS — Z86010 Personal history of colonic polyps: Secondary | ICD-10-CM

## 2022-07-19 DIAGNOSIS — K6389 Other specified diseases of intestine: Secondary | ICD-10-CM

## 2022-07-19 DIAGNOSIS — R1310 Dysphagia, unspecified: Secondary | ICD-10-CM | POA: Diagnosis not present

## 2022-07-19 DIAGNOSIS — Z09 Encounter for follow-up examination after completed treatment for conditions other than malignant neoplasm: Secondary | ICD-10-CM

## 2022-07-19 DIAGNOSIS — R1319 Other dysphagia: Secondary | ICD-10-CM | POA: Diagnosis not present

## 2022-07-19 DIAGNOSIS — D122 Benign neoplasm of ascending colon: Secondary | ICD-10-CM

## 2022-07-19 DIAGNOSIS — K319 Disease of stomach and duodenum, unspecified: Secondary | ICD-10-CM | POA: Diagnosis not present

## 2022-07-19 DIAGNOSIS — D124 Benign neoplasm of descending colon: Secondary | ICD-10-CM

## 2022-07-19 DIAGNOSIS — K2100 Gastro-esophageal reflux disease with esophagitis, without bleeding: Secondary | ICD-10-CM | POA: Diagnosis not present

## 2022-07-19 DIAGNOSIS — K219 Gastro-esophageal reflux disease without esophagitis: Secondary | ICD-10-CM

## 2022-07-19 MED ORDER — SODIUM CHLORIDE 0.9 % IV SOLN: 500.0000 mL | Freq: Once | INTRAVENOUS | Status: DC

## 2022-07-19 NOTE — Op Note (Signed)
[facility] Patient Name: [name] Procedure Date: 07/19/2022 3:00 PM MRN: [id number] Endoscopist: Thornton Park MD, MD, [id number] Age: 67 Referring MD:  Date of Birth: 05-27-1955 Gender: Female Account #: [id number] Procedure:                Colonoscopy Indications:              Screening for colorectal malignant neoplasm                           Prior colonoscopy in 1997, 2007, and 2013 Medicines:                Monitored Anesthesia Care Procedure:                Pre-Anesthesia Assessment:                           - Prior to the procedure, a History and Physical                            was performed, and patient medications and                            allergies were reviewed. The patient's tolerance of                            previous anesthesia was also reviewed. The risks                            and benefits of the procedure and the sedation                            options and risks were discussed with the patient.                            All questions were answered, and informed consent                            was obtained. Prior Anticoagulants: The patient has                            taken no anticoagulant or antiplatelet agents. ASA                            Grade Assessment: II - A patient with mild systemic                            disease. After reviewing the risks and benefits,                            the patient was deemed in satisfactory condition to                            undergo the procedure.  After obtaining informed consent, the colonoscope                            was passed under direct vision. Throughout the                            procedure, the patient's blood pressure, pulse, and                            oxygen saturations were monitored continuously. The                            CF [id number] was introduced through the anus                            and advanced to  the the cecum, identified by                            appendiceal orifice and ileocecal valve. A second                            forward view of the right colon was performed. The                            colonoscopy was performed without difficulty. The                            patient tolerated the procedure well. The quality                            of the bowel preparation was good. The ileocecal                            valve, appendiceal orifice, and rectum were                            photographed. Scope In: 3:18:37 PM Scope Out: 3:31:54 PM Scope Withdrawal Time: 0 hours 10 minutes 59 seconds  Total Procedure Duration: 0 hours 13 minutes 17 seconds  Findings:                 The perianal and digital rectal examinations were                            normal.                           A diffuse area of severe melanosis was found in the                            entire colon.                           Two sessile polyps were found in the ascending  colon. The polyps were 2 to 3 mm in size. These                            polyps were removed with a cold snare. Resection                            and retrieval were complete. Estimated blood loss                            was minimal.                           A 2 mm polyp was found in the descending colon. The                            polyp was sessile. The polyp was removed with a                            cold snare. Resection and retrieval were complete.                            Estimated blood loss was minimal.                           The exam was otherwise without abnormality on                            direct and retroflexion views except for internal                            hemorrhoids. Complications:            No immediate complications. Estimated Blood Loss:     Estimated blood loss was minimal. Impression:               - Melanosis in the colon.                            - Internal hemorrhoids.                           - Two 2 to 3 mm polyps in the ascending colon,                            removed with a cold snare. Resected and retrieved.                           - One 2 mm polyp in the descending colon, removed                            with a cold snare. Resected and retrieved.                           - The examination was otherwise normal on direct  and retroflexion views. Recommendation:           - Patient has a contact number available for                            emergencies. The signs and symptoms of potential                            delayed complications were discussed with the                            patient. Return to normal activities tomorrow.                            Written discharge instructions were provided to the                            patient.                           - Resume previous diet.                           - Continue present medications.                           - Await pathology results. Surveillance colonoscopy                            in 3 years if all three polyps are adenomas or                            sessile serrated polyps.                           - Repeat colonoscopy date to be determined after                            pending pathology results are reviewed for                            surveillance.                           - Emerging evidence supports eating a diet of                            fruits, vegetables, grains, calcium, and yogurt                            while reducing red meat and alcohol may reduce the                            risk of colon cancer.                           - Thank  you for allowing me to be involved in your                            colon cancer prevention. Thornton Park MD, MD 07/19/2022 3:45:36 PM This report has been signed electronically.

## 2022-07-19 NOTE — Op Note (Signed)
[facility] Patient Name: [name] Procedure Date: 07/19/2022 3:01 PM MRN: [id number] Endoscopist: Thornton Park MD, MD, [id number] Age: 67 Referring MD:  Date of Birth: 1956/05/15 Gender: Female Account #: [id number] Procedure:                Upper GI endoscopy Indications:              Dysphagia Medicines:                Monitored Anesthesia Care Procedure:                Pre-Anesthesia Assessment:                           - Prior to the procedure, a History and Physical                            was performed, and patient medications and                            allergies were reviewed. The patient's tolerance of                            previous anesthesia was also reviewed. The risks                            and benefits of the procedure and the sedation                            options and risks were discussed with the patient.                            All questions were answered, and informed consent                            was obtained. Prior Anticoagulants: The patient has                            taken no anticoagulant or antiplatelet agents. ASA                            Grade Assessment: II - A patient with mild systemic                            disease. After reviewing the risks and benefits,                            the patient was deemed in satisfactory condition to                            undergo the procedure.                           After obtaining informed consent, the endoscope was  passed under direct vision. Throughout the                            procedure, the patient's blood pressure, pulse, and                            oxygen saturations were monitored continuously. The                            GIF HQ190 [id number] was introduced through the                            mouth, and advanced to the second part of duodenum.                            The upper GI endoscopy was accomplished  without                            difficulty. The patient tolerated the procedure                            well. Scope In: Scope Out: Findings:                 The z-line is slightly irregular. No endoscopic                            abnormality was evident in the esophagus to explain                            the patient's complaint of dysphagia. It was                            decided, however, to proceed with dilation of the                            lower third of the esophagus. A TTS dilator was                            passed through the scope. Dilation with a 16-17-18                            mm balloon dilator was performed to 18 mm. There                            was no resistance to a fully inflated baoon. The                            dilation site was examined and showed no change.                            After dilation, biopsies were obtained from the  mid/proximal and distal esophagus with cold forceps                            for histology of suspected eosinophilic esophagitis.                           Localized mildly erythematous mucosa without                            bleeding was found in the gastric body. Biopsies                            were taken from the antrum, body, and fundus with a                            cold forceps for histology. Estimated blood loss                            was minimal.                           The examined duodenum was normal. Complications:            No immediate complications. Estimated Blood Loss:     Estimated blood loss was minimal. Impression:               - No endoscopic esophageal abnormality to explain                            patient's dysphagia. Esophagus dilated. Dilated.                           - Erythematous mucosa in the gastric body. Biopsied.                           - Normal examined duodenum.                           - Biopsies were taken with a cold  forceps for                            evaluation of eosinophilic esophagitis. Recommendation:           - Patient has a contact number available for                            emergencies. The signs and symptoms of potential                            delayed complications were discussed with the                            patient. Return to normal activities tomorrow.                            Written discharge instructions were provided  to the                            patient.                           - Resume previous diet.                           - Continue present medications.                           - Await pathology results. Thornton Park MD, MD 07/19/2022 3:39:43 PM This report has been signed electronically.

## 2022-07-19 NOTE — Progress Notes (Unsigned)
Report given to PACU, vss 

## 2022-07-19 NOTE — Progress Notes (Unsigned)
Called to room to assist during endoscopic procedure.  Patient ID and intended procedure confirmed with present staff. Received instructions for my participation in the procedure from the performing physician.  

## 2022-07-19 NOTE — Patient Instructions (Addendum)
Follow post dilation diet today. Resume previous medications. Awaiting pathology results. Repeat Colonoscopy date to be determined based on pathology results.  Handouts provided [id number] dilation diet and Colon polyps  YOU HAD AN ENDOSCOPIC PROCEDURE TODAY AT [facility]:   Refer to the procedure report that was given to you for any specific questions about what was found during the examination.  If the procedure report does not answer your questions, please call your gastroenterologist to clarify.  If you requested that your care partner not be given the details of your procedure findings, then the procedure report has been included in a sealed envelope for you to review at your convenience later.  YOU SHOULD EXPECT: Some feelings of bloating in the abdomen. Passage of more gas than usual.  Walking can help get rid of the air that was put into your GI tract during the procedure and reduce the bloating. If you had a lower endoscopy (such as a colonoscopy or flexible sigmoidoscopy) you may notice spotting of blood in your stool or on the toilet paper. If you underwent a bowel prep for your procedure, you may not have a normal bowel movement for a few days.  Please Note:  You might notice some irritation and congestion in your nose or some drainage.  This is from the oxygen used during your procedure.  There is no need for concern and it should clear up in a day or so.  SYMPTOMS TO REPORT IMMEDIATELY:  Following lower endoscopy (colonoscopy or flexible sigmoidoscopy):  Excessive amounts of blood in the stool  Significant tenderness or worsening of abdominal pains  Swelling of the abdomen that is new, acute  Fever of 100F or higher  Following upper endoscopy (EGD)  Vomiting of blood or coffee ground material  New chest pain or pain under the shoulder blades  Painful or persistently difficult swallowing  New shortness of breath  Fever of 100F or higher  Black, tarry-looking  stools  For urgent or emergent issues, a gastroenterologist can be reached at any hour by calling [phone]. Do not use MyChart messaging for urgent concerns.    DIET:  We do recommend a small meal at first, but then you may proceed to your regular diet.  Drink plenty of fluids but you should avoid alcoholic beverages for 24 hours.  ACTIVITY:  You should plan to take it easy for the rest of today and you should NOT DRIVE or use heavy machinery until tomorrow (because of the sedation medicines used during the test).    FOLLOW UP: Our staff will call the number listed on your records the next business day following your procedure.  We will call around 7:15- 8:00 am to check on you and address any questions or concerns that you may have regarding the information given to you following your procedure. If we do not reach you, we will leave a message.     If any biopsies were taken you will be contacted by phone or by letter within the next 1-3 weeks.  Please call us at [phone] if you have not heard about the biopsies in 3 weeks.    SIGNATURES/CONFIDENTIALITY: You and/or your care partner have signed paperwork which will be entered into your electronic medical record.  These signatures attest to the fact that that the information above on your After Visit Summary has been reviewed and is understood.  Full responsibility of the confidentiality of this discharge information lies with you and/or your care-partner.

## 2022-07-19 NOTE — Progress Notes (Unsigned)
1455 Robinul 0.1 mg IV given due large amount of secretions upon assessment.  MD made aware, vss  

## 2022-07-19 NOTE — Progress Notes (Unsigned)
1504 HR > 100 with esmolol 25 mg given IV, MD updated, vss

## 2022-07-19 NOTE — Progress Notes (Signed)
Pt's states no medical or surgical changes since previsit or office visit. 

## 2022-07-19 NOTE — Progress Notes (Unsigned)
Referring Provider: Samuel Bouche, NP Primary Care Physician:  Samuel Bouche, NP   Indication for EGD:  Dysphagia Indication for Colonoscopy:  Colon cancer surveillance   IMPRESSION:  Dysphagia History of colon polyps Need for colon cancer screening Appropriate candidate for monitored anesthesia care  PLAN: EGD and Colonoscopy in the [facility] today   HPI: [name] is a 67 y.o. female presents for EGD and colonoscopy.  11/13/18 EGD Reflux esophagitis presenting with dysphagia and globus- biopsies confirmed reflux and were negative, Empirically dilated with 14F TTS balloon with improvement  Colonoscopy with [facility] in [place] 2013- no polyps of colonoscopy 1997 or 2007 [name]) No known family history of colon cancer or polyps Family history of pancreatic cancer (two maternal aunts in their 27s)   States last 4 months she has been sick every month, states having worsening cough/bronchitis, has hoarseness constant.  She has had worsening dysphagia, has breads and meats she needs to be careful with and cut small. No issues with liquids. Drinking water helps clear.  Denies GERD, she is on omeprazole 40 mg BID.  Lisinopril for 6 months.  No nausea, vomiting, melena, AB pain.  She takes [place], has BM daily.    She is on mobic 15 mg daily for a year, has , 1-2 vodka drinks likely one shot per drink. No smoking.  Has 5 grandkids and keeps them during the week.    Prior abdominal imaging includes: CT abdomen and pelvis with contrast 8/1/7 for left lower quadrant abdominal pain showed a small left ovarian cyst but was otherwise normal Abdominal ultrasound 03/22/2012 for abdominal pain: Fatty liver otherwise normal HIDA scan with CCK 03/28/2012: Normal.  Gallbladder EF 93%.   Prior endoscopic evaluation includes: - Colonoscopy 06/04/1995 for left lower quadrant pain was normal.  She was diagnosed with IBS. - Colonoscopy with Dr. [name] for constipation,  abdominal pain and bloating 8/24/7 showed melanosis coli.  He gave her diagnosis of chronic functional constipation.  She was prescribed Amitiza 24 mcg twice daily. - EGD for dysphasia 8/24/7 with Dr. [name] showed a prolapsing 2 cm hiatal hernia, no esophageal stricture, gastritis and duodenal bulb erosions.  Duodenal biopsies were normal.  She was dilated with a 56 [place] Maloney dilator.  She was prescribed lansoprazole 30 mg every morning.        Past Medical History:  Diagnosis Date   Anxiety    Asthma 02/21/2018   Back pain    Cancer (HCC)    Depression    GERD (gastroesophageal reflux disease)    Hypertension     Past Surgical History:  Procedure Laterality Date   ABDOMINAL HYSTERECTOMY     ARTHROSCOPY WITH ANTERIOR CRUCIATE LIGAMENT (ACL) REPAIR WITH ANTERIOR TIBILIAS GRAFT     BACK SURGERY     EXCISION MASS ABDOMINAL Left 11/12/2020   Procedure: EXCISION LEFT MASS ABDOMINAL;  Surgeon: Kieth Brightly, Arta Bruce, MD;  Location: [facility];  Service: General;  Laterality: Left;   FOOT SURGERY Left    SHOULDER ARTHROSCOPY W/ ROTATOR CUFF REPAIR     SHOULDER OPEN ROTATOR CUFF REPAIR     TUBAL LIGATION      Current Outpatient Medications  Medication Sig Dispense Refill   albuterol (VENTOLIN HFA) 108 (90 Base) MCG/ACT inhaler Inhale 2 puffs into the lungs every 6 (six) hours as needed for wheezing or shortness of breath. 1 each 1   Cholecalciferol (VITAMIN D) 125 MCG (5000 UT) CAPS Take 5,000 Units by mouth daily.  DULoxetine (CYMBALTA) 30 MG capsule TAKE 1 CAPSULE (30 MG TOTAL) BY MOUTH AT BEDTIME. 90 capsule 3   DULoxetine (CYMBALTA) 60 MG capsule Take 1 capsule (60 mg total) by mouth daily. 90 capsule 3   famotidine (PEPCID) 40 MG tablet Take 1 tablet (40 mg total) by mouth at bedtime. 90 tablet 0   fexofenadine (ALLEGRA ALLERGY) 180 MG tablet Take 1 tablet (180 mg total) by mouth daily for 15 days. 15 tablet 0   meloxicam (MOBIC) 15 MG tablet Take 1 tablet (15 mg total) by  mouth daily. 90 tablet 1   mupirocin ointment (BACTROBAN) 2 % Apply 1 Application topically 2 (two) times daily as needed. 22 g 0   Na Sulfate-K Sulfate-Mg Sulf 17.5-3.13-1.6 GM/177ML SOLN 1 colonoscopy prep as directed by GI office instructions 354 mL 0   omeprazole (PRILOSEC) 40 MG capsule TAKE 1 CAPSULE BY MOUTH EVERY DAY IN THE MORNING AND AT BEDTIME 180 capsule 1   pregabalin (LYRICA) 75 MG capsule Take 1 capsule (75 mg total) by mouth 3 (three) times daily. 270 capsule 3   vitamin E 1000 UNIT capsule Take 1,000 Units by mouth daily.     Current Facility-Administered Medications  Medication Dose Route Frequency Provider Last Rate Last Admin   0.9 %  sodium chloride infusion  500 mL Intravenous Once Thornton Park, MD        Allergies as of 07/19/2022   (No Known Allergies)    Family History  Problem Relation Age of Onset   Cancer Mother        lung   Cancer Father        lung   Hypertension Father    Diabetes Father    Breast cancer Sister 36   Breast cancer Maternal Aunt    Colon cancer Maternal Aunt    Esophageal cancer Maternal Uncle    Breast cancer Cousin    Non-Hodgkin's lymphoma Son    Rectal cancer Neg Hx    Stomach cancer Neg Hx      Physical Exam: General:   Alert,  well-nourished, pleasant and cooperative in NAD Head:  Normocephalic and atraumatic. Eyes:  Sclera clear, no icterus.   Conjunctiva pink. Mouth:  No deformity or lesions.   Neck:  Supple; no masses or thyromegaly. Lungs:  Clear throughout to auscultation.   No wheezes. Heart:  Regular rate and rhythm; no murmurs. Abdomen:  Soft, non-tender, nondistended, normal bowel sounds, no rebound or guarding.  Msk:  Symmetrical. No boney deformities LAD: No inguinal or umbilical LAD Extremities:  No clubbing or edema. Neurologic:  Alert and  oriented x4;  grossly nonfocal Skin:  No obvious rash or bruise. Psych:  Alert and cooperative. Normal mood and affect.     Studies/Results: No results  found.    Chaquita Basques L. Tarri Glenn, MD, MPH 07/19/2022, 2:18 PM

## 2022-07-19 NOTE — Progress Notes (Unsigned)
1520 HR > 100 with esmolol 25 mg given IV, MD updated, vss

## 2022-07-20 ENCOUNTER — Telehealth: Payer: Self-pay

## 2022-07-20 NOTE — Telephone Encounter (Signed)
  Follow up Call-     07/19/2022    2:17 PM  Call back number  Post procedure Call Back phone  # [phone]  Permission to leave phone message Yes     Patient questions:  Do you have a fever, pain , or abdominal swelling? No. Pain Score  0 *  Have you tolerated food without any problems? Yes.    Have you been able to return to your normal activities? Yes.    Do you have any questions about your discharge instructions: Diet   No. Medications  No. Follow up visit  No.  Do you have questions or concerns about your Care? No.  Actions: * If pain score is 4 or above: No action needed, pain <4.

## 2022-07-28 ENCOUNTER — Encounter: Payer: Self-pay | Admitting: Gastroenterology

## 2022-07-31 ENCOUNTER — Encounter: Payer: Self-pay | Admitting: Medical-Surgical

## 2022-08-14 ENCOUNTER — Encounter: Payer: Self-pay | Admitting: Gastroenterology

## 2022-08-17 ENCOUNTER — Other Ambulatory Visit: Payer: Self-pay | Admitting: Medical-Surgical

## 2022-08-17 DIAGNOSIS — H2513 Age-related nuclear cataract, bilateral: Secondary | ICD-10-CM | POA: Diagnosis not present

## 2022-08-17 DIAGNOSIS — F419 Anxiety disorder, unspecified: Secondary | ICD-10-CM

## 2022-08-17 DIAGNOSIS — H353132 Nonexudative age-related macular degeneration, bilateral, intermediate dry stage: Secondary | ICD-10-CM | POA: Diagnosis not present

## 2022-08-17 DIAGNOSIS — Z780 Asymptomatic menopausal state: Secondary | ICD-10-CM

## 2022-08-17 NOTE — Telephone Encounter (Signed)
Sent patient mychart message to inform her that medication has been sent.

## 2022-09-05 ENCOUNTER — Other Ambulatory Visit: Payer: Self-pay | Admitting: Physician Assistant

## 2022-09-07 ENCOUNTER — Inpatient Hospital Stay: Payer: PPO

## 2022-09-07 ENCOUNTER — Ambulatory Visit: Payer: PPO | Admitting: Hematology & Oncology

## 2022-09-11 ENCOUNTER — Encounter: Payer: Self-pay | Admitting: Medical-Surgical

## 2022-09-12 ENCOUNTER — Inpatient Hospital Stay: Payer: PPO

## 2022-09-12 ENCOUNTER — Inpatient Hospital Stay: Payer: PPO | Admitting: Hematology & Oncology

## 2022-09-22 ENCOUNTER — Other Ambulatory Visit: Payer: Self-pay | Admitting: Medical-Surgical

## 2022-09-29 DIAGNOSIS — M7662 Achilles tendinitis, left leg: Secondary | ICD-10-CM | POA: Diagnosis not present

## 2022-09-29 DIAGNOSIS — M7661 Achilles tendinitis, right leg: Secondary | ICD-10-CM | POA: Diagnosis not present

## 2022-10-20 DIAGNOSIS — M25572 Pain in left ankle and joints of left foot: Secondary | ICD-10-CM | POA: Diagnosis not present

## 2022-10-21 ENCOUNTER — Other Ambulatory Visit: Payer: Self-pay | Admitting: Medical-Surgical

## 2022-10-24 ENCOUNTER — Ambulatory Visit (INDEPENDENT_AMBULATORY_CARE_PROVIDER_SITE_OTHER): Payer: PPO | Admitting: Medical-Surgical

## 2022-10-24 DIAGNOSIS — Z0000 Encounter for general adult medical examination without abnormal findings: Secondary | ICD-10-CM

## 2022-10-24 DIAGNOSIS — Z780 Asymptomatic menopausal state: Secondary | ICD-10-CM

## 2022-10-24 NOTE — Progress Notes (Signed)
MEDICARE ANNUAL WELLNESS VISIT  10/24/2022  Telephone Visit Disclaimer This Medicare AWV was conducted by telephone due to national recommendations for restrictions regarding the COVID-19 Pandemic (e.g. social distancing).  I verified, using two identifiers, that I am speaking with [name] or their authorized healthcare agent. I discussed the limitations, risks, security, and privacy concerns of performing an evaluation and management service by telephone and the potential availability of an in-person appointment in the future. The patient expressed understanding and agreed to proceed.  Location of Patient: Home Location of Provider (nurse):  In the office.  Subjective:    [name] is a 67 y.o. female patient of Christen Butter, NP who had a Medicare Annual Wellness Visit today via telephone. [name] is Retired and lives with their spouse. she has 2 children. she reports that she is socially active and does interact with friends/family regularly. she is minimally physically active and enjoys spending time with her grandchildren.  Patient Care Team: Christen Butter, NP as PCP - General (Nurse Practitioner) Gabriel Carina, [facility] as Pharmacist (Pharmacist)     10/24/2022    8:06 AM 03/08/2022    8:14 AM 10/19/2021    8:06 AM 07/28/2021   10:31 AM 07/06/2021    9:56 AM 04/21/2021    2:43 PM 11/05/2020    1:07 PM  Advanced Directives  Does Patient Have a Medical Advance Directive? No No No No No No No  Would patient like information on creating a medical advance directive? No - Patient declined  No - Patient declined No - Patient declined  No - Patient declined     Hospital Utilization Over the Past 12 Months: # of hospitalizations or ER visits: 0 # of surgeries: 0  Review of Systems    Patient reports that her overall health is unchanged compared to last year.  History obtained from chart review and the patient  Patient Reported Readings (BP, Pulse, CBG, Weight, etc) none  Pain  Assessment Pain : 0-10 Pain Score: 7  Pain Type: Chronic pain Pain Location: Foot Pain Descriptors / Indicators: Constant Pain Onset: More than a month ago Pain Frequency: Constant Pain Relieving Factors: none  Pain Relieving Factors: none  Current Medications & Allergies (verified) Allergies as of 10/24/2022   No Known Allergies      Medication List        Accurate as of October 24, 2022  8:15 AM. If you have any questions, ask your nurse or doctor.          albuterol 108 (90 Base) MCG/ACT inhaler Commonly known as: VENTOLIN HFA Inhale 2 puffs into the lungs every 6 (six) hours as needed for wheezing or shortness of breath.   buPROPion 300 MG 24 hr tablet Commonly known as: WELLBUTRIN XL TAKE 1 TABLET BY MOUTH EVERY DAY   DULoxetine 30 MG capsule Commonly known as: CYMBALTA TAKE 1 CAPSULE (30 MG TOTAL) BY MOUTH AT BEDTIME.   DULoxetine 60 MG capsule Commonly known as: CYMBALTA Take 1 capsule (60 mg total) by mouth daily.   famotidine 40 MG tablet Commonly known as: PEPCID TAKE 1 TABLET BY MOUTH EVERYDAY AT BEDTIME   fexofenadine 180 MG tablet Commonly known as: Allegra Allergy Take 1 tablet (180 mg total) by mouth daily for 15 days.   meloxicam 15 MG tablet Commonly known as: MOBIC TAKE 1 TABLET (15 MG TOTAL) BY MOUTH DAILY.   mupirocin ointment 2 % Commonly known as: BACTROBAN Apply 1 Application topically 2 (  two) times daily as needed.   omeprazole 40 MG capsule Commonly known as: PRILOSEC TAKE 1 CAPSULE BY MOUTH EVERY DAY IN THE MORNING AND AT BEDTIME   pregabalin 75 MG capsule Commonly known as: LYRICA Take 1 capsule (75 mg total) by mouth 3 (three) times daily.   Vitamin D 125 MCG (5000 UT) Caps Take 5,000 Units by mouth daily.   vitamin E 1000 UNIT capsule Take 1,000 Units by mouth daily.        History (reviewed): Past Medical History:  Diagnosis Date   Anxiety    Asthma 02/21/2018   Back pain    Cancer (HCC)    Depression     GERD (gastroesophageal reflux disease)    Hypertension    Past Surgical History:  Procedure Laterality Date   ABDOMINAL HYSTERECTOMY     ARTHROSCOPY WITH ANTERIOR CRUCIATE LIGAMENT (ACL) REPAIR WITH ANTERIOR TIBILIAS GRAFT     BACK SURGERY     EXCISION MASS ABDOMINAL Left 11/12/2020   Procedure: EXCISION LEFT MASS ABDOMINAL;  Surgeon: Kinsinger, De Blanch, MD;  Location: [facility];  Service: General;  Laterality: Left;   FOOT SURGERY Left    SHOULDER ARTHROSCOPY W/ ROTATOR CUFF REPAIR     SHOULDER OPEN ROTATOR CUFF REPAIR     TUBAL LIGATION     Family History  Problem Relation Age of Onset   Cancer Mother        lung   Cancer Father        lung   Hypertension Father    Diabetes Father    Breast cancer Sister 71   Breast cancer Maternal Aunt    Colon cancer Maternal Aunt    Esophageal cancer Maternal Uncle    Breast cancer Cousin    Non-Hodgkin's lymphoma Son    Rectal cancer Neg Hx    Stomach cancer Neg Hx    Social History   Socioeconomic History   Marital status: Married    Spouse name: Tim   Number of children: 2   Years of education: 12   Highest education level: 12th grade  Occupational History   Occupation: retired  Tobacco Use   Smoking status: Never    Passive exposure: Yes   Smokeless tobacco: Never  Vaping Use   Vaping Use: Never used  Substance and Sexual Activity   Alcohol use: Yes    Comment: occasionally   Drug use: Never   Sexual activity: Yes    Birth control/protection: Post-menopausal, Surgical    Comment: hysterectomy  Other Topics Concern   Not on file  Social History Narrative   Lives with husband. She has two children. She enjoys spending time with grandchildren.   Social Determinants of Health   Financial Resource Strain: Low Risk  (10/24/2022)   Overall Financial Resource Strain (CARDIA)    Difficulty of Paying Living Expenses: Not hard at all  Food Insecurity: No Food Insecurity (10/24/2022)   Hunger Vital Sign    Worried About  Running Out of Food in the Last Year: Never true    Ran Out of Food in the Last Year: Never true  Transportation Needs: No Transportation Needs (10/24/2022)   PRAPARE - Administrator, Civil Service (Medical): No    Lack of Transportation (Non-Medical): No  Physical Activity: Inactive (10/24/2022)   Exercise Vital Sign    Days of Exercise per Week: 0 days    Minutes of Exercise per Session: 0 min  Stress: No Stress Concern Present (10/24/2022)  [employer] of Occupational Health - Occupational Stress Questionnaire    Feeling of Stress : Not at all  Social Connections: Moderately Integrated (10/24/2022)   Social Connection and Isolation Panel [NHANES]    Frequency of Communication with Friends and Family: More than three times a week    Frequency of Social Gatherings with Friends and Family: Once a week    Attends Religious Services: More than 4 times per year    Active Member of [employer] or Organizations: No    Attends Banker Meetings: Never    Marital Status: Married    Activities of Daily Living    10/24/2022    8:09 AM  In your present state of health, do you have any difficulty performing the following activities:  Hearing? 0  Vision? 1  Comment macular degeneration  Difficulty concentrating or making decisions? 1  Comment some memory loss  Walking or climbing stairs? 1  Comment due to bilateral lower extremity pain.  Dressing or bathing? 0  Doing errands, shopping? 0  Preparing Food and eating ? N  Using the Toilet? N  In the past six months, have you accidently leaked urine? N  Do you have problems with loss of bowel control? N  Managing your Medications? N  Managing your Finances? N  Housekeeping or managing your Housekeeping? N    Patient Education/ Literacy How often do you need to have someone help you when you read instructions, pamphlets, or other written materials from your doctor or pharmacy?: 1 - Never What is the last grade level you  completed in school?: 12th grade  Exercise Current Exercise Habits: The patient does not participate in regular exercise at present, Exercise limited by: orthopedic condition(s)  Diet Patient reports consuming 2 meals a day and 0 snack(s) a day Patient reports that her primary diet is: Regular Patient reports that she does have regular access to food.   Depression Screen    10/24/2022    8:06 AM 04/26/2022   11:00 AM 10/19/2021    8:06 AM 06/14/2021    9:39 AM 02/05/2018    9:03 AM 02/01/2018   11:40 AM  PHQ 2/9 Scores  PHQ - 2 Score 0 0 0 0 2 1  PHQ- 9 Score   0  8 8  Exception Documentation      Medical reason     Fall Risk    10/24/2022    8:06 AM 10/19/2021    8:06 AM 06/14/2021    9:39 AM  Fall Risk   Falls in the past year? 1 0 0  Number falls in past yr: 1 0 0  Injury with Fall? 0 0 0  Risk for fall due to : History of fall(s);Impaired mobility;Impaired balance/gait No Fall Risks   Follow up Falls evaluation completed;Education provided;Falls prevention discussed Falls evaluation completed Falls evaluation completed     Objective:  URAINA MANKIN seemed alert and oriented and she participated appropriately during our telephone visit.  Blood Pressure Weight BMI  BP Readings from Last 3 Encounters:  07/19/22 125/77  07/10/22 (!) 138/91  06/08/22 110/62   Wt Readings from Last 3 Encounters:  07/19/22 208 lb (94.3 kg)  07/10/22 200 lb (90.7 kg)  06/08/22 208 lb (94.3 kg)   BMI Readings from Last 1 Encounters:  07/19/22 36.85 kg/m    *Unable to obtain current vital signs, weight, and BMI due to telephone visit type  Hearing/Vision  Madeliene did not seem to have difficulty with  hearing/understanding during the telephone conversation Reports that she has had a formal eye exam by an eye care professional within the past year Reports that she has not had a formal hearing evaluation within the past year *Unable to fully assess hearing and vision during telephone visit  type  Cognitive Function:    10/24/2022    8:11 AM 10/19/2021    8:13 AM  6CIT Screen  What Year? 0 points 0 points  What month? 0 points 0 points  What time? 0 points 0 points  Count back from 20 0 points 0 points  Months in reverse 0 points 0 points  Repeat phrase 0 points 0 points  Total Score 0 points 0 points   (Normal:0-7, Significant for Dysfunction: >8)  Normal Cognitive Function Screening: Yes   Immunization & Health Maintenance Record Immunization History  Administered Date(s) Administered   Moderna Sars-Covid-2 Vaccination 08/21/2019, 09/16/2019, 05/12/2020   PNEUMOCOCCAL CONJUGATE-20 06/14/2021   Tdap 11/12/2019   Zoster Recombinat (Shingrix) 06/14/2021, 12/30/2021    Health Maintenance  Topic Date Due   COVID-19 Vaccine (4 - 2023-24 season) 11/09/2022 (Originally 01/20/2022)   DEXA SCAN  10/24/2023 (Originally 04/04/2020)   INFLUENZA VACCINE  12/21/2022   Medicare Annual Wellness (AWV)  10/24/2023   MAMMOGRAM  02/03/2024   Colonoscopy  07/19/2029   DTaP/Tdap/Td (2 - Td or Tdap) 11/11/2029   Pneumonia Vaccine 39+ Years old  Completed   Hepatitis C Screening  Completed   Zoster Vaccines- Shingrix  Completed   HPV VACCINES  Aged Out       Assessment  This is a routine wellness examination for [employer].  Health Maintenance: Due or Overdue There are no preventive care reminders to display for this patient.   Stan Head Noone does not need a referral for Community Assistance: Care Management:   no Social Work:    no Prescription Assistance:  no Nutrition/Diabetes Education:  no   Plan:  Personalized Goals  Goals Addressed               This Visit's Progress     Patient Stated (pt-stated)        Patient stated that she would like to work on losing weight.       Personalized Health Maintenance & Screening Recommendations  Bone densitometry screening  Lung Cancer Screening Recommended: no (Low Dose CT Chest recommended if Age 13-80  years, 20 pack-year currently smoking OR have quit w/in past 15 years) Hepatitis C Screening recommended: no HIV Screening recommended: no  Advanced Directives: Written information was not prepared per patient's request.  Referrals & Orders Orders Placed This Encounter  Procedures   DEXAScan    Follow-up Plan Follow-up with Christen Butter, NP as planned Medicare wellness visit in one year.  Patient will access AVS on my chart.   I have personally reviewed and noted the following in the patient's chart:   Medical and social history Use of alcohol, tobacco or illicit drugs  Current medications and supplements Functional ability and status Nutritional status Physical activity Advanced directives List of other physicians Hospitalizations, surgeries, and ER visits in previous 12 months Vitals Screenings to include cognitive, depression, and falls Referrals and appointments  In addition, I have reviewed and discussed with [name] certain preventive protocols, quality metrics, and best practice recommendations. A written personalized care plan for preventive services as well as general preventive health recommendations is available and can be mailed to the patient at her request.  Modesto Charon, RN BSN  10/24/2022

## 2022-10-24 NOTE — Patient Instructions (Signed)
MEDICARE ANNUAL WELLNESS VISIT Health Maintenance Summary and Written Plan of Care  Ms. Hain ,  Thank you for allowing me to perform your Medicare Annual Wellness Visit and for your ongoing commitment to your health.   Health Maintenance & Immunization History Health Maintenance  Topic Date Due   COVID-19 Vaccine (4 - 2023-24 season) 11/09/2022 (Originally 01/20/2022)   DEXA SCAN  10/24/2023 (Originally 04/04/2020)   INFLUENZA VACCINE  12/21/2022   Medicare Annual Wellness (AWV)  10/24/2023   MAMMOGRAM  02/03/2024   Colonoscopy  07/19/2029   DTaP/Tdap/Td (2 - Td or Tdap) 11/11/2029   Pneumonia Vaccine 43+ Years old  Completed   Hepatitis C Screening  Completed   Zoster Vaccines- Shingrix  Completed   HPV VACCINES  Aged Out   Immunization History  Administered Date(s) Administered   [employer] Vaccination 08/21/2019, 09/16/2019, 05/12/2020   PNEUMOCOCCAL CONJUGATE-20 06/14/2021   Tdap 11/12/2019   Zoster Recombinat (Shingrix) 06/14/2021, 12/30/2021    These are the patient goals that we discussed:  Goals Addressed               This Visit's Progress     Patient Stated (pt-stated)        Patient stated that she would like to work on losing weight.         This is a list of Health Maintenance Items that are overdue or due now: Bone densitometry screening  Orders/Referrals Placed Today: Orders Placed This Encounter  Procedures   DEXAScan    Standing Status:   Future    Standing Expiration Date:   10/24/2023    Scheduling Instructions:     Please call patient to schedule.    Order Specific Question:   Reason for exam:    Answer:   post menopausal    Order Specific Question:   Preferred imaging location?    Answer:   [facility]    (Contact our referral department at [phone] if you have not spoken with someone about your referral appointment within the next 5 days)    Follow-up Plan Follow-up with Christen Butter, NP as  planned Medicare wellness visit in one year.  Patient will access AVS on my chart.      Bone Density Test A bone density test uses a type of X-ray to measure the amount of calcium and other minerals in a person's bones. It can measure bone density in the hip and the spine. The test is similar to having a regular X-ray. This test may also be called: Bone densitometry. Bone mineral density test. Dual-energy X-ray absorptiometry (DEXA). You may have this test to: Diagnose a condition that causes weak or thin bones (osteoporosis). Screen you for osteoporosis. Predict your risk for a broken bone (fracture). Determine how well your osteoporosis treatment is working. Tell a health care provider about: Any allergies you have. All medicines you are taking, including vitamins, herbs, eye drops, creams, and over-the-counter medicines. Any problems you or family members have had with anesthetic medicines. Any blood disorders you have. Any surgeries you have had. Any medical conditions you have. Whether you are pregnant or may be pregnant. Any medical tests you have had within the past 14 days that used contrast material. What are the risks? Generally, this is a safe test. However, it does expose you to a small amount of radiation, which can slightly increase your cancer risk. What happens before the test? Do not take any calcium supplements within the 24 hours before your test.  You will need to remove all metal jewelry, eyeglasses, removable dental appliances, and any other metal objects on your body. What happens during the test?  You will lie down on an exam table. There will be an X-ray generator below you and an imaging device above you. Other devices, such as boxes or braces, may be used to position your body properly for the scan. The machine will slowly scan your body. You will need to keep very still while the machine does the scan. The images will show up on a screen in the room.  Images will be examined by a specialist after your test is finished. The procedure may vary among health care providers and hospitals. What can I expect after the test? It is up to you to get the results of your test. Ask your health care provider, or the department that is doing the test, when your results will be ready. Summary A bone density test is an imaging test that uses a type of X-ray to measure the amount of calcium and other minerals in your bones. The test may be used to diagnose or screen you for a condition that causes weak or thin bones (osteoporosis), predict your risk for a broken bone (fracture), or determine how well your osteoporosis treatment is working. Do not take any calcium supplements within 24 hours before your test. Ask your health care provider, or the department that is doing the test, when your results will be ready. This information is not intended to replace advice given to you by your health care provider. Make sure you discuss any questions you have with your health care provider. Document Revised: 01/19/2021 Document Reviewed: 10/23/2019 Elsevier Patient Education  2024 Elsevier Inc.  Health Maintenance, Female Adopting a healthy lifestyle and getting preventive care are important in promoting health and wellness. Ask your health care provider about: The right schedule for you to have regular tests and exams. Things you can do on your own to prevent diseases and keep yourself healthy. What should I know about diet, weight, and exercise? Eat a healthy diet  Eat a diet that includes plenty of vegetables, fruits, low-fat dairy products, and lean protein. Do not eat a lot of foods that are high in solid fats, added sugars, or sodium. Maintain a healthy weight Body mass index (BMI) is used to identify weight problems. It estimates body fat based on height and weight. Your health care provider can help determine your BMI and help you achieve or maintain a healthy  weight. Get regular exercise Get regular exercise. This is one of the most important things you can do for your health. Most adults should: Exercise for at least 150 minutes each week. The exercise should increase your heart rate and make you sweat (moderate-intensity exercise). Do strengthening exercises at least twice a week. This is in addition to the moderate-intensity exercise. Spend less time sitting. Even light physical activity can be beneficial. Watch cholesterol and blood lipids Have your blood tested for lipids and cholesterol at 68 years of age, then have this test every 5 years. Have your cholesterol levels checked more often if: Your lipid or cholesterol levels are high. You are older than 68 years of age. You are at high risk for heart disease. What should I know about cancer screening? Depending on your health history and family history, you may need to have cancer screening at various ages. This may include screening for: Breast cancer. Cervical cancer. Colorectal cancer. Skin cancer. Lung cancer.  What should I know about heart disease, diabetes, and high blood pressure? Blood pressure and heart disease High blood pressure causes heart disease and increases the risk of stroke. This is more likely to develop in people who have high blood pressure readings or are overweight. Have your blood pressure checked: Every 3-5 years if you are 25-70 years of age. Every year if you are 45 years old or older. Diabetes Have regular diabetes screenings. This checks your fasting blood sugar level. Have the screening done: Once every three years after age 60 if you are at a normal weight and have a low risk for diabetes. More often and at a younger age if you are overweight or have a high risk for diabetes. What should I know about preventing infection? Hepatitis B If you have a higher risk for hepatitis B, you should be screened for this virus. Talk with your health care provider to  find out if you are at risk for hepatitis B infection. Hepatitis C Testing is recommended for: Everyone born from [birth year] through 1966. Anyone with known risk factors for hepatitis C. Sexually transmitted infections (STIs) Get screened for STIs, including gonorrhea and chlamydia, if: You are sexually active and are younger than 68 years of age. You are older than 68 years of age and your health care provider tells you that you are at risk for this type of infection. Your sexual activity has changed since you were last screened, and you are at increased risk for chlamydia or gonorrhea. Ask your health care provider if you are at risk. Ask your health care provider about whether you are at high risk for HIV. Your health care provider may recommend a prescription medicine to help prevent HIV infection. If you choose to take medicine to prevent HIV, you should first get tested for HIV. You should then be tested every 3 months for as long as you are taking the medicine. Pregnancy If you are about to stop having your period (premenopausal) and you may become pregnant, seek counseling before you get pregnant. Take 400 to 800 micrograms (mcg) of folic acid every day if you become pregnant. Ask for birth control (contraception) if you want to prevent pregnancy. Osteoporosis and menopause Osteoporosis is a disease in which the bones lose minerals and strength with aging. This can result in bone fractures. If you are 73 years old or older, or if you are at risk for osteoporosis and fractures, ask your health care provider if you should: Be screened for bone loss. Take a calcium or vitamin D supplement to lower your risk of fractures. Be given hormone replacement therapy (HRT) to treat symptoms of menopause. Follow these instructions at home: Alcohol use Do not drink alcohol if: Your health care provider tells you not to drink. You are pregnant, may be pregnant, or are planning to become pregnant. If you  drink alcohol: Limit how much you have to: 0-1 drink a day. Know how much alcohol is in your drink. In the [place] one drink equals one 12 oz bottle of beer (355 mL), one 5 oz glass of wine (148 mL), or one 1 oz glass of hard liquor (44 mL). Lifestyle Do not use any products that contain nicotine or tobacco. These products include cigarettes, chewing tobacco, and vaping devices, such as e-cigarettes. If you need help quitting, ask your health care provider. Do not use street drugs. Do not share needles. Ask your health care provider for help if you need support or information  about quitting drugs. General instructions Schedule regular health, dental, and eye exams. Stay current with your vaccines. Tell your health care provider if: You often feel depressed. You have ever been abused or do not feel safe at home. Summary Adopting a healthy lifestyle and getting preventive care are important in promoting health and wellness. Follow your health care provider's instructions about healthy diet, exercising, and getting tested or screened for diseases. Follow your health care provider's instructions on monitoring your cholesterol and blood pressure. This information is not intended to replace advice given to you by your health care provider. Make sure you discuss any questions you have with your health care provider. Document Revised: 09/27/2020 Document Reviewed: 09/27/2020 Elsevier Patient Education  2024 [employer].

## 2022-10-26 ENCOUNTER — Ambulatory Visit: Payer: PPO | Admitting: Medical-Surgical

## 2022-11-06 DIAGNOSIS — M25572 Pain in left ankle and joints of left foot: Secondary | ICD-10-CM | POA: Diagnosis not present

## 2022-11-06 DIAGNOSIS — M79672 Pain in left foot: Secondary | ICD-10-CM | POA: Diagnosis not present

## 2022-11-07 ENCOUNTER — Encounter: Payer: Self-pay | Admitting: Medical-Surgical

## 2022-11-13 ENCOUNTER — Ambulatory Visit: Payer: PPO | Admitting: Medical-Surgical

## 2022-11-13 DIAGNOSIS — M19071 Primary osteoarthritis, right ankle and foot: Secondary | ICD-10-CM | POA: Diagnosis not present

## 2022-11-13 DIAGNOSIS — M19072 Primary osteoarthritis, left ankle and foot: Secondary | ICD-10-CM | POA: Diagnosis not present

## 2022-11-15 ENCOUNTER — Encounter: Payer: Self-pay | Admitting: Medical-Surgical

## 2022-11-15 ENCOUNTER — Ambulatory Visit (INDEPENDENT_AMBULATORY_CARE_PROVIDER_SITE_OTHER): Payer: PPO | Admitting: Medical-Surgical

## 2022-11-15 VITALS — BP 149/80 | HR 90 | Resp 20 | Ht 63.0 in | Wt 211.1 lb

## 2022-11-15 DIAGNOSIS — F3341 Major depressive disorder, recurrent, in partial remission: Secondary | ICD-10-CM

## 2022-11-15 DIAGNOSIS — F419 Anxiety disorder, unspecified: Secondary | ICD-10-CM

## 2022-11-15 DIAGNOSIS — I499 Cardiac arrhythmia, unspecified: Secondary | ICD-10-CM | POA: Diagnosis not present

## 2022-11-15 DIAGNOSIS — I10 Essential (primary) hypertension: Secondary | ICD-10-CM | POA: Diagnosis not present

## 2022-11-15 DIAGNOSIS — K219 Gastro-esophageal reflux disease without esophagitis: Secondary | ICD-10-CM

## 2022-11-15 DIAGNOSIS — J4520 Mild intermittent asthma, uncomplicated: Secondary | ICD-10-CM | POA: Diagnosis not present

## 2022-11-15 DIAGNOSIS — G8929 Other chronic pain: Secondary | ICD-10-CM | POA: Diagnosis not present

## 2022-11-15 DIAGNOSIS — M79671 Pain in right foot: Secondary | ICD-10-CM | POA: Diagnosis not present

## 2022-11-15 DIAGNOSIS — M79672 Pain in left foot: Secondary | ICD-10-CM

## 2022-11-15 MED ORDER — PREGABALIN 150 MG PO CAPS: ORAL_CAPSULE | ORAL | 1 refills | Status: DC

## 2022-11-15 NOTE — Progress Notes (Signed)
Established patient visit  History, exam, impression, and plan:  1. Anxiety 2. Recurrent major depressive disorder, in partial remission [facility]) Very pleasant 67 year old female accompanied by her husband presenting today for follow-up on anxiety/depression.  She is currently taking Wellbutrin 300 mg daily along with Cymbalta 60 mg daily.  Tolerating both medications well without side effects.  Feels that these medications are helpful for her however her recent issues with chronic pain in both feet and activity limitations are causing some worsened issues.  Finds result tearful at times and is mourning the loss of her independence and spontaneity with multiple activities.  No longer able to keep her grandchildren because of her orthopedic issues and inability to keep up physically.  Feels that management of her pain and finding an answer to her foot limitations will help greatly.  Denies SI/HI.  For now plan to continue Cymbalta and Wellbutrin as prescribed although we may need to make adjustments in the near future.  Would like to work on getting her pain management under control.  3. Irregular heartbeat On exam today, she did have irregularity in her heartbeat which was new for her.  Completely asymptomatic with an otherwise normal cardiopulmonary exam.  In office EKG performed showing normal sinus rhythm with PVCs, rate 94, normal axis.  PVCs are infrequent so recommend monitoring for any dizziness, chest pain, shortness of breath, lower extremity edema, palpitations, unusual headaches, or vision changes.  4. Chronic pain of both feet She is working with an orthopedist regarding the pain in her feet however she reports he does not prescribe pain medications to manage her issues.  Does have plans in place for further interventions and hopes to find a way to control her symptoms as she is not a candidate for surgery.  She is currently taking pregabalin 75 mg 3 times daily, tolerating well without  side effects.  Unfortunately it does not seem to be doing much for her pain.  We did discuss the risks of controlling pain with narcotic medications on a long-term basis.  Would like to hold off on this as long as possible however I would be happy to refer her to a pain clinic should she decide that she would like to go that route.  She is already on a bit of Cymbalta in addition to the Lyrica but feel that we have room for dose changes.  She has tried gabapentin which was not helpful.  Plan to increase Lyrica to 150 mg in the morning in the evening with a 75 mg afternoon dose over the next 2 to 3 weeks.  Plan to reevaluate at that time to see if she is tolerating it, it is helpful, or if we need to make any changes.  5. Hypertension, unspecified type She does have a history of hypertension and her blood pressure was elevated on arrival today.  Recheck of her blood pressure was 130/80 at the end of her appointment.  Recommend that she work on anxiety/stress reduction, pain management efforts, and maintain a low sodium diet.  Not currently on any medications for blood pressure management but this may need to be upper in the future.  6. Gastroesophageal reflux disease, unspecified whether esophagitis present Taking omeprazole 40 mg daily, tolerating well without side effects.  Has been on this long-term and feels that it keeps her symptoms stable.  Discussed recommendations for limitation of PPIs however in the setting of NSAID use, feel that the benefits outweigh the  risks.  Continue omeprazole daily as needed.  7. Mild intermittent asthma without complication Doing well on Allegra 180 mg daily with a as needed albuterol inhaler.  Plan to continue this regimen as there have been no recent exacerbations or current concerns.  8. Skin lesion Has had multiple issues with skin lesions in the past and has undergone multiple biopsies and removals.  Today reports that she has a spot on the upper right breast that  is irritated, itchy, and bothersome.  Would like to have this treated with cryotherapy today.  See below for procedure note.  Physical Exam Vitals reviewed.  Constitutional:      General: She is not in acute distress.    Appearance: Normal appearance. She is obese. She is not ill-appearing.  HENT:     Head: Normocephalic and atraumatic.  Cardiovascular:     Rate and Rhythm: Normal rate and regular rhythm.     Pulses: Normal pulses.     Heart sounds: Normal heart sounds. No murmur heard.    No friction rub. No gallop.  Pulmonary:     Effort: Pulmonary effort is normal. No respiratory distress.     Breath sounds: Normal breath sounds. No wheezing.  Musculoskeletal:     Right foot: Decreased range of motion. Swelling, tenderness and bony tenderness present.     Left foot: Decreased range of motion. Tenderness and bony tenderness present.  Skin:    General: Skin is warm and dry.     Findings: Lesion (3mm flesh toned skin lesion to the right upper breast) present.  Neurological:     Mental Status: She is alert and oriented to person, place, and time.     Gait: Gait abnormal.  Psychiatric:        Mood and Affect: Mood normal. Affect is tearful.        Speech: Speech normal.        Behavior: Behavior normal.        Thought Content: Thought content normal.        Judgment: Judgment normal.    Procedures performed this visit: Cryotherapy template Procedure: Cryodestruction of: Skin lesion on right upper breast Consent obtained and verified. Time-out conducted. Noted no overlying erythema, induration, or other signs of local infection. Completed without difficulty using Cryo-Gun. Advised to call if fevers/chills, erythema, induration, drainage, or persistent bleeding.  Return in about 3 weeks (around 12/06/2022) for Chronic pain follow-up (okay to be virtual).  __________________________________ Thayer Ohm, DNP, APRN, FNP-BC Primary Care and Sports Medicine [facility] [place]

## 2022-11-21 ENCOUNTER — Other Ambulatory Visit: Payer: Self-pay | Admitting: Medical-Surgical

## 2022-11-21 DIAGNOSIS — F419 Anxiety disorder, unspecified: Secondary | ICD-10-CM

## 2022-11-21 DIAGNOSIS — Z780 Asymptomatic menopausal state: Secondary | ICD-10-CM

## 2022-11-22 ENCOUNTER — Other Ambulatory Visit: Payer: Self-pay | Admitting: Medical-Surgical

## 2022-11-22 ENCOUNTER — Ambulatory Visit (INDEPENDENT_AMBULATORY_CARE_PROVIDER_SITE_OTHER): Payer: PPO

## 2022-11-22 ENCOUNTER — Encounter: Payer: Self-pay | Admitting: Medical-Surgical

## 2022-11-22 DIAGNOSIS — Z0000 Encounter for general adult medical examination without abnormal findings: Secondary | ICD-10-CM

## 2022-11-22 DIAGNOSIS — Z780 Asymptomatic menopausal state: Secondary | ICD-10-CM | POA: Diagnosis not present

## 2022-11-22 DIAGNOSIS — M19071 Primary osteoarthritis, right ankle and foot: Secondary | ICD-10-CM

## 2022-11-22 DIAGNOSIS — M85851 Other specified disorders of bone density and structure, right thigh: Secondary | ICD-10-CM | POA: Diagnosis not present

## 2022-11-22 DIAGNOSIS — M519 Unspecified thoracic, thoracolumbar and lumbosacral intervertebral disc disorder: Secondary | ICD-10-CM

## 2022-11-22 DIAGNOSIS — M5090 Cervical disc disorder, unspecified, unspecified cervical region: Secondary | ICD-10-CM

## 2022-11-27 NOTE — Addendum Note (Signed)
Addended by: Chalmers Cater on: 11/27/2022 10:17 AM   Modules accepted: Orders

## 2022-11-29 NOTE — Telephone Encounter (Signed)
Is there anything else I need to do to facilitate getting her in with pain management?

## 2022-12-01 ENCOUNTER — Other Ambulatory Visit: Payer: Self-pay | Admitting: Medical-Surgical

## 2022-12-01 DIAGNOSIS — Z780 Asymptomatic menopausal state: Secondary | ICD-10-CM

## 2022-12-01 DIAGNOSIS — F419 Anxiety disorder, unspecified: Secondary | ICD-10-CM

## 2022-12-02 ENCOUNTER — Other Ambulatory Visit: Payer: Self-pay | Admitting: Physician Assistant

## 2022-12-08 DIAGNOSIS — Z5181 Encounter for therapeutic drug level monitoring: Secondary | ICD-10-CM | POA: Diagnosis not present

## 2022-12-08 DIAGNOSIS — M79672 Pain in left foot: Secondary | ICD-10-CM | POA: Diagnosis not present

## 2022-12-08 DIAGNOSIS — M79671 Pain in right foot: Secondary | ICD-10-CM | POA: Diagnosis not present

## 2022-12-08 DIAGNOSIS — Z79899 Other long term (current) drug therapy: Secondary | ICD-10-CM | POA: Diagnosis not present

## 2022-12-08 DIAGNOSIS — M545 Low back pain, unspecified: Secondary | ICD-10-CM | POA: Diagnosis not present

## 2022-12-19 ENCOUNTER — Other Ambulatory Visit: Payer: Self-pay | Admitting: Medical-Surgical

## 2022-12-26 DIAGNOSIS — M19071 Primary osteoarthritis, right ankle and foot: Secondary | ICD-10-CM | POA: Diagnosis not present

## 2023-01-05 DIAGNOSIS — M79672 Pain in left foot: Secondary | ICD-10-CM | POA: Diagnosis not present

## 2023-01-05 DIAGNOSIS — M545 Low back pain, unspecified: Secondary | ICD-10-CM | POA: Diagnosis not present

## 2023-01-05 DIAGNOSIS — M79671 Pain in right foot: Secondary | ICD-10-CM | POA: Diagnosis not present

## 2023-01-05 DIAGNOSIS — G8929 Other chronic pain: Secondary | ICD-10-CM | POA: Diagnosis not present

## 2023-01-15 DIAGNOSIS — M19071 Primary osteoarthritis, right ankle and foot: Secondary | ICD-10-CM | POA: Diagnosis not present

## 2023-01-17 ENCOUNTER — Other Ambulatory Visit: Payer: Self-pay | Admitting: Medical-Surgical

## 2023-02-05 ENCOUNTER — Encounter: Payer: Self-pay | Admitting: Medical-Surgical

## 2023-02-05 ENCOUNTER — Other Ambulatory Visit: Payer: Self-pay | Admitting: Medical-Surgical

## 2023-02-05 NOTE — Telephone Encounter (Signed)
Patient wanted plan to follow-up with me in approximately 3 months for chronic disease follow-up.  Please contact her to facilitate scheduling.

## 2023-02-06 ENCOUNTER — Encounter: Payer: Self-pay | Admitting: Medical-Surgical

## 2023-02-07 NOTE — Telephone Encounter (Signed)
Patient has scheduled an appointment to discuss

## 2023-02-12 DIAGNOSIS — M19072 Primary osteoarthritis, left ankle and foot: Secondary | ICD-10-CM | POA: Diagnosis not present

## 2023-02-12 DIAGNOSIS — M19071 Primary osteoarthritis, right ankle and foot: Secondary | ICD-10-CM | POA: Diagnosis not present

## 2023-02-14 NOTE — Telephone Encounter (Signed)
Attempted call to patient to assist with scheduling . Left a voice mail message requesting a return call.

## 2023-02-15 ENCOUNTER — Ambulatory Visit (INDEPENDENT_AMBULATORY_CARE_PROVIDER_SITE_OTHER): Payer: PPO | Admitting: Medical-Surgical

## 2023-02-15 ENCOUNTER — Encounter: Payer: Self-pay | Admitting: Medical-Surgical

## 2023-02-15 VITALS — BP 136/89 | HR 73 | Resp 20 | Ht 63.0 in | Wt 217.9 lb

## 2023-02-15 DIAGNOSIS — G8929 Other chronic pain: Secondary | ICD-10-CM | POA: Diagnosis not present

## 2023-02-15 DIAGNOSIS — I10 Essential (primary) hypertension: Secondary | ICD-10-CM

## 2023-02-15 MED ORDER — LISINOPRIL 5 MG PO TABS: 5.0000 mg | ORAL_TABLET | Freq: Every day | ORAL | 3 refills | Status: DC

## 2023-02-15 MED ORDER — MELOXICAM 15 MG PO TABS: 15.0000 mg | ORAL_TABLET | Freq: Every day | ORAL | 3 refills | Status: DC

## 2023-02-15 MED ORDER — PREGABALIN 150 MG PO CAPS: 150.0000 mg | ORAL_CAPSULE | Freq: Three times a day (TID) | ORAL | 1 refills | Status: DC

## 2023-02-15 NOTE — Progress Notes (Signed)
        Established patient visit  History, exam, impression, and plan:  1. Other chronic pain Pleasant 67 year old female presenting today to discuss continued chronic pain.  She has had multiple locations identified with arthritis and degeneration.  She was previously taking Lyrica and meloxicam which was somewhat helpful however she had significant breakthrough pain.  Unfortunately her feet seem to be the worst of this and it is limiting her ability to complete daily activities.  She is also very limited in her mobility and has started gaining weight.  She was referred to a pain clinic where they put her on a couple of different medicines including topiramate but she had significant intolerances to these.  She decided not to go back to the pain clinic and would like to resume meloxicam and Lyrica since this was helpful before.  These are well-tolerated and there are no concerns on her lab work.  Restarting meloxicam 15 mg daily.  Increasing Lyrica to 150 mg 3 times daily for ease of dosing.  Plan to follow-up in 3 months. - meloxicam (MOBIC) 15 MG tablet; Take 1 tablet (15 mg total) by mouth daily.  Dispense: 90 tablet; Refill: 3 - pregabalin (LYRICA) 150 MG capsule; Take 1 capsule (150 mg total) by mouth in the morning, at noon, and at bedtime.  Dispense: 270 capsule; Refill: 1  2. Primary hypertension History of primary hypertension however she has not been on medication after losing weight.  Unfortunately, she has now started gaining weight again and her blood pressure started to creep up.  She is open to restarting medication.'s restarting lisinopril 5 mg daily.  Monitor blood pressure at home with a goal of 130/80 or less over the next couple of weeks then return for nurse visit for blood pressure check.  Denies concerning symptoms today.  Cardiopulmonary exam is benign.  Procedures performed this visit: None.  Return in about 2 weeks (around 03/01/2023) for nurse visit for BP  check.  __________________________________ Thayer Ohm, DNP, APRN, FNP-BC Primary Care and Sports Medicine [facility] [place]

## 2023-02-22 DIAGNOSIS — H353132 Nonexudative age-related macular degeneration, bilateral, intermediate dry stage: Secondary | ICD-10-CM | POA: Diagnosis not present

## 2023-02-22 DIAGNOSIS — H2513 Age-related nuclear cataract, bilateral: Secondary | ICD-10-CM | POA: Diagnosis not present

## 2023-03-01 ENCOUNTER — Ambulatory Visit: Payer: PPO

## 2023-03-01 ENCOUNTER — Ambulatory Visit (INDEPENDENT_AMBULATORY_CARE_PROVIDER_SITE_OTHER): Payer: PPO

## 2023-03-01 VITALS — BP 138/78 | HR 85

## 2023-03-01 DIAGNOSIS — I10 Essential (primary) hypertension: Secondary | ICD-10-CM | POA: Diagnosis not present

## 2023-03-01 MED ORDER — LISINOPRIL 10 MG PO TABS: 10.0000 mg | ORAL_TABLET | Freq: Every day | ORAL | 1 refills | Status: DC

## 2023-03-01 NOTE — Progress Notes (Signed)
Established Patient Office Visit  Subjective   Patient ID: [name], female    DOB: 1955/12/04  Age: 67 y.o. MRN: [id number]  Chief Complaint  Patient presents with   Hypertension    HPI  [name] is here for blood pressure check. Denies chest pain, shortness of breath or dizziness.   ROS    Objective:     BP 138/78   Pulse 85   SpO2 98%    Physical Exam   No results found for any visits on 03/01/23.    The 10-year ASCVD risk score (Arnett DK, et al., 2019) is: 9.1%    Assessment & Plan:  HTN - Blood pressure still not < 130/80. Patient advised to take lisinopril 10 mg daily.   Problem List Items Addressed This Visit       Unprioritized   Hypertension - Primary   Relevant Medications   lisinopril (ZESTRIL) 10 MG tablet    Return in about 2 weeks (around 03/15/2023) for blood pressure check on the nurse schedule. Earna Coder, Janalyn Harder, CMA

## 2023-03-04 ENCOUNTER — Other Ambulatory Visit: Payer: Self-pay | Admitting: Medical-Surgical

## 2023-03-04 DIAGNOSIS — K219 Gastro-esophageal reflux disease without esophagitis: Secondary | ICD-10-CM

## 2023-03-15 ENCOUNTER — Ambulatory Visit: Payer: PPO

## 2023-03-23 ENCOUNTER — Ambulatory Visit: Payer: PPO

## 2023-03-23 ENCOUNTER — Ambulatory Visit
Admission: EM | Admit: 2023-03-23 | Discharge: 2023-03-23 | Disposition: A | Discharge status: Home / Self Care | Payer: PPO | Attending: Family Medicine | Admitting: Family Medicine

## 2023-03-23 ENCOUNTER — Encounter: Payer: Self-pay | Admitting: Emergency Medicine

## 2023-03-23 DIAGNOSIS — J9811 Atelectasis: Secondary | ICD-10-CM

## 2023-03-23 DIAGNOSIS — R059 Cough, unspecified: Secondary | ICD-10-CM

## 2023-03-23 DIAGNOSIS — R918 Other nonspecific abnormal finding of lung field: Secondary | ICD-10-CM | POA: Diagnosis not present

## 2023-03-23 DIAGNOSIS — J189 Pneumonia, unspecified organism: Secondary | ICD-10-CM | POA: Diagnosis not present

## 2023-03-23 DIAGNOSIS — R0602 Shortness of breath: Secondary | ICD-10-CM

## 2023-03-23 LAB — POCT INFLUENZA A/B
Influenza A, POC: NEGATIVE
Influenza B, POC: NEGATIVE

## 2023-03-23 LAB — POC SARS CORONAVIRUS 2 AG -  ED: SARS Coronavirus 2 Ag: NEGATIVE

## 2023-03-23 MED ORDER — AZITHROMYCIN 250 MG PO TABS: 250.0000 mg | ORAL_TABLET | Freq: Every day | ORAL | 0 refills | Status: DC

## 2023-03-23 MED ORDER — AMOXICILLIN-POT CLAVULANATE 875-125 MG PO TABS: 1.0000 | ORAL_TABLET | Freq: Two times a day (BID) | ORAL | 0 refills | Status: AC

## 2023-03-23 MED ORDER — HYDROCODONE BIT-HOMATROP MBR 5-1.5 MG/5ML PO SOLN: 5.0000 mL | Freq: Four times a day (QID) | ORAL | 0 refills | Status: DC | PRN

## 2023-03-23 NOTE — ED Triage Notes (Signed)
Patient c/o SOB, feeling tired, cough x 3 days.  Patient has taken Tylenol.  Patient states she just doesn't feel well.

## 2023-03-23 NOTE — Discharge Instructions (Addendum)
Advised patient of chest x-ray results with hardcopy provided.  Advised patient to take medications as directed with food to completion.  Advised may use Hycodan daily or as needed for cough.  Patient advised of sedative effects.  Courage patient to increase daily water intake to 64 ounces per day while taking these medications.  Advised patient to repeat CXR on or about 04/22/2023 to ensure left lower lobe pneumonia has completely resolved.  Advised if symptoms worsen and/or unresolved please follow-up with PCP or here for further evaluation.

## 2023-03-23 NOTE — ED Provider Notes (Signed)
[facility]    CSN: [id number] Arrival date & time: 03/23/23  1429      History   Chief Complaint Chief Complaint  Patient presents with   Shortness of Breath    HPI [name] is a 67 y.o. female.   HPI 67 year old female presents with shortness of breath, fatigue and cough for 3 days.  PMH significant for obesity, BCC, and HTN.  Patient is accompanied by her son today.  Past Medical History:  Diagnosis Date   Anxiety    Asthma 02/21/2018   Back pain    Cancer (HCC)    Depression    GERD (gastroesophageal reflux disease)    Hypertension     Patient Active Problem List   Diagnosis Date Noted   Osteoarthritis of both ankles and feet 11/22/2022   At risk for obstructive sleep apnea 03/26/2020   History of pulmonary embolus (PE) 03/26/2020   Basal cell carcinoma (BCC) of right ala nasi 02/21/2019   Basal cell carcinoma (BCC) of right upper eyelid 02/07/2019   Dysphagia 10/30/2018   History of esophageal dilatation 10/30/2018   Chronic right shoulder pain 10/15/2018   Asthma 02/21/2018   Paralyzed hemidiaphragm 02/21/2018   Elevated hemidiaphragm 02/05/2018   Atelectasis 02/05/2018   Anxiety 02/01/2018   Cervical neck pain with evidence of disc disease 02/01/2018   BMI 38.0-38.9,adult 02/01/2018   Cataract 04/01/2015   Depression 04/01/2015   Hyperlipidemia 04/01/2015   Hypertension 04/01/2015   Disorder of intervertebral disc of cervical spine 04/01/2015   Lumbar disc disease 04/01/2015   Fatty (change of) liver, not elsewhere classified 05/29/2012    Past Surgical History:  Procedure Laterality Date   ABDOMINAL HYSTERECTOMY     ARTHROSCOPY WITH ANTERIOR CRUCIATE LIGAMENT (ACL) REPAIR WITH ANTERIOR TIBILIAS GRAFT     BACK SURGERY     EXCISION MASS ABDOMINAL Left 11/12/2020   Procedure: EXCISION LEFT MASS ABDOMINAL;  Surgeon: Sheliah Hatch, De Blanch, MD;  Location: [facility];  Service: General;  Laterality: Left;   FOOT SURGERY Left    SHOULDER  ARTHROSCOPY W/ ROTATOR CUFF REPAIR     SHOULDER OPEN ROTATOR CUFF REPAIR     TUBAL LIGATION      OB History   No obstetric history on file.      Home Medications    Prior to Admission medications   Medication Sig Start Date End Date Taking? Authorizing Provider  albuterol (VENTOLIN HFA) 108 (90 Base) MCG/ACT inhaler Inhale 2 puffs into the lungs every 6 (six) hours as needed for wheezing or shortness of breath. 04/26/22  Yes Christen Butter, NP  amoxicillin-clavulanate (AUGMENTIN) 875-125 MG tablet Take 1 tablet by mouth 2 (two) times daily for 10 days. 03/23/23 04/02/23 Yes Trevor Iha, FNP  azithromycin (ZITHROMAX) 250 MG tablet Take 1 tablet (250 mg total) by mouth daily. Take first 2 tablets together, then 1 every day until finished. 03/23/23  Yes Trevor Iha, FNP  buPROPion (WELLBUTRIN XL) 300 MG 24 hr tablet TAKE 1 TABLET BY MOUTH EVERY DAY 12/01/22  Yes Christen Butter, NP  Cholecalciferol (VITAMIN D) 125 MCG (5000 UT) CAPS Take 5,000 Units by mouth daily.   Yes [provider]  DULoxetine (CYMBALTA) 60 MG capsule TAKE 1 CAPSULE BY MOUTH 2 TIMES DAILY. 11/24/22  Yes Christen Butter, NP  famotidine (PEPCID) 40 MG tablet TAKE 1 TABLET BY MOUTH EVERYDAY AT BEDTIME 12/04/22  Yes Quentin Mulling R, PA-C  HYDROcodone bit-homatropine (HYCODAN) 5-1.5 MG/5ML syrup Take 5 mLs by mouth every  6 (six) hours as needed for cough. 03/23/23  Yes Trevor Iha, FNP  lisinopril (ZESTRIL) 10 MG tablet Take 1 tablet (10 mg total) by mouth daily. 03/01/23  Yes Christen Butter, NP  meloxicam (MOBIC) 15 MG tablet Take 1 tablet (15 mg total) by mouth daily. 02/15/23  Yes Christen Butter, NP  mupirocin ointment (BACTROBAN) 2 % Apply 1 Application topically 2 (two) times daily as needed. 04/26/22  Yes Christen Butter, NP  omeprazole (PRILOSEC) 40 MG capsule TAKE 1 CAPSULE BY MOUTH EVERY DAY IN THE MORNING AND AT BEDTIME 03/08/23  Yes Jessup, Joy, NP  pregabalin (LYRICA) 150 MG capsule Take 1 capsule (150 mg total) by mouth in  the morning, at noon, and at bedtime. 02/15/23  Yes Christen Butter, NP  vitamin E 1000 UNIT capsule Take 1,000 Units by mouth daily.   Yes [provider]  fexofenadine (ALLEGRA ALLERGY) 180 MG tablet Take 1 tablet (180 mg total) by mouth daily for 15 days. 05/29/22 10/24/22  Trevor Iha, FNP    Family History Family History  Problem Relation Age of Onset   Cancer Mother        lung   Cancer Father        lung   Hypertension Father    Diabetes Father    Breast cancer Sister 40   Breast cancer Maternal Aunt    Colon cancer Maternal Aunt    Esophageal cancer Maternal Uncle    Breast cancer Cousin    Non-Hodgkin's lymphoma Son    Rectal cancer Neg Hx    Stomach cancer Neg Hx     Social History Social History   Tobacco Use   Smoking status: Never    Passive exposure: Yes   Smokeless tobacco: Never  Vaping Use   Vaping status: Never Used  Substance Use Topics   Alcohol use: Yes    Comment: occasionally   Drug use: Never     Allergies   Nabumetone and Topiramate   Review of Systems Review of Systems  Respiratory:  Positive for cough and shortness of breath.   All other systems reviewed and are negative.    Physical Exam Triage Vital Signs ED Triage Vitals  Encounter Vitals Group     BP      Systolic BP Percentile      Diastolic BP Percentile      Pulse      Resp      Temp      Temp src      SpO2      Weight      Height      Head Circumference      Peak Flow      Pain Score      Pain Loc      Pain Education      Exclude from Growth Chart    No data found.  Updated Vital Signs BP (!) 151/108 (BP Location: Right Arm)   Temp 98.5 F (36.9 C) (Oral)   Resp 20   SpO2 97%    Physical Exam Vitals and nursing note reviewed.  Constitutional:      Appearance: Normal appearance. She is normal weight.  HENT:     Head: Normocephalic and atraumatic.     Right Ear: Tympanic membrane, ear canal and external ear normal.     Left Ear: Tympanic  membrane, ear canal and external ear normal.     Mouth/Throat:     Mouth: Mucous membranes are moist.  Pharynx: Oropharynx is clear.  Eyes:     Extraocular Movements: Extraocular movements intact.     Conjunctiva/sclera: Conjunctivae normal.     Pupils: Pupils are equal, round, and reactive to light.  Cardiovascular:     Rate and Rhythm: Normal rate and regular rhythm.     Pulses: Normal pulses.     Heart sounds: Normal heart sounds.  Pulmonary:     Effort: Pulmonary effort is normal.     Breath sounds: Rales present. No wheezing or rhonchi.     Comments: Subtle fine rales and crackles noted over bilateral bases diminished breath sounds noted throughout with infrequent nonproductive cough Musculoskeletal:        General: Normal range of motion.     Cervical back: Normal range of motion and neck supple.  Skin:    General: Skin is warm and dry.  Neurological:     General: No focal deficit present.     Mental Status: She is alert and oriented to person, place, and time. Mental status is at baseline.      UC Treatments / Results  Labs (all labs ordered are listed, but only abnormal results are displayed) Labs Reviewed  POCT INFLUENZA A/B  POC SARS CORONAVIRUS 2 AG -  ED    EKG   Radiology DG Chest 2 View  Result Date: 03/23/2023 CLINICAL DATA:  Cough, shortness of breath. EXAM: CHEST - 2 VIEW COMPARISON:  July 10, 2022. FINDINGS: Stable cardiomediastinal silhouette. Stable elevated right hemidiaphragm with minimal associated subsegmental atelectasis. Slightly increased left basilar opacity is noted concerning for pneumonia or atelectasis. Bony thorax is unremarkable. IMPRESSION: Stable elevated right hemidiaphragm with minimal associated rib subsegmental atelectasis. Slightly increased left basilar opacity is noted concerning for possible pneumonia or atelectasis. Followup PA and lateral chest X-ray is recommended in 3-4 weeks following trial of antibiotic therapy to  ensure resolution and exclude underlying malignancy. Electronically Signed   By: Lupita Raider M.D.   On: 03/23/2023 16:53    Procedures Procedures (including critical care time)  Medications Ordered in UC Medications - No data to display  Initial Impression / Assessment and Plan / UC Course  I have reviewed the triage vital signs and the nursing notes.  Pertinent labs & imaging results that were available during my care of the patient were reviewed by me and considered in my medical decision making (see chart for details).     MDM: 1.  Community-acquired pneumonia of left lower lobe of lung-CXR revealed above, Rx'd Zithromax 500 mg day 1 then 250 mg day 2-5, Augmentin 875/125 mg tablet: Take 1 tablet twice daily x 10 days; 2.  Shortness of breath-CXR revealed above; 3.  Cough, unspecified type-Rx'd Hycodan 5-1.5 mg/5 mL syrup: Take 5 mL every 6 hours for cough as needed. Advised patient of chest x-ray results with hardcopy provided.  Advised patient to take medications as directed with food to completion.  Advised may use Hycodan daily or as needed for cough.  Patient advised of sedative effects.  Courage patient to increase daily water intake to 64 ounces per day while taking these medications.  Advised patient to repeat CXR on or about 04/22/2023 to ensure left lower lobe pneumonia has completely resolved.  Advised if symptoms worsen and/or unresolved please follow-up with PCP or here for further evaluation.  Patient discharged home, hemodynamically stable. Final Clinical Impressions(s) / UC Diagnoses   Final diagnoses:  SOB (shortness of breath)  Community acquired pneumonia of left lower lobe of  lung  Cough, unspecified type     Discharge Instructions      Advised patient of chest x-ray results with hardcopy provided.  Advised patient to take medications as directed with food to completion.  Advised may use Hycodan daily or as needed for cough.  Patient advised of sedative effects.   Courage patient to increase daily water intake to 64 ounces per day while taking these medications.  Advised patient to repeat CXR on or about 04/22/2023 to ensure left lower lobe pneumonia has completely resolved.  Advised if symptoms worsen and/or unresolved please follow-up with PCP or here for further evaluation.     ED Prescriptions     Medication Sig Dispense Auth. Provider   amoxicillin-clavulanate (AUGMENTIN) 875-125 MG tablet Take 1 tablet by mouth 2 (two) times daily for 10 days. 20 tablet Trevor Iha, FNP   azithromycin (ZITHROMAX) 250 MG tablet Take 1 tablet (250 mg total) by mouth daily. Take first 2 tablets together, then 1 every day until finished. 6 tablet Trevor Iha, FNP   HYDROcodone bit-homatropine (HYCODAN) 5-1.5 MG/5ML syrup Take 5 mLs by mouth every 6 (six) hours as needed for cough. 120 mL Trevor Iha, FNP      I have reviewed the PDMP during this encounter.   Trevor Iha, FNP 03/23/23 1719

## 2023-03-28 ENCOUNTER — Other Ambulatory Visit: Payer: Self-pay | Admitting: Medical-Surgical

## 2023-04-01 ENCOUNTER — Encounter: Payer: Self-pay | Admitting: Medical-Surgical

## 2023-04-03 ENCOUNTER — Encounter: Payer: Self-pay | Admitting: Medical-Surgical

## 2023-04-03 ENCOUNTER — Ambulatory Visit (INDEPENDENT_AMBULATORY_CARE_PROVIDER_SITE_OTHER): Payer: PPO | Admitting: Medical-Surgical

## 2023-04-03 VITALS — BP 115/79 | HR 94 | Resp 20 | Ht 63.0 in | Wt 213.1 lb

## 2023-04-03 DIAGNOSIS — I10 Essential (primary) hypertension: Secondary | ICD-10-CM

## 2023-04-03 DIAGNOSIS — J189 Pneumonia, unspecified organism: Secondary | ICD-10-CM

## 2023-04-03 NOTE — Progress Notes (Signed)
        Established patient visit  History, exam, impression, and plan:  1. Hypertension, unspecified type Very pleasant 67 year old female presenting today with a history of hypertension that is currently being treated with lisinopril 10 mg daily.  Tolerating the medication well without side effects.  Has a blood pressure machine at home but only checks it every now and then.  Readings have reportedly been good with systolics in the 130s.  Blood pressure at her recent urgent care visit was significantly elevated at 151/108.  Today she is elevated on arrival however recheck is at 115/79.  Checking labs as below.  Continue lisinopril 10 mg daily as prescribed. - CBC with Differential/Platelet - CMP14+EGFR - Lipid panel  2. Community acquired pneumonia of left lower lobe of lung On review of records, she experienced some upper respiratory symptoms that rapidly progressed to significant fatigue, dizziness, weakness, shortness of breath, and productive cough.  She was evaluated at urgent care on 03/23/2023 where she was diagnosed with community-acquired pneumonia of the left lower lobe.  She was discharged from urgent care with Augmentin twice daily x 10 days and azithromycin for a 5-day course.  She was provided with prescription cough syrup.  Today she reports that she continues to feel very fatigued and wants to sleep all the time.  She is eating and drinking normally and has been afebrile.  Not doing any regular deep breathing exercises and has been mostly sedentary.  Recommend repeat chest x-ray in 3-4 weeks.  Expect to have some generalized malaise and fatigue during her recovery.  Incentive spirometer provided to help with deep breathing exercises.  Checking labs today as noted above. - DG Chest 2 View; Future  Review of Systems  Constitutional:  Positive for malaise/fatigue. Negative for chills and fever.  Eyes:  Negative for blurred vision.  Respiratory:  Positive for cough and shortness of  breath.   Cardiovascular:  Negative for chest pain, palpitations and leg swelling.  Neurological:  Negative for dizziness and headaches.    Physical Exam Vitals reviewed.  Constitutional:      General: She is not in acute distress.    Appearance: Normal appearance. She is obese. She is not ill-appearing.  HENT:     Head: Normocephalic and atraumatic.  Cardiovascular:     Rate and Rhythm: Normal rate and regular rhythm.     Pulses: Normal pulses.     Heart sounds: Normal heart sounds. No murmur heard.    No friction rub. No gallop.  Pulmonary:     Effort: Pulmonary effort is normal. No respiratory distress.     Breath sounds: Normal breath sounds. No wheezing.  Skin:    General: Skin is warm and dry.  Neurological:     Mental Status: She is alert and oriented to person, place, and time.  Psychiatric:        Mood and Affect: Mood normal.        Behavior: Behavior normal.        Thought Content: Thought content normal.        Judgment: Judgment normal.   Procedures performed this visit: None.  Return in about 3 months (around 07/04/2023) for HTN follow up or sooner if needed.  __________________________________ Thayer Ohm, DNP, APRN, FNP-BC Primary Care and Sports Medicine [facility] [place]

## 2023-04-04 ENCOUNTER — Encounter: Payer: Self-pay | Admitting: Medical-Surgical

## 2023-04-04 DIAGNOSIS — E782 Mixed hyperlipidemia: Secondary | ICD-10-CM

## 2023-04-04 LAB — CBC WITH DIFFERENTIAL/PLATELET
Basophils Absolute: 0 10*3/uL (ref 0.0–0.2)
Basos: 1 %
EOS (ABSOLUTE): 0.2 10*3/uL (ref 0.0–0.4)
Eos: 3 %
Hematocrit: 44.6 % (ref 34.0–46.6)
Hemoglobin: 14.6 g/dL (ref 11.1–15.9)
Immature Grans (Abs): 0 10*3/uL (ref 0.0–0.1)
Immature Granulocytes: 1 %
Lymphocytes Absolute: 2.3 10*3/uL (ref 0.7–3.1)
Lymphs: 42 %
MCH: 32.3 pg (ref 26.6–33.0)
MCHC: 32.7 g/dL (ref 31.5–35.7)
MCV: 99 fL — ABNORMAL HIGH (ref 79–97)
Monocytes Absolute: 0.4 10*3/uL (ref 0.1–0.9)
Monocytes: 7 %
Neutrophils Absolute: 2.6 10*3/uL (ref 1.4–7.0)
Neutrophils: 46 %
Platelets: 396 10*3/uL (ref 150–450)
RBC: 4.52 x10E6/uL (ref 3.77–5.28)
RDW: 12.3 % (ref 11.7–15.4)
WBC: 5.5 10*3/uL (ref 3.4–10.8)

## 2023-04-04 LAB — CMP14+EGFR
ALT: 27 [IU]/L (ref 0–32)
AST: 32 [IU]/L (ref 0–40)
Albumin: 3.6 g/dL — ABNORMAL LOW (ref 3.9–4.9)
Alkaline Phosphatase: 94 [IU]/L (ref 44–121)
BUN/Creatinine Ratio: 16 (ref 12–28)
BUN: 16 mg/dL (ref 8–27)
Bilirubin Total: 0.3 mg/dL (ref 0.0–1.2)
CO2: 28 mmol/L (ref 20–29)
Calcium: 9.1 mg/dL (ref 8.7–10.3)
Chloride: 101 mmol/L (ref 96–106)
Creatinine, Ser: 1 mg/dL (ref 0.57–1.00)
Globulin, Total: 2.7 g/dL (ref 1.5–4.5)
Glucose: 91 mg/dL (ref 70–99)
Potassium: 5.2 mmol/L (ref 3.5–5.2)
Sodium: 142 mmol/L (ref 134–144)
Total Protein: 6.3 g/dL (ref 6.0–8.5)
eGFR: 62 mL/min/{1.73_m2} (ref >59)

## 2023-04-04 LAB — LIPID PANEL
Chol/HDL Ratio: 4.7 ratio — ABNORMAL HIGH (ref 0.0–4.4)
Cholesterol, Total: 231 mg/dL — ABNORMAL HIGH (ref 100–199)
HDL: 49 mg/dL (ref >39)
LDL Chol Calc (NIH): 155 mg/dL — ABNORMAL HIGH (ref 0–99)
Triglycerides: 149 mg/dL (ref 0–149)
VLDL Cholesterol Cal: 27 mg/dL (ref 5–40)

## 2023-04-04 MED ORDER — ATORVASTATIN CALCIUM 10 MG PO TABS: 10.0000 mg | ORAL_TABLET | Freq: Every day | ORAL | 3 refills | Status: DC

## 2023-04-10 DIAGNOSIS — H353132 Nonexudative age-related macular degeneration, bilateral, intermediate dry stage: Secondary | ICD-10-CM | POA: Diagnosis not present

## 2023-04-23 ENCOUNTER — Ambulatory Visit: Payer: PPO

## 2023-04-23 ENCOUNTER — Ambulatory Visit
Admission: EM | Admit: 2023-04-23 | Discharge: 2023-04-23 | Disposition: A | Discharge status: Home / Self Care | Payer: PPO | Attending: Family Medicine | Admitting: Family Medicine

## 2023-04-23 DIAGNOSIS — J189 Pneumonia, unspecified organism: Secondary | ICD-10-CM

## 2023-04-23 DIAGNOSIS — J984 Other disorders of lung: Secondary | ICD-10-CM | POA: Diagnosis not present

## 2023-04-23 DIAGNOSIS — R9389 Abnormal findings on diagnostic imaging of other specified body structures: Secondary | ICD-10-CM | POA: Diagnosis not present

## 2023-04-23 DIAGNOSIS — R058 Other specified cough: Secondary | ICD-10-CM

## 2023-04-23 MED ORDER — HYDROCOD POLI-CHLORPHE POLI ER 10-8 MG/5ML PO SUER
5.0000 mL | Freq: Two times a day (BID) | ORAL | 0 refills | Status: DC | PRN
Start: 2023-04-23 — End: 2023-07-19

## 2023-04-23 MED ORDER — PREDNISONE 20 MG PO TABS: ORAL_TABLET | ORAL | 0 refills | Status: DC

## 2023-04-23 MED ORDER — ALBUTEROL SULFATE (2.5 MG/3ML) 0.083% IN NEBU
2.5000 mg | INHALATION_SOLUTION | Freq: Once | RESPIRATORY_TRACT | Status: DC
Start: 2023-04-23 — End: 2023-04-23

## 2023-04-23 MED ORDER — ALBUTEROL SULFATE HFA 108 (90 BASE) MCG/ACT IN AERS: 2.0000 | INHALATION_SPRAY | Freq: Four times a day (QID) | RESPIRATORY_TRACT | 1 refills | Status: AC | PRN

## 2023-04-23 NOTE — ED Provider Notes (Signed)
[facility]    CSN: [id number] Arrival date & time: 04/23/23  2130      History   Chief Complaint Chief Complaint  Patient presents with   Cough    HPI [name] is a 67 y.o. female.   HPI  Patient was seen here 03/23/2023 and diagnosed with acute community-acquired pneumonia.  She saw her primary care doctor on 04/03/2023.  At that time an x-ray was recommended at 4 weeks.  The x-ray was done this morning, and since patient was in the building she decided to come up for a follow-up visit.  She is still feeling bad.  She still coughing.  She still very tired.  She still feels congested.  She took Augmentin and a azithromycin pack as directed.  She took prednisone as directed.  She also used all of her cough medication.  Past Medical History:  Diagnosis Date   Anxiety    Asthma 02/21/2018   Back pain    Cancer (HCC)    Depression    GERD (gastroesophageal reflux disease)    Hypertension     Patient Active Problem List   Diagnosis Date Noted   Osteoarthritis of both ankles and feet 11/22/2022   At risk for obstructive sleep apnea 03/26/2020   History of pulmonary embolus (PE) 03/26/2020   Basal cell carcinoma (BCC) of right ala nasi 02/21/2019   Basal cell carcinoma (BCC) of right upper eyelid 02/07/2019   Dysphagia 10/30/2018   History of esophageal dilatation 10/30/2018   Chronic right shoulder pain 10/15/2018   Asthma 02/21/2018   Paralyzed hemidiaphragm 02/21/2018   Elevated hemidiaphragm 02/05/2018   Atelectasis 02/05/2018   Anxiety 02/01/2018   Cervical neck pain with evidence of disc disease 02/01/2018   BMI 38.0-38.9,adult 02/01/2018   Cataract 04/01/2015   Depression 04/01/2015   Hyperlipidemia 04/01/2015   Hypertension 04/01/2015   Disorder of intervertebral disc of cervical spine 04/01/2015   Lumbar disc disease 04/01/2015   Fatty (change of) liver, not elsewhere classified 05/29/2012    Past Surgical History:  Procedure Laterality  Date   ABDOMINAL HYSTERECTOMY     ARTHROSCOPY WITH ANTERIOR CRUCIATE LIGAMENT (ACL) REPAIR WITH ANTERIOR TIBILIAS GRAFT     BACK SURGERY     EXCISION MASS ABDOMINAL Left 11/12/2020   Procedure: EXCISION LEFT MASS ABDOMINAL;  Surgeon: Sheliah Hatch, De Blanch, MD;  Location: [facility];  Service: General;  Laterality: Left;   FOOT SURGERY Left    SHOULDER ARTHROSCOPY W/ ROTATOR CUFF REPAIR     SHOULDER OPEN ROTATOR CUFF REPAIR     TUBAL LIGATION      OB History   No obstetric history on file.      Home Medications    Prior to Admission medications   Medication Sig Start Date End Date Taking? Authorizing Provider  chlorpheniramine-HYDROcodone (TUSSIONEX) 10-8 MG/5ML Take 5 mLs by mouth every 12 (twelve) hours as needed for cough. 04/23/23  Yes Eustace Moore, MD  predniSONE (DELTASONE) 20 MG tablet Take 1 pill 2 times a day for 5 days then 1 pill daily for 5 days then discontinue 04/23/23  Yes Eustace Moore, MD  albuterol (VENTOLIN HFA) 108 (90 Base) MCG/ACT inhaler Inhale 2 puffs into the lungs every 6 (six) hours as needed for wheezing or shortness of breath. 04/23/23   Eustace Moore, MD  atorvastatin (LIPITOR) 10 MG tablet Take 1 tablet (10 mg total) by mouth daily. 04/04/23   Christen Butter, NP  buPROPion (WELLBUTRIN XL) 300  MG 24 hr tablet TAKE 1 TABLET BY MOUTH EVERY DAY 12/01/22   Christen Butter, NP  Cholecalciferol (VITAMIN D) 125 MCG (5000 UT) CAPS Take 5,000 Units by mouth daily.    [provider]  DULoxetine (CYMBALTA) 60 MG capsule TAKE 1 CAPSULE BY MOUTH 2 TIMES DAILY. 11/24/22   Christen Butter, NP  famotidine (PEPCID) 40 MG tablet TAKE 1 TABLET BY MOUTH EVERYDAY AT BEDTIME 12/04/22   Doree Albee, PA-C  fexofenadine [facility] ALLERGY) 180 MG tablet Take 1 tablet (180 mg total) by mouth daily for 15 days. 05/29/22 10/24/22  Trevor Iha, FNP  HYDROcodone bit-homatropine (HYCODAN) 5-1.5 MG/5ML syrup Take 5 mLs by mouth every 6 (six) hours as needed for cough. 03/23/23    Trevor Iha, FNP  lisinopril (ZESTRIL) 10 MG tablet Take 1 tablet (10 mg total) by mouth daily. 03/01/23   Christen Butter, NP  meloxicam (MOBIC) 15 MG tablet Take 1 tablet (15 mg total) by mouth daily. 02/15/23   Christen Butter, NP  mupirocin ointment (BACTROBAN) 2 % Apply 1 Application topically 2 (two) times daily as needed. 04/26/22   Christen Butter, NP  omeprazole (PRILOSEC) 40 MG capsule TAKE 1 CAPSULE BY MOUTH EVERY DAY IN THE MORNING AND AT BEDTIME 03/08/23   Christen Butter, NP  pregabalin (LYRICA) 150 MG capsule Take 1 capsule (150 mg total) by mouth in the morning, at noon, and at bedtime. 02/15/23   Christen Butter, NP  vitamin E 1000 UNIT capsule Take 1,000 Units by mouth daily.    [provider]    Family History Family History  Problem Relation Age of Onset   Cancer Mother        lung   Cancer Father        lung   Hypertension Father    Diabetes Father    Breast cancer Sister 38   Breast cancer Maternal Aunt    Colon cancer Maternal Aunt    Esophageal cancer Maternal Uncle    Breast cancer Cousin    Non-Hodgkin's lymphoma Son    Rectal cancer Neg Hx    Stomach cancer Neg Hx     Social History Social History   Tobacco Use   Smoking status: Never    Passive exposure: Yes   Smokeless tobacco: Never  Vaping Use   Vaping status: Never Used  Substance Use Topics   Alcohol use: Yes    Comment: occasionally   Drug use: Never     Allergies   Nabumetone and Topiramate   Review of Systems Review of Systems See HPI  Physical Exam Triage Vital Signs ED Triage Vitals  Encounter Vitals Group     BP --      Systolic BP Percentile --      Diastolic BP Percentile --      Pulse Rate 04/23/23 0841 91     Resp 04/23/23 0841 18     Temp 04/23/23 0841 97.7 F (36.5 C)     Temp Source 04/23/23 0841 Oral     SpO2 04/23/23 0841 97 %     Weight --      Height --      Head Circumference --      Peak Flow --      Pain Score 04/23/23 0842 0     Pain Loc --      Pain  Education --      Exclude from Growth Chart --    No data found.  Updated Vital Signs  Pulse 91   Temp 97.7 F (36.5 C) (Oral)   Resp 18   SpO2 97%      Physical Exam Constitutional:      General: She is not in acute distress.    Appearance: She is well-developed. She is obese. She is ill-appearing.     Comments: Patient appears tired  HENT:     Head: Normocephalic and atraumatic.     Right Ear: Tympanic membrane and ear canal normal.     Left Ear: Tympanic membrane and ear canal normal.     Mouth/Throat:     Mouth: Mucous membranes are moist.     Comments: Voice is hoarse Eyes:     Conjunctiva/sclera: Conjunctivae normal.     Pupils: Pupils are equal, round, and reactive to light.  Cardiovascular:     Rate and Rhythm: Normal rate and regular rhythm.     Heart sounds: Normal heart sounds.  Pulmonary:     Effort: Pulmonary effort is normal. No respiratory distress.     Breath sounds: Rhonchi present.     Comments: Few rhonchi noted centrally Abdominal:     General: There is no distension.     Palpations: Abdomen is soft.  Musculoskeletal:        General: Normal range of motion.     Cervical back: Normal range of motion.  Skin:    General: Skin is warm and dry.  Neurological:     Mental Status: She is alert.      UC Treatments / Results  Labs (all labs ordered are listed, but only abnormal results are displayed) Labs Reviewed - No data to display  EKG   Radiology DG Chest 2 View  Result Date: 04/23/2023 CLINICAL DATA:  Pneumonia follow-up EXAM: CHEST - 2 VIEW COMPARISON:  03/23/2023 FINDINGS: Elevated right diaphragm on a chronic basis, obscuring portions of the right lower lobe. There is overlying scar by prior CT. There is no edema, consolidation, effusion, or pneumothorax. Normal heart size. IMPRESSION: Chronic elevation of the right diaphragm with overlying pulmonary scar. Electronically Signed   By: Tiburcio Pea M.D.   On: 04/23/2023 08:29     Procedures Procedures (including critical care time)  Medications Ordered in UC Medications  albuterol (PROVENTIL) (2.5 MG/3ML) 0.083% nebulizer solution 2.5 mg (has no administration in time range)    Initial Impression / Assessment and Plan / UC Course  I have reviewed the triage vital signs and the nursing notes.  Pertinent labs & imaging results that were available during my care of the patient were reviewed by me and considered in my medical decision making (see chart for details).     Patient was given an albuterol treatment.  She states she felt much better.  More easily breathing without cough. Final Clinical Impressions(s) / UC Diagnoses   Final diagnoses:  Community acquired pneumonia of left lower lobe of lung  Post-viral cough syndrome     Discharge Instructions      Take the prednisone as directed.  2 pills a day for 5 days then 1 pill a day for 5 days then stop Use the inhaler as needed for shortness of breath, wheezing, and bad coughing spells Take the Tussionex at night Drink lots of fluids Please try to get more rest See your doctor if not improving in another week   ED Prescriptions     Medication Sig Dispense Auth. Provider   albuterol (VENTOLIN HFA) 108 (90 Base) MCG/ACT inhaler Inhale 2 puffs into the  lungs every 6 (six) hours as needed for wheezing or shortness of breath. 1 each Eustace Moore, MD   predniSONE (DELTASONE) 20 MG tablet Take 1 pill 2 times a day for 5 days then 1 pill daily for 5 days then discontinue 15 tablet Eustace Moore, MD   chlorpheniramine-HYDROcodone (TUSSIONEX) 10-8 MG/5ML Take 5 mLs by mouth every 12 (twelve) hours as needed for cough. 115 mL Eustace Moore, MD      PDMP not reviewed this encounter.   Eustace Moore, MD 04/23/23 [phone]

## 2023-04-23 NOTE — ED Triage Notes (Addendum)
Pt here today for continued cough and fatigue d/t pneumonia. Instructed by PCP to return for repeat cxr after 1 month since dx. Just had it done before coming up to UC. States she's still not feeling completely well. Having trouble sleeping, coughing at night, wheezing. Taking tylenol and robitussin DM prn.

## 2023-04-23 NOTE — Discharge Instructions (Signed)
Take the prednisone as directed.  2 pills a day for 5 days then 1 pill a day for 5 days then stop Use the inhaler as needed for shortness of breath, wheezing, and bad coughing spells Take the Tussionex at night Drink lots of fluids Please try to get more rest See your doctor if not improving in another week

## 2023-04-26 ENCOUNTER — Other Ambulatory Visit: Payer: Self-pay | Admitting: Medical-Surgical

## 2023-05-01 ENCOUNTER — Other Ambulatory Visit: Payer: Self-pay | Admitting: Medical-Surgical

## 2023-05-10 DIAGNOSIS — H353132 Nonexudative age-related macular degeneration, bilateral, intermediate dry stage: Secondary | ICD-10-CM | POA: Diagnosis not present

## 2023-05-30 DIAGNOSIS — M19072 Primary osteoarthritis, left ankle and foot: Secondary | ICD-10-CM | POA: Diagnosis not present

## 2023-05-30 DIAGNOSIS — M19071 Primary osteoarthritis, right ankle and foot: Secondary | ICD-10-CM | POA: Diagnosis not present

## 2023-05-31 ENCOUNTER — Other Ambulatory Visit: Payer: Self-pay | Admitting: Medical-Surgical

## 2023-05-31 DIAGNOSIS — F419 Anxiety disorder, unspecified: Secondary | ICD-10-CM

## 2023-05-31 DIAGNOSIS — Z780 Asymptomatic menopausal state: Secondary | ICD-10-CM

## 2023-06-09 DIAGNOSIS — H353132 Nonexudative age-related macular degeneration, bilateral, intermediate dry stage: Secondary | ICD-10-CM | POA: Diagnosis not present

## 2023-07-04 ENCOUNTER — Ambulatory Visit: Payer: PPO | Admitting: Medical-Surgical

## 2023-07-09 DIAGNOSIS — H353132 Nonexudative age-related macular degeneration, bilateral, intermediate dry stage: Secondary | ICD-10-CM | POA: Diagnosis not present

## 2023-07-14 ENCOUNTER — Other Ambulatory Visit: Payer: Self-pay | Admitting: Medical-Surgical

## 2023-07-16 ENCOUNTER — Ambulatory Visit (INDEPENDENT_AMBULATORY_CARE_PROVIDER_SITE_OTHER): Payer: PPO

## 2023-07-16 ENCOUNTER — Ambulatory Visit
Admission: EM | Admit: 2023-07-16 | Discharge: 2023-07-16 | Disposition: A | Discharge status: Home / Self Care | Payer: PPO | Attending: Physician Assistant | Admitting: Physician Assistant

## 2023-07-16 DIAGNOSIS — R42 Dizziness and giddiness: Secondary | ICD-10-CM | POA: Diagnosis not present

## 2023-07-16 DIAGNOSIS — R000 Tachycardia, unspecified: Secondary | ICD-10-CM

## 2023-07-16 DIAGNOSIS — R110 Nausea: Secondary | ICD-10-CM

## 2023-07-16 DIAGNOSIS — R0602 Shortness of breath: Secondary | ICD-10-CM

## 2023-07-16 DIAGNOSIS — R051 Acute cough: Secondary | ICD-10-CM

## 2023-07-16 DIAGNOSIS — Z86711 Personal history of pulmonary embolism: Secondary | ICD-10-CM

## 2023-07-16 DIAGNOSIS — J029 Acute pharyngitis, unspecified: Secondary | ICD-10-CM | POA: Diagnosis not present

## 2023-07-16 DIAGNOSIS — R519 Headache, unspecified: Secondary | ICD-10-CM | POA: Diagnosis not present

## 2023-07-16 LAB — POCT URINALYSIS DIP (MANUAL ENTRY)
Bilirubin, UA: NEGATIVE
Blood, UA: NEGATIVE
Glucose, UA: NEGATIVE mg/dL
Leukocytes, UA: NEGATIVE
Nitrite, UA: NEGATIVE
Protein Ur, POC: NEGATIVE mg/dL
Spec Grav, UA: 1.02 (ref 1.010–1.025)
Urobilinogen, UA: 0.2 U/dL
pH, UA: 6.5 (ref 5.0–8.0)

## 2023-07-16 LAB — POCT INFLUENZA A/B
Influenza A, POC: NEGATIVE
Influenza B, POC: NEGATIVE

## 2023-07-16 LAB — POCT FASTING CBG KUC MANUAL ENTRY: POCT Glucose (KUC): 87 mg/dL (ref 70–99)

## 2023-07-16 LAB — POC SARS CORONAVIRUS 2 AG -  ED: SARS Coronavirus 2 Ag: NEGATIVE

## 2023-07-16 MED ORDER — METHYLPREDNISOLONE SODIUM SUCC 125 MG IJ SOLR
60.0000 mg | Freq: Once | INTRAMUSCULAR | Status: AC
  Administered 2023-07-16: 60 mg via INTRAMUSCULAR

## 2023-07-16 MED ORDER — ALBUTEROL SULFATE HFA 108 (90 BASE) MCG/ACT IN AERS
1.0000 | INHALATION_SPRAY | Freq: Once | RESPIRATORY_TRACT | Status: AC
  Administered 2023-07-16: 2 via RESPIRATORY_TRACT

## 2023-07-16 MED ORDER — METHYLPREDNISOLONE SODIUM SUCC 40 MG IJ SOLR: 60.0000 mg | Freq: Once | INTRAMUSCULAR | Status: DC

## 2023-07-16 NOTE — Discharge Instructions (Signed)
 As we discussed, the safest thing to do is to go to the emergency room for further evaluation and management.

## 2023-07-16 NOTE — ED Provider Notes (Signed)
 [facility]    CSN: [id number] Arrival date & time: 07/16/23  1124      History   Chief Complaint Chief Complaint  Patient presents with   Cough   Generalized Body Aches   Dizziness    HPI [name] is a 68 y.o. female.   Patient presents today with a 2-day history of URI symptoms.  She reports sore throat, body aches, cough, lightheadedness, shortness of breath.  She denies any chest pain, nausea, vomiting, diarrhea.  Denies any known sick contacts.  She has not had COVID in the past.  She had initial COVID-19 vaccinations but has not had most recent boosters.  She has tried previously prescribed Hycodan as well as cold and flu medication without improvement of symptoms.  She does have an inhaler available and has used this without improvement of symptoms.  She is concerned because she has a history of PE several years ago that was unprovoked and current symptoms are somewhat similar to previous episodes of this condition.  She is not currently anticoagulated and has not been so for over a year.  She denies any recent hospitalization, active malignancy, immobilization, surgical procedure.  She does not take exogenous hormones.  She describes the lightheadedness as feeling that she is going to pass out but has not had any loss of consciousness.    Past Medical History:  Diagnosis Date   Anxiety    Asthma 02/21/2018   Back pain    Cancer (HCC)    Depression    GERD (gastroesophageal reflux disease)    Hypertension     Patient Active Problem List   Diagnosis Date Noted   Osteoarthritis of both ankles and feet 11/22/2022   At risk for obstructive sleep apnea 03/26/2020   History of pulmonary embolus (PE) 03/26/2020   Basal cell carcinoma (BCC) of right ala nasi 02/21/2019   Basal cell carcinoma (BCC) of right upper eyelid 02/07/2019   Dysphagia 10/30/2018   History of esophageal dilatation 10/30/2018   Chronic right shoulder pain 10/15/2018   Asthma  02/21/2018   Paralyzed hemidiaphragm 02/21/2018   Elevated hemidiaphragm 02/05/2018   Atelectasis 02/05/2018   Anxiety 02/01/2018   Cervical neck pain with evidence of disc disease 02/01/2018   BMI 38.0-38.9,adult 02/01/2018   Cataract 04/01/2015   Depression 04/01/2015   Hyperlipidemia 04/01/2015   Hypertension 04/01/2015   Disorder of intervertebral disc of cervical spine 04/01/2015   Lumbar disc disease 04/01/2015   Fatty (change of) liver, not elsewhere classified 05/29/2012    Past Surgical History:  Procedure Laterality Date   ABDOMINAL HYSTERECTOMY     ARTHROSCOPY WITH ANTERIOR CRUCIATE LIGAMENT (ACL) REPAIR WITH ANTERIOR TIBILIAS GRAFT     BACK SURGERY     EXCISION MASS ABDOMINAL Left 11/12/2020   Procedure: EXCISION LEFT MASS ABDOMINAL;  Surgeon: Sheliah Hatch, De Blanch, MD;  Location: [facility];  Service: General;  Laterality: Left;   FOOT SURGERY Left    SHOULDER ARTHROSCOPY W/ ROTATOR CUFF REPAIR     SHOULDER OPEN ROTATOR CUFF REPAIR     TUBAL LIGATION      OB History   No obstetric history on file.      Home Medications    Prior to Admission medications   Medication Sig Start Date End Date Taking? Authorizing Provider  albuterol (VENTOLIN HFA) 108 (90 Base) MCG/ACT inhaler Inhale 2 puffs into the lungs every 6 (six) hours as needed for wheezing or shortness of breath. 04/23/23   Rica Mast  Fannie Knee, MD  atorvastatin (LIPITOR) 10 MG tablet Take 1 tablet (10 mg total) by mouth daily. 04/04/23   Christen Butter, NP  buPROPion (WELLBUTRIN XL) 300 MG 24 hr tablet TAKE 1 TABLET BY MOUTH EVERY DAY 06/01/23   Christen Butter, NP  chlorpheniramine-HYDROcodone (TUSSIONEX) 10-8 MG/5ML Take 5 mLs by mouth every 12 (twelve) hours as needed for cough. 04/23/23   Eustace Moore, MD  Cholecalciferol (VITAMIN D) 125 MCG (5000 UT) CAPS Take 5,000 Units by mouth daily.    [provider]  DULoxetine (CYMBALTA) 60 MG capsule TAKE 1 CAPSULE BY MOUTH 2 TIMES DAILY. 11/24/22   Christen Butter, NP  famotidine (PEPCID) 40 MG tablet TAKE 1 TABLET BY MOUTH EVERYDAY AT BEDTIME 12/04/22   Doree Albee, PA-C  fexofenadine [facility]) 180 MG tablet Take 1 tablet (180 mg total) by mouth daily for 15 days. 05/29/22 10/24/22  Trevor Iha, FNP  HYDROcodone bit-homatropine (HYCODAN) 5-1.5 MG/5ML syrup Take 5 mLs by mouth every 6 (six) hours as needed for cough. 03/23/23   Trevor Iha, FNP  lisinopril (ZESTRIL) 10 MG tablet TAKE 1 TABLET BY MOUTH EVERY DAY 07/16/23   Christen Butter, NP  meloxicam (MOBIC) 15 MG tablet Take 1 tablet (15 mg total) by mouth daily. 02/15/23   Christen Butter, NP  mupirocin ointment (BACTROBAN) 2 % Apply 1 Application topically 2 (two) times daily as needed. 04/26/22   Christen Butter, NP  omeprazole (PRILOSEC) 40 MG capsule TAKE 1 CAPSULE BY MOUTH EVERY DAY IN THE MORNING AND AT BEDTIME 03/08/23   Christen Butter, NP  predniSONE (DELTASONE) 20 MG tablet Take 1 pill 2 times a day for 5 days then 1 pill daily for 5 days then discontinue 04/23/23   Eustace Moore, MD  pregabalin (LYRICA) 150 MG capsule Take 1 capsule (150 mg total) by mouth in the morning, at noon, and at bedtime. 02/15/23   Christen Butter, NP  vitamin E 1000 UNIT capsule Take 1,000 Units by mouth daily.    [provider]    Family History Family History  Problem Relation Age of Onset   Cancer Mother        lung   Cancer Father        lung   Hypertension Father    Diabetes Father    Breast cancer Sister 54   Breast cancer Maternal Aunt    Colon cancer Maternal Aunt    Esophageal cancer Maternal Uncle    Breast cancer Cousin    Non-Hodgkin's lymphoma Son    Rectal cancer Neg Hx    Stomach cancer Neg Hx     Social History Social History   Tobacco Use   Smoking status: Never    Passive exposure: Yes   Smokeless tobacco: Never  Vaping Use   Vaping status: Never Used  Substance Use Topics   Alcohol use: Yes    Comment: occasionally   Drug use: Never     Allergies   Nabumetone  and Topiramate   Review of Systems Review of Systems  Constitutional:  Positive for activity change and fatigue. Negative for appetite change and fever.  HENT:  Positive for sore throat. Negative for congestion, sinus pressure and sneezing.   Respiratory:  Positive for cough and shortness of breath.   Cardiovascular:  Negative for chest pain, palpitations and leg swelling.  Gastrointestinal:  Positive for nausea. Negative for abdominal pain, diarrhea and vomiting.  Musculoskeletal:  Positive for arthralgias and myalgias.  Neurological:  Positive  for light-headedness and headaches. Negative for dizziness, weakness and numbness.     Physical Exam Triage Vital Signs ED Triage Vitals  Encounter Vitals Group     BP 07/16/23 1149 (!) 148/89     Systolic BP Percentile --      Diastolic BP Percentile --      Pulse Rate 07/16/23 1149 (!) 106     Resp 07/16/23 1149 18     Temp 07/16/23 1149 98.1 F (36.7 C)     Temp Source 07/16/23 1149 Oral     SpO2 07/16/23 1149 92 %     Weight --      Height --      Head Circumference --      Peak Flow --      Pain Score 07/16/23 1150 7     Pain Loc --      Pain Education --      Exclude from Growth Chart --    Orthostatic VS for the past 24 hrs:  BP- Lying Pulse- Lying BP- Sitting Pulse- Sitting BP- Standing at 0 minutes Pulse- Standing at 0 minutes  07/16/23 1232 (!) 157/108 95 (!) 163/116 96 (!) 141/95 102    Updated Vital Signs BP (!) 137/92 (BP Location: Right Arm)   Pulse (!) 103   Temp 98.1 F (36.7 C) (Oral)   Resp 17   SpO2 100%   Visual Acuity Right Eye Distance:   Left Eye Distance:   Bilateral Distance:    Right Eye Near:   Left Eye Near:    Bilateral Near:     Physical Exam Vitals reviewed.  Constitutional:      General: She is awake. She is not in acute distress.    Appearance: Normal appearance. She is well-developed. She is ill-appearing.     Comments: Very pleasant female appears stated age in no acute  distress sitting comfortably in exam room  HENT:     Head: Normocephalic and atraumatic.     Right Ear: Tympanic membrane, ear canal and external ear normal. Tympanic membrane is not erythematous or bulging.     Left Ear: Tympanic membrane, ear canal and external ear normal. Tympanic membrane is not erythematous or bulging.     Nose:     Right Sinus: No maxillary sinus tenderness or frontal sinus tenderness.     Left Sinus: No maxillary sinus tenderness or frontal sinus tenderness.     Mouth/Throat:     Pharynx: Uvula midline. Posterior oropharyngeal erythema present. No oropharyngeal exudate.  Cardiovascular:     Rate and Rhythm: Regular rhythm. Tachycardia present.     Heart sounds: Normal heart sounds, S1 normal and S2 normal. No murmur heard. Pulmonary:     Effort: Pulmonary effort is normal.     Breath sounds: Examination of the right-lower field reveals decreased breath sounds. Decreased breath sounds present. No wheezing, rhonchi or rales.  Psychiatric:        Behavior: Behavior is cooperative.      UC Treatments / Results  Labs (all labs ordered are listed, but only abnormal results are displayed) Labs Reviewed  POCT URINALYSIS DIP (MANUAL ENTRY) - Abnormal; Notable for the following components:      Result Value   Ketones, POC UA small (15) (*)    All other components within normal limits  POCT FASTING CBG [facility] MANUAL ENTRY  POC SARS CORONAVIRUS 2 AG -  ED  POCT INFLUENZA A/B    EKG   Radiology No results  found.  Procedures Procedures (including critical care time)  Medications Ordered in UC Medications  albuterol (VENTOLIN HFA) 108 (90 Base) MCG/ACT inhaler 1-2 puff (2 puffs Inhalation Given 07/16/23 1304)  methylPREDNISolone sodium succinate (SOLU-MEDROL) 125 mg/2 mL injection 60 mg (60 mg Intramuscular Given 07/16/23 1300)    Initial Impression / Assessment and Plan / UC Course  I have reviewed the triage vital signs and the nursing notes.  Pertinent labs  & imaging results that were available during my care of the patient were reviewed by me and considered in my medical decision making (see chart for details).     Patient is ill-appearing but nontoxic.  She was tachycardic with an oxygen saturation of 92%.  We did discuss that the safest thing to do would be to go to the emergency room but she declined to do this and so we initiated workup in urgent care.  Random blood sugar was normal.  UA showed ketones but no evidence of dehydration.  She was negative for flu and COVID in clinic.  EKG was obtained that showed normal sinus rhythm with ventricular rate of 92 bpm without ischemic changes; compared to 11/15/2022 tracing T wave inversion in aVL but no other significant change.  Chest x-ray was obtained that showed no evidence of acute cardiopulmonary disease based on my primary read; we were waiting for radiologist overread at the time of discharge.  She was given Solu-Medrol and albuterol in clinic but this did not provide any relief of symptoms.  Orthostatic vital signs showed mild hypertension but were otherwise appropriate.  Since we were unable to find identifiable cause of her symptoms I again recommended going to the emergency room but she declined.  Her husband requested that CT be obtained in urgent care we discussed that we do not have these capabilities and so I recommended that they go to the ER.  Patient reported that she will consider going to the ER if her symptoms are not improving but will reach out to her primary care first.  After long conversation about risks and benefits including potential harms of not being evaluated, patient reported that she we will try to rest but if her symptoms do not improve significantly she will go to the ER.  Final Clinical Impressions(s) / UC Diagnoses   Final diagnoses:  Acute cough  Lightheadedness  Nausea without vomiting  Tachycardia  History of pulmonary embolism     Discharge Instructions       As we discussed, the safest thing to do is to go to the emergency room for further evaluation and management.     ED Prescriptions   None    PDMP not reviewed this encounter.   Jeani Hawking, PA-C 07/16/23 1432

## 2023-07-16 NOTE — ED Triage Notes (Signed)
 Pt c/o cough, bodyaches and dizziness since yesterday. No known fever. Tylenol and cough syrup prn. Hx of PE.

## 2023-07-17 ENCOUNTER — Encounter: Payer: Self-pay | Admitting: Medical-Surgical

## 2023-07-19 ENCOUNTER — Encounter: Payer: Self-pay | Admitting: Medical-Surgical

## 2023-07-19 ENCOUNTER — Ambulatory Visit (HOSPITAL_BASED_OUTPATIENT_CLINIC_OR_DEPARTMENT_OTHER): Admission: RE | Admit: 2023-07-19 | Payer: PPO | Source: Ambulatory Visit

## 2023-07-19 ENCOUNTER — Ambulatory Visit (INDEPENDENT_AMBULATORY_CARE_PROVIDER_SITE_OTHER): Payer: PPO | Admitting: Medical-Surgical

## 2023-07-19 VITALS — BP 119/80 | HR 99 | Temp 98.1°F | Resp 20 | Ht 63.0 in | Wt 219.0 lb

## 2023-07-19 DIAGNOSIS — J329 Chronic sinusitis, unspecified: Secondary | ICD-10-CM

## 2023-07-19 DIAGNOSIS — Z86711 Personal history of pulmonary embolism: Secondary | ICD-10-CM

## 2023-07-19 DIAGNOSIS — R6889 Other general symptoms and signs: Secondary | ICD-10-CM | POA: Diagnosis not present

## 2023-07-19 DIAGNOSIS — R0602 Shortness of breath: Secondary | ICD-10-CM

## 2023-07-19 DIAGNOSIS — J40 Bronchitis, not specified as acute or chronic: Secondary | ICD-10-CM

## 2023-07-19 MED ORDER — AMOXICILLIN-POT CLAVULANATE 875-125 MG PO TABS: 1.0000 | ORAL_TABLET | Freq: Two times a day (BID) | ORAL | 0 refills | Status: DC

## 2023-07-19 MED ORDER — HYDROCODONE BIT-HOMATROP MBR 5-1.5 MG/5ML PO SOLN
5.0000 mL | Freq: Three times a day (TID) | ORAL | 0 refills | Status: DC | PRN
Start: 2023-07-19 — End: 2023-11-26

## 2023-07-19 MED ORDER — METHYLPREDNISOLONE 4 MG PO TBPK: ORAL_TABLET | ORAL | 0 refills | Status: DC

## 2023-07-19 NOTE — Progress Notes (Unsigned)
 Established patient visit  History, exam, impression, and plan:  1. Sinobronchitis (Primary) Pleasant 68 year old female accompanied by her husband presenting today for further evaluation of her continued URI symptoms. Reports approximately 5-6 days of sinus pressure, ear pressure/pain, headache, shortness of breath, cough, and fatigue. Has been taking Tylenol and Mucinex without benefit. Was seen in UC 4 days ago where she was given a nebulizer treatment and a steroid shot. Today, she reports her symptoms have progressively worsened and she now has profound chest congestion, wheezing, and shortness of breath. Continues with sinus congestion and has developed facial pain over her forehead and maxillary sinuses. See below for physical exam. Plan to treat with Augmentin, Hycodan cough syrup prn, and Medrol dosepack. Continue conservative measures at home. If no improvement, return for further evaluation. Deferring chest x-ray today.   2. Shortness of breath 3. Activity intolerance 4. History of pulmonary embolus (PE) History of PE with reports of worsening shortness of breath and activity intolerance over the past few months. Lungs CTA in the bases with bilateral upper lobe wheezes. HRR without tachycardia. POX normal. Concern for worsening of symptoms and progression prior to the recent illness. Consider possible recurrence of non-occlusive PE. Getting STAT CTA chest for further evaluation.  - CT Angio Chest Pulmonary Embolism (PE) W or WO Contrast; Future  Physical Exam Vitals reviewed.  Constitutional:      General: She is not in acute distress.    Appearance: Normal appearance.  HENT:     Head: Normocephalic and atraumatic.     Right Ear: External ear normal. A middle ear effusion is present. There is no impacted cerumen.     Left Ear: External ear normal. A middle ear effusion is present. There is no impacted cerumen.     Ears:     Comments: Bilateral canal erythema    Nose:  Congestion present.     Right Turbinates: Swollen.     Left Turbinates: Swollen.     Right Sinus: Maxillary sinus tenderness and frontal sinus tenderness present.     Left Sinus: Maxillary sinus tenderness and frontal sinus tenderness present.     Mouth/Throat:     Mouth: Mucous membranes are moist.     Pharynx: Posterior oropharyngeal erythema present. No oropharyngeal exudate or uvula swelling.     Tonsils: No tonsillar exudate or tonsillar abscesses.  Eyes:     General: No scleral icterus.       Right eye: No discharge.        Left eye: No discharge.     Extraocular Movements: Extraocular movements intact.     Conjunctiva/sclera: Conjunctivae normal.     Pupils: Pupils are equal, round, and reactive to light.  Cardiovascular:     Rate and Rhythm: Normal rate and regular rhythm.     Pulses: Normal pulses.     Heart sounds: Normal heart sounds. No murmur heard.    No friction rub. No gallop.  Pulmonary:     Effort: Pulmonary effort is normal. No respiratory distress.     Breath sounds: Wheezing (bilateral posterior upper lobes) present.  Musculoskeletal:     Cervical back: Neck supple.  Lymphadenopathy:     Cervical: No cervical adenopathy.  Skin:    General: Skin is warm and dry.  Neurological:     Mental Status: She is alert and oriented to person, place, and time.  Psychiatric:        Mood and Affect: Mood normal.  Behavior: Behavior normal.        Thought Content: Thought content normal.        Judgment: Judgment normal.    Procedures performed this visit: None.  Return if symptoms worsen or fail to improve.  __________________________________ Thayer Ohm, DNP, APRN, FNP-BC Primary Care and Sports Medicine [facility]

## 2023-07-23 ENCOUNTER — Ambulatory Visit: Payer: PPO | Admitting: Medical-Surgical

## 2023-07-25 ENCOUNTER — Other Ambulatory Visit: Payer: Self-pay | Admitting: Medical-Surgical

## 2023-07-28 ENCOUNTER — Ambulatory Visit (HOSPITAL_BASED_OUTPATIENT_CLINIC_OR_DEPARTMENT_OTHER)
Admission: RE | Admit: 2023-07-28 | Discharge: 2023-07-28 | Disposition: A | Discharge status: Home / Self Care | Source: Ambulatory Visit | Attending: Medical-Surgical | Admitting: Medical-Surgical

## 2023-07-28 ENCOUNTER — Encounter: Payer: Self-pay | Admitting: Medical-Surgical

## 2023-07-28 DIAGNOSIS — R0602 Shortness of breath: Secondary | ICD-10-CM | POA: Diagnosis not present

## 2023-07-28 DIAGNOSIS — R6889 Other general symptoms and signs: Secondary | ICD-10-CM | POA: Diagnosis not present

## 2023-07-28 DIAGNOSIS — J9811 Atelectasis: Secondary | ICD-10-CM | POA: Diagnosis not present

## 2023-07-28 DIAGNOSIS — Z86711 Personal history of pulmonary embolism: Secondary | ICD-10-CM | POA: Insufficient documentation

## 2023-07-28 DIAGNOSIS — R531 Weakness: Secondary | ICD-10-CM | POA: Diagnosis not present

## 2023-07-28 MED ORDER — IOHEXOL 350 MG/ML SOLN
75.0000 mL | Freq: Once | INTRAVENOUS | Status: AC | PRN
  Administered 2023-07-28: 75 mL via INTRAVENOUS

## 2023-08-05 ENCOUNTER — Other Ambulatory Visit: Payer: Self-pay | Admitting: Medical-Surgical

## 2023-08-05 DIAGNOSIS — Z780 Asymptomatic menopausal state: Secondary | ICD-10-CM

## 2023-08-05 DIAGNOSIS — F419 Anxiety disorder, unspecified: Secondary | ICD-10-CM

## 2023-08-08 DIAGNOSIS — H353132 Nonexudative age-related macular degeneration, bilateral, intermediate dry stage: Secondary | ICD-10-CM | POA: Diagnosis not present

## 2023-08-14 ENCOUNTER — Other Ambulatory Visit: Payer: Self-pay | Admitting: Medical-Surgical

## 2023-08-28 ENCOUNTER — Other Ambulatory Visit: Payer: Self-pay | Admitting: Medical-Surgical

## 2023-08-28 ENCOUNTER — Other Ambulatory Visit: Payer: Self-pay

## 2023-08-28 DIAGNOSIS — G8929 Other chronic pain: Secondary | ICD-10-CM

## 2023-08-28 MED ORDER — PREGABALIN 150 MG PO CAPS: 150.0000 mg | ORAL_CAPSULE | Freq: Three times a day (TID) | ORAL | 1 refills | Status: AC

## 2023-08-29 DIAGNOSIS — M19072 Primary osteoarthritis, left ankle and foot: Secondary | ICD-10-CM | POA: Diagnosis not present

## 2023-08-29 DIAGNOSIS — M19071 Primary osteoarthritis, right ankle and foot: Secondary | ICD-10-CM | POA: Diagnosis not present

## 2023-08-30 DIAGNOSIS — H43813 Vitreous degeneration, bilateral: Secondary | ICD-10-CM | POA: Diagnosis not present

## 2023-08-30 DIAGNOSIS — H35371 Puckering of macula, right eye: Secondary | ICD-10-CM | POA: Diagnosis not present

## 2023-08-30 DIAGNOSIS — H353132 Nonexudative age-related macular degeneration, bilateral, intermediate dry stage: Secondary | ICD-10-CM | POA: Diagnosis not present

## 2023-08-30 DIAGNOSIS — H2513 Age-related nuclear cataract, bilateral: Secondary | ICD-10-CM | POA: Diagnosis not present

## 2023-08-31 ENCOUNTER — Other Ambulatory Visit: Payer: Self-pay | Admitting: Medical-Surgical

## 2023-08-31 DIAGNOSIS — K219 Gastro-esophageal reflux disease without esophagitis: Secondary | ICD-10-CM

## 2023-09-07 DIAGNOSIS — H353132 Nonexudative age-related macular degeneration, bilateral, intermediate dry stage: Secondary | ICD-10-CM | POA: Diagnosis not present

## 2023-09-10 DIAGNOSIS — M12811 Other specific arthropathies, not elsewhere classified, right shoulder: Secondary | ICD-10-CM | POA: Diagnosis not present

## 2023-09-10 DIAGNOSIS — M75101 Unspecified rotator cuff tear or rupture of right shoulder, not specified as traumatic: Secondary | ICD-10-CM | POA: Diagnosis not present

## 2023-09-10 DIAGNOSIS — M25511 Pain in right shoulder: Secondary | ICD-10-CM | POA: Diagnosis not present

## 2023-09-25 ENCOUNTER — Encounter: Payer: Self-pay | Admitting: Medical-Surgical

## 2023-09-25 DIAGNOSIS — Z85828 Personal history of other malignant neoplasm of skin: Secondary | ICD-10-CM

## 2023-10-07 DIAGNOSIS — H353132 Nonexudative age-related macular degeneration, bilateral, intermediate dry stage: Secondary | ICD-10-CM | POA: Diagnosis not present

## 2023-10-25 ENCOUNTER — Ambulatory Visit
Admission: EM | Admit: 2023-10-25 | Discharge: 2023-10-25 | Disposition: A | Discharge status: Home / Self Care | Attending: Family Medicine | Admitting: Family Medicine

## 2023-10-25 DIAGNOSIS — Z85828 Personal history of other malignant neoplasm of skin: Secondary | ICD-10-CM | POA: Insufficient documentation

## 2023-10-25 DIAGNOSIS — R634 Abnormal weight loss: Secondary | ICD-10-CM | POA: Diagnosis not present

## 2023-10-25 DIAGNOSIS — M5030 Other cervical disc degeneration, unspecified cervical region: Secondary | ICD-10-CM | POA: Insufficient documentation

## 2023-10-25 DIAGNOSIS — R197 Diarrhea, unspecified: Secondary | ICD-10-CM

## 2023-10-25 DIAGNOSIS — A045 Campylobacter enteritis: Secondary | ICD-10-CM | POA: Diagnosis not present

## 2023-10-25 DIAGNOSIS — A040 Enteropathogenic Escherichia coli infection: Secondary | ICD-10-CM | POA: Diagnosis not present

## 2023-10-25 DIAGNOSIS — R109 Unspecified abdominal pain: Secondary | ICD-10-CM | POA: Insufficient documentation

## 2023-10-25 LAB — C DIFFICILE QUICK SCREEN W PCR REFLEX
C Diff antigen: NEGATIVE
C Diff interpretation: NOT DETECTED
C Diff toxin: NEGATIVE

## 2023-10-25 MED ORDER — DICYCLOMINE HCL 20 MG PO TABS
20.0000 mg | ORAL_TABLET | Freq: Three times a day (TID) | ORAL | 0 refills | Status: DC | PRN
Start: 2023-10-25 — End: 2023-11-26

## 2023-10-25 NOTE — ED Triage Notes (Addendum)
 Pt presents to uc with loose stool, abd cramping since Sunday with bloating. Pt has attempted otc pepto and imodium with no imrporvement. She reports she has lost 11 lbs

## 2023-10-25 NOTE — ED Provider Notes (Signed)
 [facility]    CSN: [id number] Arrival date & time: 10/25/23  1056      History   Chief Complaint Chief Complaint  Patient presents with   Diarrhea    HPI [name] is a 68 y.o. female.   HPI Very pleasant 68 year old female presents with loose stool, diarrhea, and abdominal cramping for 4 days.  PMH significant for BCC, cataract, and cervical pain with evidence of disc disease.  Patient is accompanied by her husband today.  Past Medical History:  Diagnosis Date   Anxiety    Asthma 02/21/2018   Back pain    Cancer (HCC)    Depression    GERD (gastroesophageal reflux disease)    Hypertension     Patient Active Problem List   Diagnosis Date Noted   Osteoarthritis of both ankles and feet 11/22/2022   At risk for obstructive sleep apnea 03/26/2020   History of pulmonary embolus (PE) 03/26/2020   Basal cell carcinoma (BCC) of right ala nasi 02/21/2019   Basal cell carcinoma (BCC) of right upper eyelid 02/07/2019   Dysphagia 10/30/2018   History of esophageal dilatation 10/30/2018   Chronic right shoulder pain 10/15/2018   Asthma 02/21/2018   Paralyzed hemidiaphragm 02/21/2018   Elevated hemidiaphragm 02/05/2018   Atelectasis 02/05/2018   Anxiety 02/01/2018   Cervical neck pain with evidence of disc disease 02/01/2018   BMI 38.0-38.9,adult 02/01/2018   Cataract 04/01/2015   Depression 04/01/2015   Hyperlipidemia 04/01/2015   Hypertension 04/01/2015   Disorder of intervertebral disc of cervical spine 04/01/2015   Lumbar disc disease 04/01/2015   Fatty (change of) liver, not elsewhere classified 05/29/2012    Past Surgical History:  Procedure Laterality Date   ABDOMINAL HYSTERECTOMY     ARTHROSCOPY WITH ANTERIOR CRUCIATE LIGAMENT (ACL) REPAIR WITH ANTERIOR TIBILIAS GRAFT     BACK SURGERY     EXCISION MASS ABDOMINAL Left 11/12/2020   Procedure: EXCISION LEFT MASS ABDOMINAL;  Surgeon: Dorrie Gaudier, Alphonso Aschoff, MD;  Location: [facility];  Service: General;   Laterality: Left;   FOOT SURGERY Left    SHOULDER ARTHROSCOPY W/ ROTATOR CUFF REPAIR     SHOULDER OPEN ROTATOR CUFF REPAIR     TUBAL LIGATION      OB History   No obstetric history on file.      Home Medications    Prior to Admission medications   Medication Sig Start Date End Date Taking? Authorizing Provider  dicyclomine  (BENTYL ) 20 MG tablet Take 1 tablet (20 mg total) by mouth 3 (three) times daily as needed for spasms. 10/25/23  Yes Leonides Ramp, FNP  albuterol  (VENTOLIN  HFA) 108 (90 Base) MCG/ACT inhaler Inhale 2 puffs into the lungs every 6 (six) hours as needed for wheezing or shortness of breath. 04/23/23   Stephany Ehrich, MD  amoxicillin -clavulanate (AUGMENTIN ) 875-125 MG tablet Take 1 tablet by mouth 2 (two) times daily. 07/19/23   Cherre Cornish, NP  atorvastatin  (LIPITOR) 10 MG tablet Take 1 tablet (10 mg total) by mouth daily. 04/04/23   Cherre Cornish, NP  buPROPion  (WELLBUTRIN  XL) 300 MG 24 hr tablet TAKE 1 TABLET BY MOUTH EVERY DAY 06/01/23   Cherre Cornish, NP  Cholecalciferol (VITAMIN D) 125 MCG (5000 UT) CAPS Take 5,000 Units by mouth daily.    [provider]  DULoxetine  (CYMBALTA ) 30 MG capsule TAKE 1 CAPSULE (30 MG TOTAL) BY MOUTH AT BEDTIME. 07/25/23   Cherre Cornish, NP  DULoxetine  (CYMBALTA ) 60 MG capsule TAKE 1 CAPSULE BY MOUTH  2 TIMES DAILY. 11/24/22   Jessup, Joy, NP  famotidine  (PEPCID ) 40 MG tablet TAKE 1 TABLET BY MOUTH EVERYDAY AT BEDTIME 12/04/22   Edmonia Gottron, PA-C  fexofenadine  (ALLEGRA  ALLERGY) 180 MG tablet Take 1 tablet (180 mg total) by mouth daily for 15 days. 05/29/22 10/24/22  Leonides Ramp, FNP  HYDROcodone  bit-homatropine (HYCODAN) 5-1.5 MG/5ML syrup Take 5 mLs by mouth every 8 (eight) hours as needed for cough. 07/19/23   Cherre Cornish, NP  lisinopril  (ZESTRIL ) 10 MG tablet TAKE 1 TABLET BY MOUTH EVERY DAY 08/15/23   Cherre Cornish, NP  meloxicam  (MOBIC ) 15 MG tablet Take 1 tablet (15 mg total) by mouth daily. 02/15/23   Cherre Cornish, NP   methylPREDNISolone  (MEDROL  DOSEPAK) 4 MG TBPK tablet Take as directed. 07/19/23   Cherre Cornish, NP  mupirocin  ointment (BACTROBAN ) 2 % Apply 1 Application topically 2 (two) times daily as needed. 04/26/22   Cherre Cornish, NP  omeprazole  (PRILOSEC) 40 MG capsule TAKE 1 CAPSULE BY MOUTH EVERY DAY IN THE MORNING AND AT BEDTIME 09/03/23   Cherre Cornish, NP  pregabalin  (LYRICA ) 150 MG capsule Take 1 capsule (150 mg total) by mouth in the morning, at noon, and at bedtime. 08/28/23   Cherre Cornish, NP  vitamin E 1000 UNIT capsule Take 1,000 Units by mouth daily.    [provider]    Family History Family History  Problem Relation Age of Onset   Cancer Mother        lung   Cancer Father        lung   Hypertension Father    Diabetes Father    Breast cancer Sister 82   Breast cancer Maternal Aunt    Colon cancer Maternal Aunt    Esophageal cancer Maternal Uncle    Breast cancer Cousin    Non-Hodgkin's lymphoma Son    Rectal cancer Neg Hx    Stomach cancer Neg Hx     Social History Social History   Tobacco Use   Smoking status: Never    Passive exposure: Yes   Smokeless tobacco: Never  Vaping Use   Vaping status: Never Used  Substance Use Topics   Alcohol use: Yes    Comment: occasionally   Drug use: Never     Allergies   Nabumetone and Topiramate   Review of Systems Review of Systems  Gastrointestinal:  Positive for diarrhea.  All other systems reviewed and are negative.    Physical Exam Triage Vital Signs ED Triage Vitals  Encounter Vitals Group     BP      Systolic BP Percentile      Diastolic BP Percentile      Pulse      Resp      Temp      Temp src      SpO2      Weight      Height      Head Circumference      Peak Flow      Pain Score      Pain Loc      Pain Education      Exclude from Growth Chart    No data found.  Updated Vital Signs BP (!) 146/93   Pulse 100   Temp 98 F (36.7 C)   Resp 16   SpO2 98%    Physical Exam Vitals and  nursing note reviewed.  Constitutional:      General: She is not in acute distress.  Appearance: Normal appearance. She is obese. She is ill-appearing. She is not toxic-appearing or diaphoretic.  HENT:     Head: Normocephalic and atraumatic.     Mouth/Throat:     Mouth: Mucous membranes are moist.     Pharynx: Oropharynx is clear.  Eyes:     Extraocular Movements: Extraocular movements intact.     Conjunctiva/sclera: Conjunctivae normal.     Pupils: Pupils are equal, round, and reactive to light.  Cardiovascular:     Rate and Rhythm: Normal rate and regular rhythm.     Pulses: Normal pulses.     Heart sounds: Normal heart sounds.  Pulmonary:     Effort: Pulmonary effort is normal.     Breath sounds: Normal breath sounds. No wheezing, rhonchi or rales.  Abdominal:     General: Bowel sounds are normal. There is no distension.     Palpations: Abdomen is soft. There is no mass.     Tenderness: There is no abdominal tenderness. There is no right CVA tenderness, left CVA tenderness, guarding or rebound.     Hernia: No hernia is present.  Musculoskeletal:        General: Normal range of motion.  Skin:    General: Skin is warm and dry.  Neurological:     General: No focal deficit present.     Mental Status: She is alert and oriented to person, place, and time. Mental status is at baseline.  Psychiatric:        Mood and Affect: Mood normal.        Behavior: Behavior normal.      UC Treatments / Results  Labs (all labs ordered are listed, but only abnormal results are displayed) Labs Reviewed  OVA + PARASITE EXAM  CLOSTRIDIUM DIFFICILE BY PCR  GI PROFILE, STOOL, PCR    EKG   Radiology No results found.  Procedures Procedures (including critical care time)  Medications Ordered in UC Medications - No data to display  Initial Impression / Assessment and Plan / UC Course  I have reviewed the triage vital signs and the nursing notes.  Pertinent labs & imaging results  that were available during my care of the patient were reviewed by me and considered in my medical decision making (see chart for details).     MDM: 1.  Diarrhea in adult patient-ova plus parasite, C. difficile by PCR, and GI stool PCR ordered; 2.  Abdominal cramping-Rx'd Bentyl  20 mg tablet: Take 1 tablet by mouth 3 times daily, as needed for spasms/abdominal cramping.  Advised patient may take Bentyl  daily, as needed for diarrhea.  Encouraged patient to increase daily water intake to 64 ounces per day advised may include Gatorade G2 and Pedialyte for cardiac electrolyte replacement.  Advised we will follow-up with lab results once received.  Advised if symptoms worsen and/or unresolved please follow-up with PCP, go to [facility] ED or here for further evaluation. Final Clinical Impressions(s) / UC Diagnoses   Final diagnoses:  Diarrhea in adult patient  Abdominal cramping     Discharge Instructions      Patient advised to return stool kit by 4 PM today.  Advised patient may take Bentyl  daily, as needed for diarrhea.  Encouraged patient to increase daily water intake to 64 ounces per day advised may include Gatorade G2 and Pedialyte for cardiac electrolyte replacement.  Advised we will follow-up with lab results once received.  Advised if symptoms worsen and/or unresolved please follow-up with PCP, go  to [facility] ED or here for further evaluation.   ED Prescriptions     Medication Sig Dispense Auth. Provider   dicyclomine  (BENTYL ) 20 MG tablet Take 1 tablet (20 mg total) by mouth 3 (three) times daily as needed for spasms. 30 tablet Finley Dinkel, FNP      PDMP not reviewed this encounter.   Leonides Ramp, FNP 10/25/23 1150

## 2023-10-25 NOTE — Discharge Instructions (Addendum)
 Patient advised to return stool kit by 4 PM today.  Advised patient may take Bentyl  daily, as needed for diarrhea.  Encouraged patient to increase daily water intake to 64 ounces per day advised may include Gatorade G2 and Pedialyte for cardiac electrolyte replacement.  Advised we will follow-up with lab results once received.  Advised if symptoms worsen and/or unresolved please follow-up with PCP, go to [facility] ED or here for further evaluation.

## 2023-10-26 ENCOUNTER — Telehealth: Payer: Self-pay

## 2023-10-26 ENCOUNTER — Telehealth: Payer: Self-pay | Admitting: Family Medicine

## 2023-10-26 ENCOUNTER — Encounter: Payer: Self-pay | Admitting: Medical-Surgical

## 2023-10-26 LAB — GASTROINTESTINAL PANEL BY PCR, STOOL (REPLACES STOOL CULTURE)

## 2023-10-26 MED ORDER — AZITHROMYCIN 500 MG PO TABS: 500.0000 mg | ORAL_TABLET | Freq: Every day | ORAL | 0 refills | Status: DC

## 2023-10-26 NOTE — Telephone Encounter (Signed)
 GI panel positive for campylobacter species and enteropathogenic e.coli.  Because of the presence of two pathogens, will begin azithromycin  500mg , one daily for three days.  Continue increased fluids with Gatorade G2 and Pedialyte.  Avoid milk products until well. If symptoms become significantly worse during the night or over the weekend, proceed to the local emergency room.

## 2023-10-26 NOTE — Telephone Encounter (Signed)
 Pt tested pos for 2 types of pathogens from GI Panel. Call from [facility] confirmed. Note send to provider on staff today. Rx for azithromycin  to pts pharmacy. VM left on pts phone to pick up and start immediately. Call back if any questions or concerns.

## 2023-11-01 DIAGNOSIS — Z01818 Encounter for other preprocedural examination: Secondary | ICD-10-CM | POA: Diagnosis not present

## 2023-11-01 DIAGNOSIS — G8929 Other chronic pain: Secondary | ICD-10-CM | POA: Diagnosis not present

## 2023-11-01 DIAGNOSIS — Z01810 Encounter for preprocedural cardiovascular examination: Secondary | ICD-10-CM | POA: Diagnosis not present

## 2023-11-01 DIAGNOSIS — Z79899 Other long term (current) drug therapy: Secondary | ICD-10-CM | POA: Diagnosis not present

## 2023-11-01 DIAGNOSIS — Z01812 Encounter for preprocedural laboratory examination: Secondary | ICD-10-CM | POA: Diagnosis not present

## 2023-11-01 DIAGNOSIS — M25511 Pain in right shoulder: Secondary | ICD-10-CM | POA: Diagnosis not present

## 2023-11-01 DIAGNOSIS — I10 Essential (primary) hypertension: Secondary | ICD-10-CM | POA: Diagnosis not present

## 2023-11-05 LAB — OVA + PARASITE EXAM

## 2023-11-05 LAB — O&P RESULT

## 2023-11-06 DIAGNOSIS — H353132 Nonexudative age-related macular degeneration, bilateral, intermediate dry stage: Secondary | ICD-10-CM | POA: Diagnosis not present

## 2023-11-07 DIAGNOSIS — M25511 Pain in right shoulder: Secondary | ICD-10-CM | POA: Diagnosis not present

## 2023-11-07 DIAGNOSIS — A09 Infectious gastroenteritis and colitis, unspecified: Secondary | ICD-10-CM | POA: Diagnosis not present

## 2023-11-14 DIAGNOSIS — M19011 Primary osteoarthritis, right shoulder: Secondary | ICD-10-CM | POA: Insufficient documentation

## 2023-11-15 DIAGNOSIS — E785 Hyperlipidemia, unspecified: Secondary | ICD-10-CM | POA: Diagnosis not present

## 2023-11-15 DIAGNOSIS — M19011 Primary osteoarthritis, right shoulder: Secondary | ICD-10-CM | POA: Diagnosis not present

## 2023-11-15 DIAGNOSIS — I10 Essential (primary) hypertension: Secondary | ICD-10-CM | POA: Diagnosis not present

## 2023-11-15 DIAGNOSIS — Z9189 Other specified personal risk factors, not elsewhere classified: Secondary | ICD-10-CM | POA: Diagnosis not present

## 2023-11-15 DIAGNOSIS — R0600 Dyspnea, unspecified: Secondary | ICD-10-CM | POA: Diagnosis not present

## 2023-11-15 DIAGNOSIS — Z96611 Presence of right artificial shoulder joint: Secondary | ICD-10-CM | POA: Diagnosis not present

## 2023-11-15 DIAGNOSIS — Z86711 Personal history of pulmonary embolism: Secondary | ICD-10-CM | POA: Diagnosis not present

## 2023-11-15 DIAGNOSIS — M12511 Traumatic arthropathy, right shoulder: Secondary | ICD-10-CM | POA: Diagnosis not present

## 2023-11-15 DIAGNOSIS — F419 Anxiety disorder, unspecified: Secondary | ICD-10-CM | POA: Diagnosis not present

## 2023-11-15 DIAGNOSIS — E669 Obesity, unspecified: Secondary | ICD-10-CM | POA: Diagnosis not present

## 2023-11-15 DIAGNOSIS — J9811 Atelectasis: Secondary | ICD-10-CM | POA: Diagnosis not present

## 2023-11-15 DIAGNOSIS — J986 Disorders of diaphragm: Secondary | ICD-10-CM | POA: Diagnosis not present

## 2023-11-15 DIAGNOSIS — G8918 Other acute postprocedural pain: Secondary | ICD-10-CM | POA: Diagnosis not present

## 2023-11-15 DIAGNOSIS — J45909 Unspecified asthma, uncomplicated: Secondary | ICD-10-CM | POA: Diagnosis not present

## 2023-11-15 DIAGNOSIS — K219 Gastro-esophageal reflux disease without esophagitis: Secondary | ICD-10-CM | POA: Diagnosis not present

## 2023-11-15 DIAGNOSIS — M12811 Other specific arthropathies, not elsewhere classified, right shoulder: Secondary | ICD-10-CM | POA: Diagnosis not present

## 2023-11-15 DIAGNOSIS — Z471 Aftercare following joint replacement surgery: Secondary | ICD-10-CM | POA: Diagnosis not present

## 2023-11-16 DIAGNOSIS — J9811 Atelectasis: Secondary | ICD-10-CM | POA: Diagnosis not present

## 2023-11-16 DIAGNOSIS — J986 Disorders of diaphragm: Secondary | ICD-10-CM | POA: Diagnosis not present

## 2023-11-17 DIAGNOSIS — Z800 Family history of malignant neoplasm of digestive organs: Secondary | ICD-10-CM | POA: Diagnosis not present

## 2023-11-17 DIAGNOSIS — F32A Depression, unspecified: Secondary | ICD-10-CM | POA: Diagnosis not present

## 2023-11-17 DIAGNOSIS — Z79899 Other long term (current) drug therapy: Secondary | ICD-10-CM | POA: Diagnosis not present

## 2023-11-17 DIAGNOSIS — J9601 Acute respiratory failure with hypoxia: Secondary | ICD-10-CM | POA: Diagnosis not present

## 2023-11-17 DIAGNOSIS — K219 Gastro-esophageal reflux disease without esophagitis: Secondary | ICD-10-CM | POA: Diagnosis not present

## 2023-11-17 DIAGNOSIS — R918 Other nonspecific abnormal finding of lung field: Secondary | ICD-10-CM | POA: Diagnosis not present

## 2023-11-17 DIAGNOSIS — J4520 Mild intermittent asthma, uncomplicated: Secondary | ICD-10-CM | POA: Diagnosis not present

## 2023-11-17 DIAGNOSIS — G8929 Other chronic pain: Secondary | ICD-10-CM | POA: Diagnosis not present

## 2023-11-17 DIAGNOSIS — N183 Chronic kidney disease, stage 3 unspecified: Secondary | ICD-10-CM | POA: Diagnosis not present

## 2023-11-17 DIAGNOSIS — E662 Morbid (severe) obesity with alveolar hypoventilation: Secondary | ICD-10-CM | POA: Diagnosis not present

## 2023-11-17 DIAGNOSIS — K769 Liver disease, unspecified: Secondary | ICD-10-CM | POA: Diagnosis not present

## 2023-11-17 DIAGNOSIS — Z85828 Personal history of other malignant neoplasm of skin: Secondary | ICD-10-CM | POA: Diagnosis not present

## 2023-11-17 DIAGNOSIS — I129 Hypertensive chronic kidney disease with stage 1 through stage 4 chronic kidney disease, or unspecified chronic kidney disease: Secondary | ICD-10-CM | POA: Diagnosis not present

## 2023-11-17 DIAGNOSIS — J986 Disorders of diaphragm: Secondary | ICD-10-CM | POA: Diagnosis not present

## 2023-11-17 DIAGNOSIS — M19011 Primary osteoarthritis, right shoulder: Secondary | ICD-10-CM | POA: Diagnosis not present

## 2023-11-17 DIAGNOSIS — I10 Essential (primary) hypertension: Secondary | ICD-10-CM | POA: Diagnosis not present

## 2023-11-17 DIAGNOSIS — F419 Anxiety disorder, unspecified: Secondary | ICD-10-CM | POA: Diagnosis not present

## 2023-11-17 DIAGNOSIS — R0602 Shortness of breath: Secondary | ICD-10-CM | POA: Diagnosis not present

## 2023-11-17 DIAGNOSIS — Z6839 Body mass index (BMI) 39.0-39.9, adult: Secondary | ICD-10-CM | POA: Diagnosis not present

## 2023-11-17 DIAGNOSIS — Z791 Long term (current) use of non-steroidal anti-inflammatories (NSAID): Secondary | ICD-10-CM | POA: Diagnosis not present

## 2023-11-17 DIAGNOSIS — Z86711 Personal history of pulmonary embolism: Secondary | ICD-10-CM | POA: Diagnosis not present

## 2023-11-17 DIAGNOSIS — Z90710 Acquired absence of both cervix and uterus: Secondary | ICD-10-CM | POA: Diagnosis not present

## 2023-11-17 DIAGNOSIS — E785 Hyperlipidemia, unspecified: Secondary | ICD-10-CM | POA: Diagnosis not present

## 2023-11-19 DIAGNOSIS — M12811 Other specific arthropathies, not elsewhere classified, right shoulder: Secondary | ICD-10-CM | POA: Insufficient documentation

## 2023-11-19 LAB — MISCELLANEOUS TEST

## 2023-11-20 ENCOUNTER — Telehealth: Payer: Self-pay | Admitting: *Deleted

## 2023-11-20 NOTE — Transitions of Care (Post Inpatient/ED Visit) (Signed)
   11/20/2023  Name: SELITA STAIGER MRN: [id number] DOB: 1956/03/18  Today's TOC FU Call Status: Today's TOC FU Call Status:: Successful TOC FU Call Completed TOC FU Call Complete Date: 11/20/23  Attempted to reach the patient regarding the most recent Inpatient/ED visit. Patient had some shortness of breath/ Unable to complete Follow Up Plan: Additional outreach attempts will be made to reach the patient to complete the Transitions of Care (Post Inpatient/ED visit) call.   Cathlean Headland BSN RN [facility] Care Management Coordinator [email or web address] Direct Dial: [phone]  Fax: [phone] Website: [email or web address]

## 2023-11-21 ENCOUNTER — Telehealth: Payer: Self-pay | Admitting: *Deleted

## 2023-11-21 NOTE — Transitions of Care (Post Inpatient/ED Visit) (Signed)
 11/21/2023  Name: [name] MRN: [id number] DOB: 1956/01/07  Today's TOC FU Call Status: Today's TOC FU Call Status:: Successful TOC FU Call Completed TOC FU Call Complete Date: 11/21/23 Patient's Name and Date of Birth confirmed.  Transition Care Management Follow-up Telephone Call Date of Discharge: 11/19/23 Discharge Facility: Other [facility]) Name of Other ([facility]) Discharge Facility: [facility] Type of Discharge: Inpatient Admission Primary Inpatient Discharge Diagnosis:: Status post reverse total shoulder replacement, righ How have you been since you were released from the hospital?: Better Any questions or concerns?: No  Items Reviewed: Did you receive and understand the discharge instructions provided?: Yes Medications obtained,verified, and reconciled?: Yes (Medications Reviewed) Any new allergies since your discharge?: No Dietary orders reviewed?: No Do you have support at home?: Yes People in Home [RPT]: spouse Name of Support/Comfort Primary Source: Timothy  Medications Reviewed Today: Medications Reviewed Today     Reviewed by Kennieth Cathlean DEL, RN (Case Manager) on 11/21/23 at 1614  Med List Status: <None>   Medication Order Taking? Sig Documenting Provider Last Dose Status Informant  albuterol  (VENTOLIN  HFA) 108 (90 Base) MCG/ACT inhaler [id number] Yes Inhale 2 puffs into the lungs every 6 (six) hours as needed for wheezing or shortness of breath. Maranda Jamee Jacob, MD  Active   amoxicillin -clavulanate (AUGMENTIN ) 875-125 MG tablet [id number]  Take 1 tablet by mouth 2 (two) times daily. Willo Mini, NP  Active   atorvastatin  (LIPITOR) 10 MG tablet [id number] Yes Take 1 tablet (10 mg total) by mouth daily. Willo Mini, NP  Active   azithromycin  (ZITHROMAX ) 500 MG tablet [id number]  Take 1 tablet (500 mg total) by mouth daily. Pauline Garnette LABOR, MD  Active   buPROPion  (WELLBUTRIN  XL) 300 MG 24 hr tablet [id number] Yes TAKE 1 TABLET BY  MOUTH EVERY DAY Jessup, Joy, NP  Active   Cholecalciferol (VITAMIN D) 125 MCG (5000 UT) CAPS [id number] Yes Take 5,000 Units by mouth daily. [provider]  Active Self  dicyclomine  (BENTYL ) 20 MG tablet [id number]  Take 1 tablet (20 mg total) by mouth 3 (three) times daily as needed for spasms. Ragan, Michael, FNP  Active   DULoxetine  (CYMBALTA ) 30 MG capsule [id number] Yes TAKE 1 CAPSULE (30 MG TOTAL) BY MOUTH AT BEDTIME. Willo Mini, NP  Active   DULoxetine  (CYMBALTA ) 60 MG capsule [id number] Yes TAKE 1 CAPSULE BY MOUTH 2 TIMES DAILY. Willo Mini, NP  Active   famotidine  (PEPCID ) 40 MG tablet [id number] Yes TAKE 1 TABLET BY MOUTH EVERYDAY AT BEDTIME Collier, Imya R, PA-C  Active   fexofenadine  (ALLEGRA  ALLERGY) 180 MG tablet [id number]  Take 1 tablet (180 mg total) by mouth daily for 15 days.  Patient not taking: Reported on 11/21/2023   Teddy Sharper, FNP  Expired 10/24/22 2359   HYDROcodone  bit-homatropine (HYCODAN) 5-1.5 MG/5ML syrup [id number]  Take 5 mLs by mouth every 8 (eight) hours as needed for cough. Willo Mini, NP  Active   lisinopril  (ZESTRIL ) 10 MG tablet [id number] Yes TAKE 1 TABLET BY MOUTH EVERY DAY Willo Mini, NP  Active   meloxicam  (MOBIC ) 15 MG tablet [id number]  Take 1 tablet (15 mg total) by mouth daily. Willo Mini, NP  Active   methylPREDNISolone  (MEDROL  DOSEPAK) 4 MG TBPK tablet [id number]  Take as directed. Willo Mini, NP  Active   mupirocin  ointment (BACTROBAN ) 2 % [id number] Yes Apply 1 Application topically 2 (two) times daily as needed. Willo Mini, NP  Active   omeprazole  (PRILOSEC) 40 MG capsule [id number]  Yes TAKE 1 CAPSULE BY MOUTH EVERY DAY IN THE MORNING AND AT BEDTIME Willo Mini, NP  Active   pregabalin  (LYRICA ) 150 MG capsule [id number] Yes Take 1 capsule (150 mg total) by mouth in the morning, at noon, and at bedtime. Willo Mini, NP  Active   vitamin E 1000 UNIT capsule [id number] Yes Take 1,000 Units by mouth daily. [provider]  Active Self             Home Care and Equipment/Supplies: Were Home Health Services Ordered?: NA Any new equipment or medical supplies ordered?: NA  Functional Questionnaire: Do you need assistance with bathing/showering or dressing?: Yes Do you need assistance with meal preparation?: Yes Do you need assistance with eating?: No Do you have difficulty maintaining continence: No Do you need assistance with getting out of bed/getting out of a chair/moving?: No Do you have difficulty managing or taking your medications?: No  Follow up appointments reviewed: PCP Follow-up appointment confirmed?: No MD Provider Line Number:[phone] Given: Yes (Patient cancelled [id number] appointment because of conflict with Rehab schedule. Patient stated she will call and reschedule) Specialist Hospital Follow-up appointment confirmed?: Yes Date of Specialist follow-up appointment?: 11/26/23 Follow-Up Specialty Provider:: Lynwood Calkins Do you need transportation to your follow-up appointment?: No Do you understand care options if your condition(s) worsen?: Yes-patient verbalized understanding  SDOH Interventions Today    Flowsheet Row Most Recent Value  SDOH Interventions   Food Insecurity Interventions Intervention Not Indicated  Housing Interventions Intervention Not Indicated  Transportation Interventions Intervention Not Indicated, Patient Resources (Friends/Family)  Utilities Interventions Intervention Not Indicated   The patient has been provided with contact information for the care management team and has been advised to call with any health-related questions or concerns. The patient verbalized understanding with current POC. The patient is directed to their insurance card regarding availability of benefits coverage  Cathlean Headland BSN RN [facility] Care Management Coordinator [email or web address] Direct Dial: [phone]  Fax: [phone] Website:  [email or web address]

## 2023-11-22 ENCOUNTER — Inpatient Hospital Stay: Admitting: Medical-Surgical

## 2023-11-22 DIAGNOSIS — M12811 Other specific arthropathies, not elsewhere classified, right shoulder: Secondary | ICD-10-CM | POA: Diagnosis not present

## 2023-11-22 DIAGNOSIS — R6889 Other general symptoms and signs: Secondary | ICD-10-CM | POA: Diagnosis not present

## 2023-11-22 DIAGNOSIS — M75101 Unspecified rotator cuff tear or rupture of right shoulder, not specified as traumatic: Secondary | ICD-10-CM | POA: Diagnosis not present

## 2023-11-26 ENCOUNTER — Encounter: Payer: Self-pay | Admitting: Medical-Surgical

## 2023-11-26 ENCOUNTER — Ambulatory Visit (INDEPENDENT_AMBULATORY_CARE_PROVIDER_SITE_OTHER): Admitting: Medical-Surgical

## 2023-11-26 VITALS — BP 105/72 | HR 87 | Resp 20 | Ht 63.0 in | Wt 220.0 lb

## 2023-11-26 DIAGNOSIS — Z09 Encounter for follow-up examination after completed treatment for conditions other than malignant neoplasm: Secondary | ICD-10-CM | POA: Diagnosis not present

## 2023-11-26 DIAGNOSIS — Z96611 Presence of right artificial shoulder joint: Secondary | ICD-10-CM | POA: Diagnosis not present

## 2023-11-26 NOTE — Progress Notes (Signed)
        Established patient visit  History, exam, impression, and plan:  1. Hospital discharge follow-up (Primary) Pleasant 68 year old female presenting today for hospital discharge follow-up after having a right reverse total shoulder replacement.  She has followed up with her orthopedic surgeon today and he removed the staples on the incision.  She has Steri-Strips in place now which are intact without any bleeding or other drainage.  She reports that she is doing very well overall.  She is in a sling at this point but reports that she will be taken out of it this week when she goes to physical therapy.  Her pain is well-controlled and she feels comfortable getting around and doing things at home.  She has been going for periods without her sling while at home but does make sure she wears it out in public.  A couple of days ago, she had some choking on secretions where she was coughing.  She has used her inhaler which helped some.  Her significant other gave her a dose of prednisone  they have at home and reports this was extremely beneficial.  During her hospitalization, she was treated for pneumonia after a chest x-ray showed atelectasis versus infiltrate.  She was sent home on Augmentin  and azithromycin  which have both been completed.  Outside of a productive cough, no other concerning symptoms such as fever, chills, chest pain, lethargy, or myalgias.  On review of her hospital records, her hemoglobin was low at 9.5 on discharge.  She denies any lightheadedness/dizziness or orthostasis.  On exam, HRR, S1/S2 normal.  Lungs CTA with even and unlabored respirations.  Plan to continue following up with physical therapy and Dr. Starman as directed.  Will plan for chest x-ray to follow-up on pneumonia for clearance in another 2 weeks.  When she returns for her x-ray would like to recheck her CBC to ensure that her anemia is resolving. - DG Chest 2 View; Future - CBC   Procedures performed this  visit: None.  Return in about 4 months (around 03/28/2024) for chronic disease follow up.  __________________________________ Zada FREDRIK Palin, DNP, APRN, FNP-BC Primary Care and Sports Medicine [facility] [place]

## 2023-11-29 DIAGNOSIS — M12811 Other specific arthropathies, not elsewhere classified, right shoulder: Secondary | ICD-10-CM | POA: Diagnosis not present

## 2023-11-29 DIAGNOSIS — R6889 Other general symptoms and signs: Secondary | ICD-10-CM | POA: Diagnosis not present

## 2023-11-29 DIAGNOSIS — M75101 Unspecified rotator cuff tear or rupture of right shoulder, not specified as traumatic: Secondary | ICD-10-CM | POA: Diagnosis not present

## 2023-12-04 DIAGNOSIS — R6889 Other general symptoms and signs: Secondary | ICD-10-CM | POA: Diagnosis not present

## 2023-12-04 DIAGNOSIS — M75101 Unspecified rotator cuff tear or rupture of right shoulder, not specified as traumatic: Secondary | ICD-10-CM | POA: Diagnosis not present

## 2023-12-04 DIAGNOSIS — M12811 Other specific arthropathies, not elsewhere classified, right shoulder: Secondary | ICD-10-CM | POA: Diagnosis not present

## 2023-12-13 ENCOUNTER — Ambulatory Visit

## 2023-12-13 DIAGNOSIS — Z96611 Presence of right artificial shoulder joint: Secondary | ICD-10-CM | POA: Diagnosis not present

## 2023-12-13 DIAGNOSIS — Z8709 Personal history of other diseases of the respiratory system: Secondary | ICD-10-CM

## 2023-12-13 DIAGNOSIS — M12811 Other specific arthropathies, not elsewhere classified, right shoulder: Secondary | ICD-10-CM | POA: Diagnosis not present

## 2023-12-13 DIAGNOSIS — M75101 Unspecified rotator cuff tear or rupture of right shoulder, not specified as traumatic: Secondary | ICD-10-CM | POA: Diagnosis not present

## 2023-12-13 DIAGNOSIS — Z09 Encounter for follow-up examination after completed treatment for conditions other than malignant neoplasm: Secondary | ICD-10-CM | POA: Diagnosis not present

## 2023-12-13 DIAGNOSIS — R9389 Abnormal findings on diagnostic imaging of other specified body structures: Secondary | ICD-10-CM | POA: Diagnosis not present

## 2023-12-13 DIAGNOSIS — R6889 Other general symptoms and signs: Secondary | ICD-10-CM | POA: Diagnosis not present

## 2023-12-13 NOTE — Progress Notes (Signed)
 DAILY TREATMENT/ASSESSMENT NOTE  Patient Name:  Sukaina Toothaker Date of Birth:  [birth year] Today's Date:  December 13, 2023 Referring Provider: Inocencio Agent, MD Visit # : 4 Encounter date: 12/13/2023  Surgery: s/p R Rev TSA DOS:11/15/2023  Certification Period: 11/22/23 to 03/13/24   [facility] MCR due 12/23/2023   Precautions: rev TSA protocol - subscap precautions     ICD-10-CM  1. Right rotator cuff tear arthropathy  M75.101   M12.811  2. Decreased functional activity tolerance  R68.89      Subjective   Subjective: Pt reports the wall slides are getting better. Pain is overall lessening, but she must've slept wrong or something as her arm is a bit sore today.  Pain Level: Pain: 4/10; intermittent  Pain Location: Right shoulder Objective and Treatment  Objective Findings:   R shoulder PROM (at evaluation):  Flexion: 90 degrees  [place]: 90 degrees  ER@45 : 50 degrees IR@ 45: 50 degrees   R shoulder PROM (12/13/2023):  Flexion: 160 degrees  Scaption: 170 degrees  ER@45 : 70 degrees IR@ 45: 60 degrees   R shoulder AROM (12/13/2023) Flexion: 115 degrees Scaption: 100 degrees ER: 50 degrees   Interventions  CPT INTERVENTION EXS/ACTIVITY DESCRIPTION  THERAPEUTIC EXERCISE (02889) Purpose: To increase, ROM, muscle strength, flexibility, and endurance UBE for ROM x5 min, discussion of s/sx since last session SL ER 2x10 Shoulder abd 2x10 Shoulder flexion 2x10 Supine chest press 3x10 Supine circles CW/CCW 3x10 ea Supine serratus punches 3x10 Progression of HEP with review of handout  HELD TODAY: Supine cane AAROM chest press 1x10 Supine cane AAROM flexion 1x10 Pt education weaning from sling    NEUROMUSCULAR RE-EDUCATION (02887)  Purpose: To improve motor recruitment, coordination, proprioception, balance and posture Bent over ext 1x10 Bent over horiz abd 1x10 Bent over row 1x10  THERAPEUTIC ACTIVITY (97530) Purpose: To increase capacity for  []   lifting and carrying []  reaching []  pushing and pulling  []  squatting   []  transfers   GAIT TRAINING (02883) Purpose: To Improve [] biomechanics/sequencing [] stair climbing [] safe use of asst device [] train on Wt bearing status    GROUP THERAPY (97150)     # patients participated   mins of group activity   INDEPENDENT EXERCISE   Performed without direct 1:1 therapist contact, and not as part of group therapy.    MANUAL THERAPY (97140) Purpose: To improve  []  soft tissue mobility []  joint mobility []  decrease mm guarding []  decrease edema    MODALITIES Purpose:  []  promote tissue healing []  reduce mm spasm []  decr pain and swelling     Treatment Time & Charges  Today's Evaluation/Treatment: 0940 - 1018 Total Time: 38 min   Charges: Total Time Code Treatment Minutes: 38  PT Therapeutic Time Entry Therapeutic Exercise Time Entry: 30 Neuromuscular Re-education Time Entry: 8   Assessment  Assessment:  Pt being seen for a follow up s/p R reverse TSA. Pt is now 4 weeks post op, therefore continued with work on AROM exercises. Progressed to standing for flex/abd as well as added scapular strengthening to her routine. Improvements continue to be noted with function and active/passive mobility as well as overall pain. New additions given to add to HEP. Pt remains motivated to improve. Pt will continue to benefit from skilled therapy in order to address strength, ROM, activity tolerance, and overall function.   Plan  Cont POC 1-2x/week for 12-16 weeks    Suzen Odea, PTA 12/13/2023 10:20 AM  [id number]  REG [address] [place] [address] Dept: [phone] Dept Fax: [phone]

## 2023-12-14 ENCOUNTER — Ambulatory Visit: Payer: Self-pay | Admitting: Medical-Surgical

## 2023-12-14 LAB — CBC
Hematocrit: 39 % (ref 34.0–46.6)
Hemoglobin: 12.5 g/dL (ref 11.1–15.9)
MCH: 32.6 pg (ref 26.6–33.0)
MCHC: 32.1 g/dL (ref 31.5–35.7)
MCV: 102 fL — ABNORMAL HIGH (ref 79–97)
Platelets: 259 x10E3/uL (ref 150–450)
RBC: 3.84 x10E6/uL (ref 3.77–5.28)
RDW: 13.9 % (ref 11.7–15.4)
WBC: 4.4 x10E3/uL (ref 3.4–10.8)

## 2023-12-20 DIAGNOSIS — M12811 Other specific arthropathies, not elsewhere classified, right shoulder: Secondary | ICD-10-CM | POA: Diagnosis not present

## 2023-12-20 DIAGNOSIS — M75101 Unspecified rotator cuff tear or rupture of right shoulder, not specified as traumatic: Secondary | ICD-10-CM | POA: Diagnosis not present

## 2023-12-20 DIAGNOSIS — R6889 Other general symptoms and signs: Secondary | ICD-10-CM | POA: Diagnosis not present

## 2023-12-24 DIAGNOSIS — M12811 Other specific arthropathies, not elsewhere classified, right shoulder: Secondary | ICD-10-CM | POA: Diagnosis not present

## 2023-12-24 DIAGNOSIS — M75101 Unspecified rotator cuff tear or rupture of right shoulder, not specified as traumatic: Secondary | ICD-10-CM | POA: Diagnosis not present

## 2023-12-24 DIAGNOSIS — R6889 Other general symptoms and signs: Secondary | ICD-10-CM | POA: Diagnosis not present

## 2023-12-31 DIAGNOSIS — M12811 Other specific arthropathies, not elsewhere classified, right shoulder: Secondary | ICD-10-CM | POA: Diagnosis not present

## 2023-12-31 DIAGNOSIS — M75101 Unspecified rotator cuff tear or rupture of right shoulder, not specified as traumatic: Secondary | ICD-10-CM | POA: Diagnosis not present

## 2023-12-31 DIAGNOSIS — R6889 Other general symptoms and signs: Secondary | ICD-10-CM | POA: Diagnosis not present

## 2024-01-07 DIAGNOSIS — M75101 Unspecified rotator cuff tear or rupture of right shoulder, not specified as traumatic: Secondary | ICD-10-CM | POA: Diagnosis not present

## 2024-01-07 DIAGNOSIS — R6889 Other general symptoms and signs: Secondary | ICD-10-CM | POA: Diagnosis not present

## 2024-01-07 DIAGNOSIS — M12811 Other specific arthropathies, not elsewhere classified, right shoulder: Secondary | ICD-10-CM | POA: Diagnosis not present

## 2024-01-08 ENCOUNTER — Encounter: Payer: Self-pay | Admitting: Dermatology

## 2024-01-08 ENCOUNTER — Ambulatory Visit: Admitting: Dermatology

## 2024-01-08 VITALS — BP 83/74 | HR 110

## 2024-01-08 DIAGNOSIS — W908XXA Exposure to other nonionizing radiation, initial encounter: Secondary | ICD-10-CM

## 2024-01-08 DIAGNOSIS — D485 Neoplasm of uncertain behavior of skin: Secondary | ICD-10-CM

## 2024-01-08 DIAGNOSIS — L814 Other melanin hyperpigmentation: Secondary | ICD-10-CM | POA: Diagnosis not present

## 2024-01-08 DIAGNOSIS — C44310 Basal cell carcinoma of skin of unspecified parts of face: Secondary | ICD-10-CM | POA: Diagnosis not present

## 2024-01-08 DIAGNOSIS — L821 Other seborrheic keratosis: Secondary | ICD-10-CM

## 2024-01-08 DIAGNOSIS — L578 Other skin changes due to chronic exposure to nonionizing radiation: Secondary | ICD-10-CM | POA: Diagnosis not present

## 2024-01-08 DIAGNOSIS — C44519 Basal cell carcinoma of skin of other part of trunk: Secondary | ICD-10-CM | POA: Diagnosis not present

## 2024-01-08 DIAGNOSIS — Z1283 Encounter for screening for malignant neoplasm of skin: Secondary | ICD-10-CM

## 2024-01-08 DIAGNOSIS — Z85828 Personal history of other malignant neoplasm of skin: Secondary | ICD-10-CM

## 2024-01-08 DIAGNOSIS — L570 Actinic keratosis: Secondary | ICD-10-CM | POA: Diagnosis not present

## 2024-01-08 DIAGNOSIS — D1801 Hemangioma of skin and subcutaneous tissue: Secondary | ICD-10-CM

## 2024-01-08 DIAGNOSIS — D225 Melanocytic nevi of trunk: Secondary | ICD-10-CM

## 2024-01-08 DIAGNOSIS — D229 Melanocytic nevi, unspecified: Secondary | ICD-10-CM

## 2024-01-08 DIAGNOSIS — C44319 Basal cell carcinoma of skin of other parts of face: Secondary | ICD-10-CM | POA: Diagnosis not present

## 2024-01-08 MED ORDER — FLUOROURACIL 5 % EX CREA: TOPICAL_CREAM | Freq: Two times a day (BID) | CUTANEOUS | 2 refills | Status: AC

## 2024-01-08 NOTE — Progress Notes (Signed)
 New Patient Visit   Subjective  [name] is a 68 y.o. female who presents for the following: Skin Cancer Screening and Full Body Skin Exam  The patient presents for Total-Body Skin Exam (TBSE) for skin cancer screening and mole check. The patient has spots, moles and lesions to be evaluated, some may be new or changing.  Previous history of NSMCs.   The following portions of the chart were reviewed this encounter and updated as appropriate: medications, allergies, medical history  Review of Systems:  No other skin or systemic complaints except as noted in HPI or Assessment and Plan.  Objective  Well appearing patient in no apparent distress; mood and affect are within normal limits.  A full examination was performed including scalp, head, eyes, ears, nose, lips, neck, chest, axillae, abdomen, back, buttocks, bilateral upper extremities, bilateral lower extremities, hands, feet, fingers, toes, fingernails, and toenails. All findings within normal limits unless otherwise noted below.   Relevant physical exam findings are noted in the Assessment and Plan.  Left Upper Back 1.1 cm pink scaly plaque  Right Upper Back 1.3 pink scaly plaque  left lower back 0.9 cm tan brown speckled papule  Right Temple 0.8 cm pink firm nodule   Assessment & Plan   SKIN CANCER SCREENING PERFORMED TODAY.  ACTINIC DAMAGE - Chronic condition, secondary to cumulative UV/sun exposure - diffuse scaly erythematous macules with underlying dyspigmentation - Recommend daily broad spectrum sunscreen SPF 30+ to sun-exposed areas, reapply every 2 hours as needed.  - Staying in the shade or wearing long sleeves, sun glasses (UVA+UVB protection) and wide brim hats (4-inch brim around the entire circumference of the hat) are also recommended for sun protection.  - Call for new or changing lesions.  LENTIGINES, SEBORRHEIC KERATOSES, HEMANGIOMAS - Benign normal skin lesions - Benign-appearing - Call for  any changes  MELANOCYTIC NEVI - Tan-brown and/or pink-flesh-colored symmetric macules and papules - Benign appearing on exam today - Observation - Call clinic for new or changing moles - Recommend daily use of broad spectrum spf 30+ sunscreen to sun-exposed areas.   HISTORY OF BASAL CELL CARCINOMA OF THE SKIN - No evidence of recurrence today - Recommend regular full body skin exams - Recommend daily broad spectrum sunscreen SPF 30+ to sun-exposed areas, reapply every 2 hours as needed.  - Call if any new or changing lesions are noted between office visits   ACTINIC KERATOSIS Exam: Erythematous thin papules/macules with gritty scale at the chest  Actinic keratoses are precancerous spots that appear secondary to cumulative UV radiation exposure/sun exposure over time. They are chronic with expected duration over 1 year. A portion of actinic keratoses will progress to squamous cell carcinoma of the skin. It is not possible to reliably predict which spots will progress to skin cancer and so treatment is recommended to prevent development of skin cancer.  Recommend daily broad spectrum sunscreen SPF 30+ to sun-exposed areas, reapply every 2 hours as needed.  Recommend staying in the shade or wearing long sleeves, sun glasses (UVA+UVB protection) and wide brim hats (4-inch brim around the entire circumference of the hat). Call for new or changing lesions.  Treatment Plan: Start 5-fluorouracil  cream twice a day for 14 days to affected areas including chest.  Reviewed course of treatment and expected reaction.  Patient advised to expect inflammation and crusting and advised that erosions are possible.  Patient advised to be diligent with sun protection during and after treatment. Handout with details of how to  apply medication and what to expect provided. Counseled to keep medication out of reach of children and pets.  Reviewed course of treatment and expected reaction.  Patient advised to expect  inflammation and crusting and advised that erosions are possible.  Patient advised to be diligent with sun protection during and after treatment. Handout with details of how to apply medication and what to expect provided. Counseled to keep medication out of reach of children and pets.   AK (ACTINIC KERATOSIS) (4) Chest - Medial (Center), Left Breast, Left Eyebrow, Right Breast Apply Efudex  2x daily to the chest area for 14 days Destruction of lesion - Left Eyebrow Complexity: extensive   Destruction method: cryotherapy   Informed consent: discussed and consent obtained   Timeout:  patient name, date of birth, surgical site, and procedure verified Procedure prep:  Patient was prepped and draped in usual sterile fashion Lesion destroyed using liquid nitrogen: Yes   Region frozen until ice ball extended beyond lesion: Yes   Cryotherapy cycles:  2 Outcome: patient tolerated procedure well with no complications   Post-procedure details: wound care instructions given    Related Medications fluorouracil  (EFUDEX ) 5 % cream Apply topically 2 (two) times daily. NEOPLASM OF UNCERTAIN BEHAVIOR OF SKIN (4) Left Upper Back Skin / nail biopsy Type of biopsy: tangential   Informed consent: discussed and consent obtained   Timeout: patient name, date of birth, surgical site, and procedure verified   Procedure prep:  Patient was prepped and draped in usual sterile fashion Prep type:  Isopropyl alcohol Anesthesia: the lesion was anesthetized in a standard fashion   Anesthetic:  1% lidocaine  w/ epinephrine  1-100,000 buffered w/ 8.4% NaHCO3 Instrument used: DermaBlade   Hemostasis achieved with: aluminum chloride   Outcome: patient tolerated procedure well   Post-procedure details: sterile dressing applied and wound care instructions given   Dressing type: petrolatum gauze and bandage    Specimen 1 - Surgical pathology Differential Diagnosis: r/o NMSC vs other  Check Margins: No Right Upper  Back Skin / nail biopsy Type of biopsy: tangential   Informed consent: discussed and consent obtained   Timeout: patient name, date of birth, surgical site, and procedure verified   Procedure prep:  Patient was prepped and draped in usual sterile fashion Prep type:  Isopropyl alcohol Anesthesia: the lesion was anesthetized in a standard fashion   Anesthetic:  1% lidocaine  w/ epinephrine  1-100,000 buffered w/ 8.4% NaHCO3 Instrument used: DermaBlade   Hemostasis achieved with: aluminum chloride   Outcome: patient tolerated procedure well   Post-procedure details: sterile dressing applied and wound care instructions given   Dressing type: petrolatum gauze and bandage    Specimen 2 - Surgical pathology Differential Diagnosis:  r/o NMSC vs other  Check Margins: No left lower back Skin / nail biopsy Type of biopsy: tangential   Informed consent: discussed and consent obtained   Timeout: patient name, date of birth, surgical site, and procedure verified   Procedure prep:  Patient was prepped and draped in usual sterile fashion Prep type:  Isopropyl alcohol Anesthesia: the lesion was anesthetized in a standard fashion   Anesthetic:  1% lidocaine  w/ epinephrine  1-100,000 buffered w/ 8.4% NaHCO3 Instrument used: DermaBlade   Hemostasis achieved with: aluminum chloride   Outcome: patient tolerated procedure well   Post-procedure details: sterile dressing applied and wound care instructions given   Dressing type: petrolatum gauze and bandage    Specimen 3 - Surgical pathology Differential Diagnosis:  r/o NMSC vs other  Check Margins:  No Right Temple Skin / nail biopsy Type of biopsy: tangential   Informed consent: discussed and consent obtained   Timeout: patient name, date of birth, surgical site, and procedure verified   Procedure prep:  Patient was prepped and draped in usual sterile fashion Prep type:  Isopropyl alcohol Anesthesia: the lesion was anesthetized in a standard  fashion   Anesthetic:  1% lidocaine  w/ epinephrine  1-100,000 buffered w/ 8.4% NaHCO3 Instrument used: DermaBlade   Hemostasis achieved with: aluminum chloride   Outcome: patient tolerated procedure well   Post-procedure details: sterile dressing applied and wound care instructions given   Dressing type: petrolatum gauze and bandage    Specimen 4 - Surgical pathology Differential Diagnosis:  r/o NMSC vs other  Check Margins: No  Return in about 3 months (around 04/09/2024) for TBSC.  I, Berwyn Lesches, Surg Tech III, am acting as scribe for [name], MD.   Documentation: I have reviewed the above documentation for accuracy and completeness, and I agree with the above.  [name], MD

## 2024-01-08 NOTE — Patient Instructions (Addendum)
 Patient Handout: Wound Care for Skin Biopsy Site  Taking Care of Your Skin Biopsy Site  Proper care of the biopsy site is essential for promoting healing and minimizing scarring. This handout provides instructions on how to care for your biopsy site to ensure optimal recovery.  1. Cleaning the Wound:  Clean the biopsy site daily with gentle soap and water. Gently pat the area dry with a clean, soft towel. Avoid harsh scrubbing or rubbing the area, as this can irritate the skin and delay healing.  2. Applying Aquaphor and Bandage:  After cleaning the wound, apply a thin layer of Aquaphor ointment to the biopsy site. Cover the area with a sterile bandage to protect it from dirt, bacteria, and friction. Change the bandage daily or as needed if it becomes soiled or wet.  3. Continued Care for One Week:  Repeat the cleaning, Aquaphor application, and bandaging process daily for one week following the biopsy procedure. Keeping the wound clean and moist during this initial healing period will help prevent infection and promote optimal healing.  4. Massaging Aquaphor into the Area:  ---After one week, discontinue the use of bandages but continue to apply Aquaphor to the biopsy site. ----Gently massage the Aquaphor into the area using circular motions. ---Massaging the skin helps to promote circulation and prevent the formation of scar tissue.   Additional Tips:  Avoid exposing the biopsy site to direct sunlight during the healing process, as this can cause hyperpigmentation or worsen scarring. If you experience any signs of infection, such as increased redness, swelling, warmth, or drainage from the wound, contact your healthcare provider immediately. Follow any additional instructions provided by your healthcare provider for caring for the biopsy site and managing any discomfort. Conclusion:  Taking proper care of your skin biopsy site is crucial for ensuring optimal healing and  minimizing scarring. By following these instructions for cleaning, applying Aquaphor, and massaging the area, you can promote a smooth and successful recovery. If you have any questions or concerns about caring for your biopsy site, don't hesitate to contact your healthcare provider for guidance.  Liquid nitrogen was applied for 10-12 seconds to the skin lesion and the expected blistering or scabbing reaction explained. Do not pick at the area. Patient reminded to expect hypopigmented scars from the procedure. Return if lesion fails to fully resolve. For areas treated with Liquid Nitrogen:  Keep clean with soap and water.  Apply Vaseline or Aquaphor twice daily. For areas treated with Liquid Nitrogen:  Keep clean with soap and water.  Apply Vaseline or Aquaphor twice daily.  Important Information  Due to recent changes in healthcare laws, you may see results of your pathology and/or laboratory studies on MyChart before the doctors have had a chance to review them. We understand that in some cases there may be results that are confusing or concerning to you. Please understand that not all results are received at the same time and often the doctors may need to interpret multiple results in order to provide you with the best plan of care or course of treatment. Therefore, we ask that you please give us  2 business days to thoroughly review all your results before contacting the office for clarification. Should we see a critical lab result, you will be contacted sooner.   If You Need Anything After Your Visit  If you have any questions or concerns for your doctor, please call our main line at [phone] If no one answers, please leave a voicemail as  directed and we will return your call as soon as possible. Messages left after 4 pm will be answered the following business day.   You may also send us  a message via MyChart. We typically respond to MyChart messages within 1-2 business days.  For  prescription refills, please ask your pharmacy to contact our office. Our fax number is [phone].  If you have an urgent issue when the clinic is closed that cannot wait until the next business day, you can page your doctor at the number below.    Please note that while we do our best to be available for urgent issues outside of office hours, we are not available 24/7.   If you have an urgent issue and are unable to reach us , you may choose to seek medical care at your doctor's office, retail clinic, urgent care center, or emergency room.  If you have a medical emergency, please immediately call 911 or go to the emergency department. In the event of inclement weather, please call our main line at [phone] for an update on the status of any delays or closures.  Dermatology Medication Tips: Please keep the boxes that topical medications come in in order to help keep track of the instructions about where and how to use these. Pharmacies typically print the medication instructions only on the boxes and not directly on the medication tubes.   If your medication is too expensive, please contact our office at [phone] or send us  a message through MyChart.   We are unable to tell what your co-pay for medications will be in advance as this is different depending on your insurance coverage. However, we may be able to find a substitute medication at lower cost or fill out paperwork to get insurance to cover a needed medication.   If a prior authorization is required to get your medication covered by your insurance company, please allow us  1-2 business days to complete this process.  Drug prices often vary depending on where the prescription is filled and some pharmacies may offer cheaper prices.  The website www.goodrx.com contains coupons for medications through different pharmacies. The prices here do not account for what the cost may be with help from insurance (it may be cheaper with your  insurance), but the website can give you the price if you did not use any insurance.  - You can print the associated coupon and take it with your prescription to the pharmacy.  - You may also stop by our office during regular business hours and pick up a GoodRx coupon card.  - If you need your prescription sent electronically to a different pharmacy, notify our office through [facility] or by phone at [phone]    Skin Education :   I counseled the patient regarding the following: Sun screen (SPF 30 or greater) should be applied during peak UV exposure (between 10am and 2pm) and reapplied after exercise or swimming.  The ABCDEs of melanoma were reviewed with the patient, and the importance of monthly self-examination of moles was emphasized. Should any moles change in shape or color, or itch, bleed or burn, pt will contact our office for evaluation sooner then their interval appointment.  Plan: Sunscreen Recommendations I recommended a broad spectrum sunscreen with a SPF of 30 or higher. I explained that SPF 30 sunscreens block approximately 97 percent of the sun's harmful rays. Sunscreens should be applied at least 15 minutes prior to expected sun exposure and then every 2 hours after  that as long as sun exposure continues. If swimming or exercising sunscreen should be reapplied every 45 minutes to an hour after getting wet or sweating. One ounce, or the equivalent of a shot glass full of sunscreen, is adequate to protect the skin not covered by a bathing suit. I also recommended a lip balm with a sunscreen as well. Sun protective clothing can be used in lieu of sunscreen but must be worn the entire time you are exposed to the sun's rays.  Patient Handout: Wound Care for Skin Biopsy Site  Taking Care of Your Skin Biopsy Site  Proper care of the biopsy site is essential for promoting healing and minimizing scarring. This handout provides instructions on how to care for your biopsy  site to ensure optimal recovery.  1. Cleaning the Wound:  Clean the biopsy site daily with gentle soap and water. Gently pat the area dry with a clean, soft towel. Avoid harsh scrubbing or rubbing the area, as this can irritate the skin and delay healing.  2. Applying Aquaphor and Bandage:  After cleaning the wound, apply a thin layer of Aquaphor ointment to the biopsy site. Cover the area with a sterile bandage to protect it from dirt, bacteria, and friction. Change the bandage daily or as needed if it becomes soiled or wet.  3. Continued Care for One Week:  Repeat the cleaning, Aquaphor application, and bandaging process daily for one week following the biopsy procedure. Keeping the wound clean and moist during this initial healing period will help prevent infection and promote optimal healing.  4. Massaging Aquaphor into the Area:  ---After one week, discontinue the use of bandages but continue to apply Aquaphor to the biopsy site. ----Gently massage the Aquaphor into the area using circular motions. ---Massaging the skin helps to promote circulation and prevent the formation of scar tissue.   Additional Tips:  Avoid exposing the biopsy site to direct sunlight during the healing process, as this can cause hyperpigmentation or worsen scarring. If you experience any signs of infection, such as increased redness, swelling, warmth, or drainage from the wound, contact your healthcare provider immediately. Follow any additional instructions provided by your healthcare provider for caring for the biopsy site and managing any discomfort. Conclusion:  Taking proper care of your skin biopsy site is crucial for ensuring optimal healing and minimizing scarring. By following these instructions for cleaning, applying Aquaphor, and massaging the area, you can promote a smooth and successful recovery. If you have any questions or concerns about caring for your biopsy site, don't hesitate to  contact your healthcare provider for guidance.

## 2024-01-09 LAB — SURGICAL PATHOLOGY

## 2024-01-10 ENCOUNTER — Ambulatory Visit: Payer: Self-pay | Admitting: Dermatology

## 2024-01-12 ENCOUNTER — Other Ambulatory Visit: Payer: Self-pay | Admitting: Medical-Surgical

## 2024-01-16 DIAGNOSIS — M19072 Primary osteoarthritis, left ankle and foot: Secondary | ICD-10-CM | POA: Diagnosis not present

## 2024-01-16 DIAGNOSIS — M19071 Primary osteoarthritis, right ankle and foot: Secondary | ICD-10-CM | POA: Diagnosis not present

## 2024-02-04 ENCOUNTER — Encounter: Payer: Self-pay | Admitting: Dermatology

## 2024-02-04 DIAGNOSIS — H353132 Nonexudative age-related macular degeneration, bilateral, intermediate dry stage: Secondary | ICD-10-CM | POA: Diagnosis not present

## 2024-02-06 ENCOUNTER — Ambulatory Visit: Admitting: Dermatology

## 2024-02-06 ENCOUNTER — Encounter: Payer: Self-pay | Admitting: Dermatology

## 2024-02-06 VITALS — BP 144/89 | HR 89 | Temp 98.7°F

## 2024-02-06 DIAGNOSIS — C44319 Basal cell carcinoma of skin of other parts of face: Secondary | ICD-10-CM

## 2024-02-06 DIAGNOSIS — L578 Other skin changes due to chronic exposure to nonionizing radiation: Secondary | ICD-10-CM

## 2024-02-06 DIAGNOSIS — C4491 Basal cell carcinoma of skin, unspecified: Secondary | ICD-10-CM

## 2024-02-06 DIAGNOSIS — L814 Other melanin hyperpigmentation: Secondary | ICD-10-CM | POA: Diagnosis not present

## 2024-02-06 NOTE — Progress Notes (Signed)
 Follow-Up Visit   Subjective  [name] is a 68 y.o. female who presents for the following: Mohs of Nodular and Infiltrative Basal Cell Carcinoma on the right temple, biopsied by Dr. Corey  The following portions of the chart were reviewed this encounter and updated as appropriate: medications, allergies, medical history  Review of Systems:  No other skin or systemic complaints except as noted in HPI or Assessment and Plan.  Objective  Well appearing patient in no apparent distress; mood and affect are within normal limits.  A focused examination was performed of the following areas: Right temple Relevant physical exam findings are noted in the Assessment and Plan.   Right Temple Pink pearly papule or plaque with arborizing vessels.    Assessment & Plan   BASAL CELL CARCINOMA (BCC), UNSPECIFIED SITE Right Temple Mohs surgery  Consent obtained: written  Anticoagulation: Is the patient taking prescription anticoagulant and/or aspirin prescribed/recommended by a physician? No   Was the anticoagulation regimen changed prior to Mohs? No    Anesthesia: Anesthesia method: local infiltration Local anesthetic: lidocaine  1% WITH epi  Procedure Details: Timeout: pre-procedure verification complete Procedure Prep: patient was prepped and draped in usual sterile fashion Prep type: chlorhexidine  Biopsy accession number: [id number] Frozen section biopsy performed: No   Specimen debulked: Yes   Pre-Op diagnosis: basal cell carcinoma BCC subtype: nodular and infiltrative MohsAIQ Surgical site (if tumor spans multiple areas, please select predominant area): temple Surgery side: right Surgical site (from skin exam): Right Temple Pre-operative length (cm): 1.1 Pre-operative width (cm): 1.2 Indications for Mohs surgery: anatomic location where tissue conservation is critical Previously treated? No    Micrographic Surgery Details: Post-operative length (cm):  2.2 Post-operative width (cm): 2 Number of Mohs stages: 2 Cumulative additional sections past 5 per stage: 0 Post surgery depth of defect: skeletal muscle Is this a complex case (associate members only): No    Stage 1    Tumor features identified on Mohs section: basal carcinoma    Depth of tumor invasion after stage: skeletal muscle    Perineural invasion: no perineural invasion  Stage 2    Tumor features identified on Mohs section: no tumor identified    Depth of tumor invasion after stage: skeletal muscle    Perineural invasion: no perineural invasion  Patient tolerance of procedure: tolerated well, no immediate complications  Reconstruction: Was the defect reconstructed? Yes   Was reconstruction performed by the same Mohs surgeon? Yes   Setting of reconstruction: outpatient office When was reconstruction performed? same day Type of reconstruction: linear Linear reconstruction: complex  Opioids: Did the patient receive a prescription for opioid/narcotic related to Mohs surgery?: No    Antibiotics: Does patient meet AHA guidelines for endocarditis?: No   Does patient meet AHA guidelines for orthopedic prophylaxis?: No   Were antibiotics given on the day of surgery?: No   Did surgery breach mucosa, expose cartilage/bone, involve an area of lymphedema/inflamed/infected tissue? No    Skin repair Complexity:  Complex Final length (cm):  5.2 Informed consent: discussed and consent obtained   Timeout: patient name, date of birth, surgical site, and procedure verified   Procedure prep:  Patient was prepped and draped in usual sterile fashion Prep type:  Chlorhexidine  Anesthesia: the lesion was anesthetized in a standard fashion   Anesthetic:  1% lidocaine  w/ epinephrine  1-100,000 buffered w/ 8.4% NaHCO3 Reason for type of repair: reduce tension to allow closure, preserve normal anatomy, avoid adjacent structures and allow side-to-side closure without requiring  a flap or graft    Undermining: area extensively undermined   Subcutaneous layers (deep stitches):  Suture size:  4-0 Suture type: Vicryl (polyglactin 910)   Stitches:  Buried vertical mattress Fine/surface layer approximation (top stitches):  Suture size:  6-0 Suture type: fast-absorbing plain gut   Stitches: simple running   Hemostasis achieved with: suture, pressure and electrodesiccation Outcome: patient tolerated procedure well with no complications   Post-procedure details: sterile dressing applied and wound care instructions given   Dressing type: bandage, petrolatum and pressure dressing      Return in about 4 weeks (around 03/05/2024) for mohs follow up.  [name], Surg Tech III, am acting as scribe for [name], MD.    02/06/2024  HISTORY OF PRESENT ILLNESS  [name] is seen in consultation at the request of Dr. HOLY for biopsy-proven Nodular and Infiltrative Basal Cell Carcinoma on the right temple. They note that the area has been present for about 12 months increasing in size with time.  There is no history of previous treatment.  Reports no other new or changing lesions and has no other complaints today.  Medications and allergies: see patient chart.  Review of systems: Reviewed 8 systems and notable for the above skin cancer.  All other systems reviewed are unremarkable/negative, unless noted in the HPI. Past medical history, surgical history, family history, social history were also reviewed and are noted in the chart/questionnaire.    PHYSICAL EXAMINATION  General: Well-appearing, in no acute distress, alert and oriented x 4. Vitals reviewed in chart (if available).   Skin: Exam reveals a 1.1 x 1.2 cm erythematous papule and biopsy scar on the right temple. There are rhytids, telangiectasias, and lentigines, consistent with photodamage.  Biopsy report(s) reviewed, confirming the diagnosis.   ASSESSMENT  1) Nodular and Infiltrative Basal Cell Carcinoma on the  right temple 2) photodamage 3) solar lentigines   PLAN   1. Due to location, size, histology, or recurrence and the likelihood of subclinical extension as well as the need to conserve normal surrounding tissue, the patient was deemed acceptable for Mohs micrographic surgery (MMS).  The nature and purpose of the procedure, associated benefits and risks including recurrence and scarring, possible complications such as pain, infection, and bleeding, and alternative methods of treatment if appropriate were discussed with the patient during consent. The lesion location was verified by the patient, by reviewing previous notes, pathology reports, and by photographs as well as angulation measurements if available.  Informed consent was reviewed and signed by the patient, and timeout was performed at 9:00 AM. See op note below.  2. For the photodamage and solar lentigines, sun protection discussed/information given on OTC sunscreens, and we recommend continued regular follow-up with primary dermatologist every 6 months or sooner for any growing, bleeding, or changing lesions. 3. Prognosis and future surveillance discussed. 4. Letter with treatment outcome sent to referring provider. 5. Pain acetaminophen /ibuprofen   MOHS MICROGRAPHIC SURGERY AND RECONSTRUCTION  Initial size:   1.1 x 1.2 cm Surgical defect/wound size: 2.2 x 2.0 cm Anesthesia:    0.33% lidocaine  with 1:200,000 epinephrine  EBL:    <5 mL Complications:  None Repair type:   Complex SQ suture:   4-0 Vicryl Cutaneous suture:  6-0 Plain gut Final size of the repair: 5.2 cm  Stages: 2  STAGE I: Anesthesia achieved with 0.5% lidocaine  with 1:200,000 epinephrine . ChloraPrep applied. 1 section(s) excised using Mohs technique (this includes total peripheral and deep tissue margin excision and  evaluation with frozen sections, excised and interpreted by the same physician). The tumor was first debulked and then excised with an approx. 2 mm margin.   Hemostasis was achieved with electrocautery as needed.  The specimen was then oriented, subdivided/relaxed, inked, and processed using Mohs technique.    Frozen section analysis revealed a positive margin for sclerosing (morphea-like) subtype is composed of spiky, basaloid, thin strands of cells that invade the dermis, surrounded by dense fibrous stroma in the deep margin.    STAGE II: An additional 2 mm margin was excised.  Hemostasis was achieved with electrocautery as needed.  The specimen was then oriented, subdivided/relaxed, inked, and processed using Mohs technique. Evaluation of slides by the Mohs surgeon revealed clear tumor margins.   Reconstruction  The surgical wound was then cleaned, prepped, and re-anesthetized as above. Wound edges were undermined extensively along at least one entire edge and at a distance equal to or greater than the width of the defect (see wound defect size above) in order to achieve closure and decrease wound tension and anatomic distortion. Redundant tissue repair including standing cone removal was performed. Hemostasis was achieved with electrocautery. Subcutaneous and epidermal tissues were approximated with the above sutures. The surgical site was then lightly scrubbed with sterile, saline-soaked gauze. The area was then bandaged using Vaseline ointment, non-adherent gauze, gauze pads, and tape to provide an adequate pressure dressing. The patient tolerated the procedure well, was given detailed written and verbal wound care instructions, and was discharged in good condition.   The patient will follow-up: 4 weeks.    Documentation: I have reviewed the above documentation for accuracy and completeness, and I agree with the above.  [name], MD

## 2024-02-06 NOTE — Patient Instructions (Signed)

## 2024-02-14 ENCOUNTER — Encounter: Payer: Self-pay | Admitting: Dermatology

## 2024-02-27 ENCOUNTER — Other Ambulatory Visit: Payer: Self-pay | Admitting: Medical-Surgical

## 2024-02-27 ENCOUNTER — Encounter: Payer: Self-pay | Admitting: Dermatology

## 2024-02-27 ENCOUNTER — Other Ambulatory Visit: Payer: Self-pay

## 2024-02-28 ENCOUNTER — Other Ambulatory Visit: Payer: Self-pay | Admitting: Medical-Surgical

## 2024-02-28 DIAGNOSIS — K219 Gastro-esophageal reflux disease without esophagitis: Secondary | ICD-10-CM

## 2024-02-28 DIAGNOSIS — Z780 Asymptomatic menopausal state: Secondary | ICD-10-CM

## 2024-02-28 DIAGNOSIS — F419 Anxiety disorder, unspecified: Secondary | ICD-10-CM

## 2024-03-04 ENCOUNTER — Ambulatory Visit: Admitting: Dermatology

## 2024-03-04 ENCOUNTER — Encounter: Payer: Self-pay | Admitting: Dermatology

## 2024-03-04 ENCOUNTER — Other Ambulatory Visit: Payer: Self-pay | Admitting: Medical Genetics

## 2024-03-04 VITALS — BP 152/93 | HR 110

## 2024-03-04 DIAGNOSIS — Z85828 Personal history of other malignant neoplasm of skin: Secondary | ICD-10-CM

## 2024-03-04 DIAGNOSIS — L905 Scar conditions and fibrosis of skin: Secondary | ICD-10-CM | POA: Diagnosis not present

## 2024-03-04 DIAGNOSIS — C44519 Basal cell carcinoma of skin of other part of trunk: Secondary | ICD-10-CM | POA: Diagnosis not present

## 2024-03-04 DIAGNOSIS — C4491 Basal cell carcinoma of skin, unspecified: Secondary | ICD-10-CM

## 2024-03-04 NOTE — Progress Notes (Signed)
 Follow-Up Visit   Subjective  [name] is a 68 y.o. female who presents for the following: Excision of a Superficial and Nodular Basal Cell Carcinoma on the right upper back, biopsied by Dr. Corey.  She is s/p Mohs for a BCC on the right temple, treated on 02/06/2024, repaired with linear closure.  The following portions of the chart were reviewed this encounter and updated as appropriate: medications, allergies, medical history  Review of Systems:  No other skin or systemic complaints except as noted in HPI or Assessment and Plan.  Objective  Well appearing patient in no apparent distress; mood and affect are within normal limits.  A focused examination was performed of the following areas: Right upper back Relevant physical exam findings are noted in the Assessment and Plan.               Right Upper Back Pink pearly papule or plaque with arborizing vessels.    Assessment & Plan   BASAL CELL CARCINOMA (BCC), UNSPECIFIED SITE Right Upper Back Skin excision  Excision method:  elliptical Lesion length (cm):  2.1 Lesion width (cm):  1.1 Margin per side (cm):  0.5 Total excision diameter (cm):  3.1 Informed consent: discussed and consent obtained   Timeout: patient name, date of birth, surgical site, and procedure verified   Procedure prep:  Patient was prepped and draped in usual sterile fashion Prep type:  Chlorhexidine  Anesthesia: the lesion was anesthetized in a standard fashion   Anesthetic:  1% lidocaine  w/ epinephrine  1-100,000 buffered w/ 8.4% NaHCO3 Instrument used: #15 blade   Hemostasis achieved with: suture, pressure and electrodesiccation   Outcome: patient tolerated procedure well with no complications   Post-procedure details: sterile dressing applied and wound care instructions given   Dressing type: bandage and pressure dressing    Skin repair Complexity:  Complex Final length (cm):  5.5 Informed consent: discussed and consent obtained    Timeout: patient name, date of birth, surgical site, and procedure verified   Procedure prep:  Patient was prepped and draped in usual sterile fashion Prep type:  Chlorhexidine  Anesthesia: the lesion was anesthetized in a standard fashion   Anesthetic:  1% lidocaine  w/ epinephrine  1-100,000 buffered w/ 8.4% NaHCO3 Reason for type of repair: allow closure of the large defect, preserve normal anatomy, preserve normal anatomical and functional relationships and avoid adjacent structures   Undermining: area extensively undermined   Subcutaneous layers (deep stitches):  Suture size:  3-0 Suture type: PDS (polydioxanone)   Stitches:  Buried vertical mattress Fine/surface layer approximation (top stitches):  Suture type: cyanoacrylate tissue glue   Hemostasis achieved with: suture, pressure and electrodesiccation Outcome: patient tolerated procedure well with no complications   Post-procedure details: sterile dressing applied and wound care instructions given   Dressing type: pressure dressing and bandage (Steri Strips applied)    Specimen 1 - Surgical pathology Differential Diagnosis: BCC  [phone] Check Margins: No SCAR    The surgical wound was then cleaned, prepped, and re-anesthetized as above. Wound edges were undermined extensively along at least one entire edge and at a distance equal to or greater than the width of the defect (see wound defect size above) in order to achieve closure and decrease wound tension and anatomic distortion. Redundant tissue repair including standing cone removal was performed. Hemostasis was achieved with electrocautery. Subcutaneous and epidermal tissues were approximated with the above sutures. The surgical site was then lightly scrubbed with sterile, saline-soaked gauze. Steri-strips were applied, and the area  was then bandaged using Vaseline ointment, non-adherent gauze, gauze pads, and tape to provide an adequate pressure dressing. The patient  tolerated the procedure well, was given detailed written and verbal wound care instructions, and was discharged in good condition.   The patient will follow-up: for next surgery.  Scar s/p Mohs for a BCC on the Right Temple, treated by 02/06/2024, healing by linear closure - No evidence of recurrence today - No erythema, swelling, or dehiscence  - Recommend regular full body skin exams - Recommend daily broad spectrum sunscreen SPF 30+ to sun-exposed areas, reapply every 2 hours as needed.  - Call if any new or changing lesions are noted between office visits  HISTORY OF BASAL CELL CARCINOMA OF THE SKIN - No evidence of recurrence today - Recommend regular full body skin exams - Recommend daily broad spectrum sunscreen SPF 30+ to sun-exposed areas, reapply every 2 hours as needed.  - Call if any new or changing lesions are noted between office visits   Return for Scheduled Appt.  I, Jetta Ager, am acting as scribe for Karina CHRISTELLA HOLY, MD.  Documentation: I have reviewed the above documentation for accuracy and completeness, and I agree with the above.  Karina CHRISTELLA HOLY, MD

## 2024-03-04 NOTE — Patient Instructions (Addendum)

## 2024-03-05 DIAGNOSIS — H353132 Nonexudative age-related macular degeneration, bilateral, intermediate dry stage: Secondary | ICD-10-CM | POA: Diagnosis not present

## 2024-03-05 LAB — SURGICAL PATHOLOGY

## 2024-03-06 ENCOUNTER — Ambulatory Visit: Payer: Self-pay | Admitting: Dermatology

## 2024-03-07 ENCOUNTER — Telehealth: Payer: Self-pay | Admitting: Medical-Surgical

## 2024-03-07 NOTE — Telephone Encounter (Signed)
 Copied from CRM #[id number]. Topic: Appointments - Scheduling Inquiry for Clinic >> Mar 07, 2024 10:55 AM Antwanette L wrote: Reason for CRM: The patient would like to schedule a mammogram. It has been a while since their last one. The patient is requesting a callback at [phone]  Called patient advised to let Zada know that she needs a mammogram when she comes in for her appointment on 03-10-24

## 2024-03-10 ENCOUNTER — Encounter: Payer: Self-pay | Admitting: Medical-Surgical

## 2024-03-10 ENCOUNTER — Ambulatory Visit (INDEPENDENT_AMBULATORY_CARE_PROVIDER_SITE_OTHER): Admitting: Medical-Surgical

## 2024-03-10 VITALS — BP 123/85 | HR 82 | Resp 20 | Ht 63.0 in | Wt 225.0 lb

## 2024-03-10 DIAGNOSIS — M19072 Primary osteoarthritis, left ankle and foot: Secondary | ICD-10-CM

## 2024-03-10 DIAGNOSIS — M19071 Primary osteoarthritis, right ankle and foot: Secondary | ICD-10-CM | POA: Diagnosis not present

## 2024-03-10 DIAGNOSIS — Z1231 Encounter for screening mammogram for malignant neoplasm of breast: Secondary | ICD-10-CM

## 2024-03-10 DIAGNOSIS — E782 Mixed hyperlipidemia: Secondary | ICD-10-CM | POA: Diagnosis not present

## 2024-03-10 DIAGNOSIS — K219 Gastro-esophageal reflux disease without esophagitis: Secondary | ICD-10-CM

## 2024-03-10 DIAGNOSIS — F419 Anxiety disorder, unspecified: Secondary | ICD-10-CM | POA: Diagnosis not present

## 2024-03-10 DIAGNOSIS — F1010 Alcohol abuse, uncomplicated: Secondary | ICD-10-CM

## 2024-03-10 DIAGNOSIS — F3341 Major depressive disorder, recurrent, in partial remission: Secondary | ICD-10-CM

## 2024-03-10 DIAGNOSIS — I10 Essential (primary) hypertension: Secondary | ICD-10-CM | POA: Diagnosis not present

## 2024-03-10 MED ORDER — LISINOPRIL 10 MG PO TABS: 10.0000 mg | ORAL_TABLET | Freq: Every day | ORAL | 3 refills | Status: AC

## 2024-03-10 MED ORDER — NALTREXONE HCL 50 MG PO TABS: 50.0000 mg | ORAL_TABLET | Freq: Every day | ORAL | 1 refills | Status: AC

## 2024-03-10 NOTE — Progress Notes (Unsigned)
 Established patient visit   History of Present Illness   Discussed the use of AI scribe software for clinical note transcription with the patient, who gave verbal consent to proceed.  History of Present Illness   [name] is a 68 year old female who presents with foot pain and increased alcohol use.  Chronic foot pain - Chronic foot pain with progressive worsening over time - Pain is severe and limits ambulation, especially after activities such as shopping - Sensation of bones moving and audible sounds in the feet - Injections provide relief for approximately one month - Alternates between Celebrex  200 mg twice daily and meloxicam  for pain management - No current use of pain medication other than Tylenol  and meloxicam   Alcohol use - Increased alcohol consumption, currently drinking a fifth of vodka every ten days - Consumes two to three mixed drinks nightly - Attributes increased use to self-medication due to inability to engage in activities and recent family stressors - Expresses concern about drinking becoming habitual - Interested in medication to help reduce alcohol intake - Currently managing care of sister with end-stage liver disease due to alcoholism  Psychosocial stressors - Recent loss of nephew - Daughter-in-law diagnosed with breast cancer - Managing care of sister with end-stage liver disease - Expresses increased stress related to family circumstances  Weight management and joint pressure - Personal history of weight issues - Desires weight loss to alleviate joint pressure - Family history of diabetes    Physical Exam   Physical Exam Vitals reviewed.  Constitutional:      General: She is not in acute distress.    Appearance: Normal appearance. She is not ill-appearing.  HENT:     Head: Normocephalic and atraumatic.  Cardiovascular:     Rate and Rhythm: Normal rate and regular rhythm.     Pulses: Normal pulses.     Heart sounds:  Normal heart sounds. No murmur heard.    No friction rub. No gallop.  Pulmonary:     Effort: Pulmonary effort is normal. No respiratory distress.     Breath sounds: Normal breath sounds. No wheezing.  Skin:    General: Skin is warm and dry.  Neurological:     Mental Status: She is alert and oriented to person, place, and time.  Psychiatric:        Mood and Affect: Mood normal.        Behavior: Behavior normal.        Thought Content: Thought content normal.        Judgment: Judgment normal.    Assessment & Plan   Problem List Items Addressed This Visit       Cardiovascular and Mediastinum   Hypertension   Relevant Medications   lisinopril  (ZESTRIL ) 10 MG tablet   Other Relevant Orders   CBC   CMP14+EGFR   Lipid panel     Other   Anxiety - Primary   Depression   Hyperlipidemia   Relevant Medications   lisinopril  (ZESTRIL ) 10 MG tablet   Other Relevant Orders   CMP14+EGFR   Lipid panel   Other Visit Diagnoses       Gastroesophageal reflux disease, unspecified whether esophagitis present         Encounter for screening mammogram for malignant neoplasm of breast       Relevant Orders   MM DIGITAL SCREENING BILATERAL     Alcohol abuse  Assessment and Plan    Alcohol use disorder Increased alcohol consumption with family history of cirrhosis. Eligible for naltrexone due to no pain medication use. - Prescribed naltrexone 50 mg once daily. - Advised to reduce alcohol consumption while on naltrexone.  Chronic foot pain Chronic foot pain with partial relief from meloxicam  and previous injections. - Continue meloxicam  as needed. - Consider Celebrex  200 mg twice daily if meloxicam  is ineffective.  Obesity with fatty liver disease, lumbar disc disorder, essential hypertension, and mixed hyperlipidemia Obesity with comorbidities. Discussed weight loss injectables and insurance coverage issues. Emphasized weight loss benefits on joint pain and health. - Submit  order for weight loss injectable to check insurance coverage. - Consider compounded version if not covered by insurance.  Depression and anxiety disorder Mood well-managed with Cymbalta  and Wellbutrin . Discussed long-term safety of Wellbutrin  and adjusted Cymbalta  dosage. - Continue Cymbalta  60 mg twice daily. - Continue Wellbutrin  XL 300 mg daily as prescribed.  Gastroesophageal reflux disease (GERD) GERD symptoms well-managed with omeprazole . - Continue omeprazole  as prescribed.  Screening mammogram Due for screening mammogram. Agreed to schedule. - Order screening mammogram.     Follow up   Return in about 4 weeks (around 04/07/2024) for Naltraxone follow up. __________________________________ Zada FREDRIK Palin, DNP, APRN, FNP-BC Primary Care and Sports Medicine [facility]

## 2024-03-11 ENCOUNTER — Ambulatory Visit: Payer: Self-pay | Admitting: Medical-Surgical

## 2024-03-11 ENCOUNTER — Other Ambulatory Visit: Payer: Self-pay | Admitting: Medical-Surgical

## 2024-03-11 DIAGNOSIS — K219 Gastro-esophageal reflux disease without esophagitis: Secondary | ICD-10-CM

## 2024-03-11 DIAGNOSIS — I10 Essential (primary) hypertension: Secondary | ICD-10-CM

## 2024-03-11 DIAGNOSIS — E782 Mixed hyperlipidemia: Secondary | ICD-10-CM

## 2024-03-11 DIAGNOSIS — F1010 Alcohol abuse, uncomplicated: Secondary | ICD-10-CM | POA: Insufficient documentation

## 2024-03-11 LAB — LIPID PANEL
Chol/HDL Ratio: 2.5 ratio (ref 0.0–4.4)
Cholesterol, Total: 223 mg/dL — ABNORMAL HIGH (ref 100–199)
HDL: 90 mg/dL (ref >39)
LDL Chol Calc (NIH): 122 mg/dL — ABNORMAL HIGH (ref 0–99)
Triglycerides: 64 mg/dL (ref 0–149)
VLDL Cholesterol Cal: 11 mg/dL (ref 5–40)

## 2024-03-11 LAB — CBC
Hematocrit: 45 % (ref 34.0–46.6)
Hemoglobin: 13.9 g/dL (ref 11.1–15.9)
MCH: 30.3 pg (ref 26.6–33.0)
MCHC: 30.9 g/dL — ABNORMAL LOW (ref 31.5–35.7)
MCV: 98 fL — ABNORMAL HIGH (ref 79–97)
Platelets: 233 x10E3/uL (ref 150–450)
RBC: 4.58 x10E6/uL (ref 3.77–5.28)
RDW: 13 % (ref 11.7–15.4)
WBC: 4.7 x10E3/uL (ref 3.4–10.8)

## 2024-03-11 LAB — CMP14+EGFR
ALT: 24 IU/L (ref 0–32)
AST: 32 IU/L (ref 0–40)
Albumin: 4 g/dL (ref 3.9–4.9)
Alkaline Phosphatase: 94 IU/L (ref 49–135)
BUN/Creatinine Ratio: 22 (ref 12–28)
BUN: 18 mg/dL (ref 8–27)
Bilirubin Total: 0.5 mg/dL (ref 0.0–1.2)
CO2: 24 mmol/L (ref 20–29)
Calcium: 9.1 mg/dL (ref 8.7–10.3)
Chloride: 100 mmol/L (ref 96–106)
Creatinine, Ser: 0.81 mg/dL (ref 0.57–1.00)
Globulin, Total: 2.3 g/dL (ref 1.5–4.5)
Glucose: 86 mg/dL (ref 70–99)
Potassium: 4.8 mmol/L (ref 3.5–5.2)
Sodium: 138 mmol/L (ref 134–144)
Total Protein: 6.3 g/dL (ref 6.0–8.5)
eGFR: 79 mL/min/1.73 (ref >59)

## 2024-03-11 MED ORDER — ATORVASTATIN CALCIUM 20 MG PO TABS: 20.0000 mg | ORAL_TABLET | Freq: Every day | ORAL | 3 refills | Status: AC

## 2024-03-11 MED ORDER — WEGOVY 0.25 MG/0.5ML ~~LOC~~ SOAJ: 0.2500 mg | SUBCUTANEOUS | 0 refills | Status: DC

## 2024-03-11 NOTE — Assessment & Plan Note (Signed)
 GERD symptoms well-managed with omeprazole . - Continue omeprazole  as prescribed.

## 2024-03-11 NOTE — Assessment & Plan Note (Signed)
 Increased alcohol consumption with family history of cirrhosis. Eligible for naltrexone due to no narcotic use. - Prescribed naltrexone 50 mg once daily. - Advised to reduce/quit alcohol consumption while on naltrexone.

## 2024-03-11 NOTE — Assessment & Plan Note (Signed)
 Morbid obesity with multiple comorbidities.  Discussed weight loss options.  Physical activity limited by severe bilateral foot pain.  Dietary modifications have been unsuccessful in helping with weight loss.  Patient has tried dietary and lifestyle modifications, including caloric restrictions, weight loss behavior, and modifications for more than 6 months. The patient has been unable to lose 5% of their body weight. It is medically necessary for the patient to add pharmacotherapy to their obesity treatment plan at this time. The patient will continue lifestyle modifications, physical activity as tolerated, and caloric restrictions while on therapy. The current BMI is 39.86 and body weight is 225 lbs prior to starting medication to treat their obesity. Pharmacotherapy is recommended following AACE guidelines. Patient is not taking another GLP-1 receptor agonist and is not pregnant/lactating. There are no contraindications.

## 2024-03-11 NOTE — Assessment & Plan Note (Signed)
 Blood pressure stable and well-controlled on lisinopril  10 mg daily.  Occasional monitoring at home with results at goal.  Cardiopulmonary exam normal and no concerning symptoms. - Continue lisinopril  10 mg daily. - Updating labs today. - Continue home monitoring with a goal of 130/80 or less. - Recommend a low-sodium diet.

## 2024-03-11 NOTE — Assessment & Plan Note (Signed)
 Mood fairly well-managed with Cymbalta  and Wellbutrin . Discussed long-term safety of Wellbutrin  and adjusted Cymbalta  dosage. - Increase Cymbalta  to 60 mg twice daily. - Continue Wellbutrin  XL 300 mg daily as prescribed.

## 2024-03-11 NOTE — Assessment & Plan Note (Signed)
 Chronic foot pain with partial relief from meloxicam  and previous injections. - Continue meloxicam  as needed. - Okay to alternate with Celebrex  but avoid taking these on the same day. - Continue Lyrica  150 mg 3 times daily.

## 2024-03-11 NOTE — Assessment & Plan Note (Signed)
 Doing well on Lipitor 10 mg daily.  Due for update of lipid panel. - Continue Lipitor 10 mg daily. - Check lipid panel today. - Recommend a low-fat heart healthy diet with physical activity as tolerated and a goal for weight loss to a healthy weight.

## 2024-03-13 ENCOUNTER — Encounter: Payer: Self-pay | Admitting: Dermatology

## 2024-03-13 ENCOUNTER — Other Ambulatory Visit: Payer: Self-pay | Admitting: Medical-Surgical

## 2024-03-13 DIAGNOSIS — Z780 Asymptomatic menopausal state: Secondary | ICD-10-CM

## 2024-03-13 DIAGNOSIS — F419 Anxiety disorder, unspecified: Secondary | ICD-10-CM

## 2024-03-13 MED ORDER — DULOXETINE HCL 60 MG PO CPEP
60.0000 mg | ORAL_CAPSULE | Freq: Two times a day (BID) | ORAL | 1 refills | Status: AC
Start: 2024-03-13 — End: ?

## 2024-03-17 NOTE — Telephone Encounter (Signed)
 A prior authorization is required for [facility] rx. Thanks in advance.

## 2024-03-18 ENCOUNTER — Other Ambulatory Visit (HOSPITAL_COMMUNITY): Payer: Self-pay

## 2024-03-18 ENCOUNTER — Encounter: Payer: Self-pay | Admitting: Medical-Surgical

## 2024-03-18 ENCOUNTER — Telehealth: Payer: Self-pay

## 2024-03-18 ENCOUNTER — Ambulatory Visit: Admitting: Dermatology

## 2024-03-18 VITALS — BP 124/76 | HR 91

## 2024-03-18 DIAGNOSIS — C4491 Basal cell carcinoma of skin, unspecified: Secondary | ICD-10-CM

## 2024-03-18 DIAGNOSIS — C44519 Basal cell carcinoma of skin of other part of trunk: Secondary | ICD-10-CM | POA: Diagnosis not present

## 2024-03-18 NOTE — Patient Instructions (Signed)

## 2024-03-18 NOTE — Telephone Encounter (Signed)
 Copied from CRM #[id number]. Topic: General - Call Back - No Documentation >> Mar 18, 2024  1:34 PM Geneva B wrote: Reason for CRM: patient called missed call from [facility] please call pt back [phone]

## 2024-03-18 NOTE — Telephone Encounter (Signed)
 Attempted call to patient to see what injection she  if referring to .  In chart shows that she was informed via Zada Palin, NP that Georjean would not be approved even with a letter written.  Left a voice mail message requesting a return call.

## 2024-03-18 NOTE — Telephone Encounter (Signed)
 Pt notified via MyChart.

## 2024-03-18 NOTE — Telephone Encounter (Signed)
 I have spoken with the patient and a message has been forwarded to Zada palin , NP for review in separate message.

## 2024-03-18 NOTE — Telephone Encounter (Signed)
 Pharmacy Patient Advocate Encounter   Received notification from RX Request Messages that prior authorization for [facility] 0.25mg /0.74ml is required/requested.   Insurance verification completed.   The patient is insured through [facility] ADVANTAGE/RX ADVANCE.   Per test claim: Per test claim, medication is not covered due to plan/benefit exclusion, PA not submitted at this time

## 2024-03-18 NOTE — Telephone Encounter (Signed)
 Spoke with patient. She states she knows that the [facility] is not covered but [name] had mentioned a medication for weight loss that her husband was on that was less than $200 per month  and she was inquiring about this .

## 2024-03-18 NOTE — Progress Notes (Unsigned)
   Follow-Up Visit   Subjective  [name] is a 68 y.o. female who presents for the following: Excision of left upper back  The following portions of the chart were reviewed this encounter and updated as appropriate: medications, allergies, medical history  Review of Systems:  No other skin or systemic complaints except as noted in HPI or Assessment and Plan.  Objective  Well appearing patient in no apparent distress; mood and affect are within normal limits.  A focused examination was performed of the following areas: Left upper back Relevant physical exam findings are noted in the Assessment and Plan.     Assessment & Plan      No follow-ups on file.  I, Darice Smock, CMA, am acting as scribe for [name], MD.   Documentation: I have reviewed the above documentation for accuracy and completeness, and I agree with the above.  [name], MD

## 2024-03-19 ENCOUNTER — Encounter: Payer: Self-pay | Admitting: Dermatology

## 2024-03-19 MED ORDER — TIRZEPATIDE 10 MG/0.5ML ~~LOC~~ SOAJ: SUBCUTANEOUS | 33 refills | Status: DC

## 2024-03-19 NOTE — Telephone Encounter (Signed)
 Spoke with patient - she is requesting to have this prescription sent to pharmacy . I told her that I would contact her to let her know what pharmacy this is sent to . [facility] through [facility]?

## 2024-03-19 NOTE — Addendum Note (Signed)
 Addended byBETHA WILLO MINI on: 03/19/2024 12:56 PM   Modules accepted: Orders

## 2024-03-20 ENCOUNTER — Other Ambulatory Visit: Payer: Self-pay | Admitting: Medical-Surgical

## 2024-03-20 ENCOUNTER — Ambulatory Visit (HOSPITAL_BASED_OUTPATIENT_CLINIC_OR_DEPARTMENT_OTHER)
Admission: RE | Admit: 2024-03-20 | Discharge: 2024-03-20 | Disposition: A | Discharge status: Home / Self Care | Source: Ambulatory Visit | Attending: Medical-Surgical | Admitting: Medical-Surgical

## 2024-03-20 ENCOUNTER — Encounter (HOSPITAL_BASED_OUTPATIENT_CLINIC_OR_DEPARTMENT_OTHER): Payer: Self-pay

## 2024-03-20 DIAGNOSIS — E782 Mixed hyperlipidemia: Secondary | ICD-10-CM

## 2024-03-20 DIAGNOSIS — F1010 Alcohol abuse, uncomplicated: Secondary | ICD-10-CM

## 2024-03-20 DIAGNOSIS — I10 Essential (primary) hypertension: Secondary | ICD-10-CM

## 2024-03-20 DIAGNOSIS — F3341 Major depressive disorder, recurrent, in partial remission: Secondary | ICD-10-CM

## 2024-03-20 DIAGNOSIS — Z1231 Encounter for screening mammogram for malignant neoplasm of breast: Secondary | ICD-10-CM | POA: Insufficient documentation

## 2024-03-20 DIAGNOSIS — K219 Gastro-esophageal reflux disease without esophagitis: Secondary | ICD-10-CM

## 2024-03-20 DIAGNOSIS — F419 Anxiety disorder, unspecified: Secondary | ICD-10-CM

## 2024-03-20 DIAGNOSIS — M19072 Primary osteoarthritis, left ankle and foot: Secondary | ICD-10-CM

## 2024-03-20 LAB — SURGICAL PATHOLOGY

## 2024-03-20 NOTE — Progress Notes (Signed)
 [name]                                          MRN: [id number]   03/20/2024   The [facility] Quality Team Specialist reviewed this patient medical record for the purposes of chart review for care gap closure. The following were reviewed: chart review for care gap closure-controlling blood pressure.    [facility] Quality Team

## 2024-03-21 ENCOUNTER — Ambulatory Visit: Payer: Self-pay | Admitting: Dermatology

## 2024-03-21 DIAGNOSIS — H43813 Vitreous degeneration, bilateral: Secondary | ICD-10-CM | POA: Diagnosis not present

## 2024-03-21 DIAGNOSIS — H353132 Nonexudative age-related macular degeneration, bilateral, intermediate dry stage: Secondary | ICD-10-CM | POA: Diagnosis not present

## 2024-03-21 DIAGNOSIS — H35371 Puckering of macula, right eye: Secondary | ICD-10-CM | POA: Diagnosis not present

## 2024-03-21 DIAGNOSIS — H2513 Age-related nuclear cataract, bilateral: Secondary | ICD-10-CM | POA: Diagnosis not present

## 2024-03-24 ENCOUNTER — Ambulatory Visit: Payer: Self-pay | Admitting: Medical-Surgical

## 2024-03-31 ENCOUNTER — Encounter: Payer: Self-pay | Admitting: Medical-Surgical

## 2024-03-31 ENCOUNTER — Ambulatory Visit: Admitting: Medical-Surgical

## 2024-03-31 VITALS — BP 116/71 | HR 83 | Resp 20 | Ht 63.0 in | Wt 226.0 lb

## 2024-03-31 DIAGNOSIS — F3341 Major depressive disorder, recurrent, in partial remission: Secondary | ICD-10-CM | POA: Diagnosis not present

## 2024-03-31 DIAGNOSIS — M19072 Primary osteoarthritis, left ankle and foot: Secondary | ICD-10-CM | POA: Diagnosis not present

## 2024-03-31 DIAGNOSIS — F1010 Alcohol abuse, uncomplicated: Secondary | ICD-10-CM

## 2024-03-31 DIAGNOSIS — F419 Anxiety disorder, unspecified: Secondary | ICD-10-CM

## 2024-03-31 DIAGNOSIS — M19071 Primary osteoarthritis, right ankle and foot: Secondary | ICD-10-CM

## 2024-03-31 DIAGNOSIS — K219 Gastro-esophageal reflux disease without esophagitis: Secondary | ICD-10-CM

## 2024-03-31 MED ORDER — OMEPRAZOLE 40 MG PO CPDR: DELAYED_RELEASE_CAPSULE | ORAL | 3 refills | Status: AC

## 2024-03-31 MED ORDER — BUPROPION HCL ER (XL) 300 MG PO TB24: 300.0000 mg | ORAL_TABLET | Freq: Every day | ORAL | 3 refills | Status: AC

## 2024-03-31 NOTE — Progress Notes (Signed)
        Established patient visit   History of Present Illness   Discussed the use of AI scribe software for clinical note transcription with the patient, who gave verbal consent to proceed.  History of Present Illness   [name] is a 68 year old female who presents for follow-up on alcohol cessation and medication management.  Alcohol use disorder and cessation - No alcohol consumption for five weeks - Naltrexone therapy ongoing at 50mg  daily and well tolerated - One pound weight gain since cessation of alcohol - No adverse effects or issues with naltrexone - Admits to no desire to drink while on the medication  Bilateral foot pain - Foot pain persists despite medication - Switched back to meloxicam  from Celebrex  for improved pain relief - Continues to experience breakthrough flares of foot pain but Meloxicam  is helping    Physical Exam   Physical Exam Vitals reviewed.  Constitutional:      General: She is not in acute distress.    Appearance: Normal appearance. She is obese. She is not ill-appearing.  HENT:     Head: Normocephalic and atraumatic.  Cardiovascular:     Rate and Rhythm: Normal rate and regular rhythm.     Pulses: Normal pulses.     Heart sounds: Normal heart sounds. No murmur heard.    No friction rub. No gallop.  Pulmonary:     Effort: Pulmonary effort is normal. No respiratory distress.     Breath sounds: Normal breath sounds. No wheezing.  Skin:    General: Skin is warm and dry.  Neurological:     Mental Status: She is alert and oriented to person, place, and time.  Psychiatric:        Mood and Affect: Mood normal.        Behavior: Behavior normal.        Thought Content: Thought content normal.        Judgment: Judgment normal.    Assessment & Plan     Alcohol abuse Well-managed with naltrexone 50mg  daily, reducing alcohol consumption. Satisfied with treatment. - Continue naltrexone daily as prescribed. - Continue to avoid  alcohol consumption.  OA of both feet and ankles Managed with meloxicam , more effective than Celebrex . Experiences breakthrough flares but finds meloxicam  beneficial. - Continue meloxicam  for foot pain management.  Anxiety/Recurrent major depressive disorder, in partial remission Doing well on duloxetine  50mg  BID and Wellbutrin  XL 300mg  daily. Well tolerated and effective. Mood stable, no SI/HI. - continue duloxetine  and Wellbutrin  as prescribed.  GERD Doing well on Omeprazole  40mg  daily long-term and famotidine  40mg  nightly as needed. No concerning symptoms or medication SE/intolerances. - Continue Omeprazole  and famotidine  as prescribed.    Follow up   Return in about 6 months (around 09/28/2024) for chronic disease follow up. __________________________________ Zada FREDRIK Palin, DNP, APRN, FNP-BC Primary Care and Sports Medicine [facility]

## 2024-04-04 DIAGNOSIS — H353132 Nonexudative age-related macular degeneration, bilateral, intermediate dry stage: Secondary | ICD-10-CM | POA: Diagnosis not present

## 2024-04-10 ENCOUNTER — Ambulatory Visit: Admitting: Dermatology

## 2024-04-10 ENCOUNTER — Other Ambulatory Visit: Payer: Self-pay | Admitting: Medical-Surgical

## 2024-04-10 ENCOUNTER — Encounter: Payer: Self-pay | Admitting: Dermatology

## 2024-04-10 VITALS — Temp 97.7°F

## 2024-04-10 DIAGNOSIS — Z85828 Personal history of other malignant neoplasm of skin: Secondary | ICD-10-CM | POA: Diagnosis not present

## 2024-04-10 DIAGNOSIS — L578 Other skin changes due to chronic exposure to nonionizing radiation: Secondary | ICD-10-CM | POA: Diagnosis not present

## 2024-04-10 DIAGNOSIS — L570 Actinic keratosis: Secondary | ICD-10-CM

## 2024-04-10 DIAGNOSIS — L814 Other melanin hyperpigmentation: Secondary | ICD-10-CM

## 2024-04-10 DIAGNOSIS — W908XXA Exposure to other nonionizing radiation, initial encounter: Secondary | ICD-10-CM | POA: Diagnosis not present

## 2024-04-10 DIAGNOSIS — D489 Neoplasm of uncertain behavior, unspecified: Secondary | ICD-10-CM

## 2024-04-10 DIAGNOSIS — D1801 Hemangioma of skin and subcutaneous tissue: Secondary | ICD-10-CM | POA: Diagnosis not present

## 2024-04-10 DIAGNOSIS — D229 Melanocytic nevi, unspecified: Secondary | ICD-10-CM

## 2024-04-10 DIAGNOSIS — L821 Other seborrheic keratosis: Secondary | ICD-10-CM

## 2024-04-10 DIAGNOSIS — Z1283 Encounter for screening for malignant neoplasm of skin: Secondary | ICD-10-CM

## 2024-04-10 DIAGNOSIS — C44519 Basal cell carcinoma of skin of other part of trunk: Secondary | ICD-10-CM

## 2024-04-10 DIAGNOSIS — L905 Scar conditions and fibrosis of skin: Secondary | ICD-10-CM

## 2024-04-10 NOTE — Progress Notes (Signed)
 Total Body Skin Exam (TBSE) Visit   History of Present Illness [name] is a 68 year old female who presents for a skin check and evaluation of concerning spots on her face and body.  She is concerned about a persistent itchy spot on her face that sometimes bleeds, as well as a spot on her chest that feels unusual. She has several spots on her face and body that worry her, including one in her eyebrow and several on her forehead, cheek, and jawline.  She describes a one centimeter pink scaly plaque on her back that she could feel but was not visible during her last visit. Her husband also did not notice it previously. She experiences itchiness on her back and face.  She has a history of shoulder replacement surgery in June, which was very successful after four previous surgeries on the same shoulder. She also mentions having foot problems and sometimes wears compression stockings.   Patient presents today for follow up visit for TBSE. Patient was last evaluated on 03/21/24 . Patient denies medication changes. Patient reports she does not have spots, moles and lesions of concern to be evaluated. Patient reports throughout her lifetime she has had moderate sun exposure. Currently, patient reports if she has excessive sun exposure, she does apply sunscreen and/or wears protective coverings. Patient reports she has hx of bx (BCC- Left Upper Back, BCC Right Upper Back, DN Left Flank, BCC-Right Temple). Patient admits to  family history of skin cancers, patient reports that her self and father had BCC. The patient has spots, moles and lesions to be evaluated, some may be new or changing and the patient has concerns that these could be cancer.  The following portions of the chart were reviewed this encounter and updated as appropriate: medications, allergies, medical history  Review of Systems:  No other skin or systemic complaints except as noted in HPI or Assessment and Plan.  Objective   Well appearing patient in no apparent distress; mood and affect are within normal limits.  A full examination was performed including scalp, head, eyes, ears, nose, lips, neck, chest, axillae, abdomen, back, buttocks, bilateral upper extremities, bilateral lower extremities, hands, feet, fingers, toes, fingernails, and toenails. All findings within normal limits unless otherwise noted below.   Relevant physical exam findings are noted in the Assessment and Plan.  Right Lower Back 1cm pink scaly plaque  Head - Anterior (Face) (7), Left Breast (3), Neck - Anterior, Right Forearm - Posterior, Right Shoulder - Anterior (2) Erythematous thin papules/macules with gritty scale.   Assessment & Plan   LENTIGINES, SEBORRHEIC KERATOSES, HEMANGIOMAS - Benign normal skin lesions - Benign-appearing - Call for any changes  MELANOCYTIC NEVI - Tan-brown and/or pink-flesh-colored symmetric macules and papules - Benign appearing on exam today - Observation - Call clinic for new or changing moles - Recommend daily use of broad spectrum spf 30+ sunscreen to sun-exposed areas.   ACTINIC DAMAGE - Chronic condition, secondary to cumulative UV/sun exposure - diffuse scaly erythematous macules with underlying dyspigmentation - Recommend daily broad spectrum sunscreen SPF 30+ to sun-exposed areas, reapply every 2 hours as needed.  - Staying in the shade or wearing long sleeves, sun glasses (UVA+UVB protection) and wide brim hats (4-inch brim around the entire circumference of the hat) are also recommended for sun protection.  - Call for new or changing lesions.  HISTORY OF BASAL CELL CARCINOMA OF THE SKIN (Left Upper Back, Right Upper Back, Right Temple) - No evidence  of recurrence today - Recommend regular full body skin exams - Recommend daily broad spectrum sunscreen SPF 30+ to sun-exposed areas, reapply every 2 hours as needed.  - Call if any new or changing lesions are noted between office  visits  SKIN CANCER SCREENING PERFORMED TODAY.  HISTORY OF BASAL CELL CARCINOMA OF THE SKIN - No evidence of recurrence today - Recommend regular full body skin exams - Recommend daily broad spectrum sunscreen SPF 30+ to sun-exposed areas, reapply every 2 hours as needed.  - Call if any new or changing lesions are noted between office visits    NEOPLASM OF UNCERTAIN BEHAVIOR Right Lower Back Skin / nail biopsy Type of biopsy: tangential   Informed consent: discussed and consent obtained   Timeout: patient name, date of birth, surgical site, and procedure verified   Procedure prep:  Patient was prepped and draped in usual sterile fashion Prep type:  Isopropyl alcohol Anesthesia: the lesion was anesthetized in a standard fashion   Anesthetic:  1% lidocaine  w/ epinephrine  1-100,000 buffered w/ 8.4% NaHCO3 Instrument used: DermaBlade   Hemostasis achieved with: pressure, aluminum chloride and electrodesiccation   Outcome: patient tolerated procedure well   Post-procedure details: sterile dressing applied and wound care instructions given   Dressing type: bandage and petrolatum    Specimen 1 - Surgical pathology Differential Diagnosis: R/O NMSC  Check Margins: No AK (ACTINIC KERATOSIS) (14) Head - Anterior (Face) (7), Left Breast (3), Neck - Anterior, Right Forearm - Posterior, Right Shoulder - Anterior (2) Destruction of lesion - Head - Anterior (Face) (7), Left Breast (3), Neck - Anterior, Right Forearm - Posterior, Right Shoulder - Anterior (2) Complexity: simple   Destruction method: cryotherapy   Outcome: patient tolerated procedure well with no complications   Post-procedure details: wound care instructions given    Related Medications fluorouracil  (EFUDEX ) 5 % cream Apply topically 2 (two) times daily. ACTINIC SKIN DAMAGE   LENTIGINES   CHERRY ANGIOMA   SEBORRHEIC KERATOSIS   MULTIPLE BENIGN NEVI   HISTORY OF BASAL CELL CARCINOMA   SCAR   Return in  about 6 months (around 10/08/2024) for TBSE follow up.  I, Doyce Pan, CMA, am acting as scribe for [name], MD.   Documentation: I have reviewed the above documentation for accuracy and completeness, and I agree with the above.  [name], MD

## 2024-04-10 NOTE — Patient Instructions (Addendum)
 For areas treated with Liquid Nitrogen:  Keep clean with soap and water.  Apply Vaseline or Aquaphor twice daily.    Patient Handout: Wound Care for Skin Biopsy Site  Taking Care of Your Skin Biopsy Site  Proper care of the biopsy site is essential for promoting healing and minimizing scarring. This handout provides instructions on how to care for your biopsy site to ensure optimal recovery.  1. Cleaning the Wound:  Clean the biopsy site daily with gentle soap and water. Gently pat the area dry with a clean, soft towel. Avoid harsh scrubbing or rubbing the area, as this can irritate the skin and delay healing.  2. Applying Aquaphor and Bandage:  After cleaning the wound, apply a thin layer of Aquaphor ointment to the biopsy site. Cover the area with a sterile bandage to protect it from dirt, bacteria, and friction. Change the bandage daily or as needed if it becomes soiled or wet.  3. Continued Care for One Week:  Repeat the cleaning, Aquaphor application, and bandaging process daily for one week following the biopsy procedure. Keeping the wound clean and moist during this initial healing period will help prevent infection and promote optimal healing.  4. Massaging Aquaphor into the Area:  ---After one week, discontinue the use of bandages but continue to apply Aquaphor to the biopsy site. ----Gently massage the Aquaphor into the area using circular motions. ---Massaging the skin helps to promote circulation and prevent the formation of scar tissue.   Additional Tips:  Avoid exposing the biopsy site to direct sunlight during the healing process, as this can cause hyperpigmentation or worsen scarring. If you experience any signs of infection, such as increased redness, swelling, warmth, or drainage from the wound, contact your healthcare provider immediately. Follow any additional instructions provided by your healthcare provider for caring for the biopsy site and managing  any discomfort. Conclusion:  Taking proper care of your skin biopsy site is crucial for ensuring optimal healing and minimizing scarring. By following these instructions for cleaning, applying Aquaphor, and massaging the area, you can promote a smooth and successful recovery. If you have any questions or concerns about caring for your biopsy site, don't hesitate to contact your healthcare provider for guidance.       Important Information   Due to recent changes in healthcare laws, you may see results of your pathology and/or laboratory studies on MyChart before the doctors have had a chance to review them. We understand that in some cases there may be results that are confusing or concerning to you. Please understand that not all results are received at the same time and often the doctors may need to interpret multiple results in order to provide you with the best plan of care or course of treatment. Therefore, we ask that you please give us  2 business days to thoroughly review all your results before contacting the office for clarification. Should we see a critical lab result, you will be contacted sooner.     If You Need Anything After Your Visit   If you have any questions or concerns for your doctor, please call our main line at [phone]. If no one answers, please leave a voicemail as directed and we will return your call as soon as possible. Messages left after 4 pm will be answered the following business day.    You may also send us  a message via MyChart. We typically respond to MyChart messages within 1-2 business days.  For prescription refills,  please ask your pharmacy to contact our office. Our fax number is [phone].  If you have an urgent issue when the clinic is closed that cannot wait until the next business day, you can page your doctor at the number below.     Please note that while we do our best to be available for urgent issues outside of office hours, we are not  available 24/7.    If you have an urgent issue and are unable to reach us , you may choose to seek medical care at your doctor's office, retail clinic, urgent care center, or emergency room.   If you have a medical emergency, please immediately call 911 or go to the emergency department. In the event of inclement weather, please call our main line at [phone] for an update on the status of any delays or closures.  Dermatology Medication Tips: Please keep the boxes that topical medications come in in order to help keep track of the instructions about where and how to use these. Pharmacies typically print the medication instructions only on the boxes and not directly on the medication tubes.   If your medication is too expensive, please contact our office at [phone] or send us  a message through MyChart.    We are unable to tell what your co-pay for medications will be in advance as this is different depending on your insurance coverage. However, we may be able to find a substitute medication at lower cost or fill out paperwork to get insurance to cover a needed medication.    If a prior authorization is required to get your medication covered by your insurance company, please allow us  1-2 business days to complete this process.   Drug prices often vary depending on where the prescription is filled and some pharmacies may offer cheaper prices.   The website www.goodrx.com contains coupons for medications through different pharmacies. The prices here do not account for what the cost may be with help from insurance (it may be cheaper with your insurance), but the website can give you the price if you did not use any insurance.  - You can print the associated coupon and take it with your prescription to the pharmacy.  - You may also stop by our office during regular business hours and pick up a GoodRx coupon card.  - If you need your prescription sent electronically to a different pharmacy,  notify our office through [facility] or by phone at [phone]

## 2024-04-11 LAB — SURGICAL PATHOLOGY

## 2024-04-13 ENCOUNTER — Ambulatory Visit: Payer: Self-pay | Admitting: Dermatology

## 2024-04-14 NOTE — Progress Notes (Signed)
 Spoke with pt gave her bx results and treatment recommendations

## 2024-04-16 DIAGNOSIS — M19072 Primary osteoarthritis, left ankle and foot: Secondary | ICD-10-CM | POA: Diagnosis not present

## 2024-04-16 DIAGNOSIS — M19071 Primary osteoarthritis, right ankle and foot: Secondary | ICD-10-CM | POA: Diagnosis not present

## 2024-05-14 ENCOUNTER — Encounter: Admitting: Dermatology

## 2024-06-11 ENCOUNTER — Encounter: Payer: Self-pay | Admitting: Medical-Surgical

## 2024-06-11 DIAGNOSIS — G8929 Other chronic pain: Secondary | ICD-10-CM

## 2024-06-11 MED ORDER — MELOXICAM 15 MG PO TABS: 15.0000 mg | ORAL_TABLET | Freq: Every day | ORAL | 3 refills | Status: AC

## 2024-06-23 ENCOUNTER — Encounter: Payer: Self-pay | Admitting: Medical-Surgical

## 2024-06-25 NOTE — Telephone Encounter (Signed)
 Letter in regards to [employer] has been completed and given to provider for signature. Contacted patient and advised letter will be scanned into chart with providers signature once complete.

## 2024-07-01 ENCOUNTER — Encounter: Admitting: Dermatology

## 2024-09-24 ENCOUNTER — Ambulatory Visit: Admitting: Dermatology

## 2024-09-29 ENCOUNTER — Ambulatory Visit: Admitting: Medical-Surgical
